# Patient Record
Sex: Female | Born: 1950 | Race: White | Hispanic: No | Marital: Married | Smoking: Former smoker
Health system: Southern US, Community
[De-identification: ages and names within clinical notes are randomized; demographics above are authoritative.]

## PROBLEM LIST (undated history)

## (undated) DIAGNOSIS — M069 Rheumatoid arthritis, unspecified: Secondary | ICD-10-CM

## (undated) DIAGNOSIS — K5792 Diverticulitis of intestine, part unspecified, without perforation or abscess without bleeding: Secondary | ICD-10-CM

## (undated) DIAGNOSIS — M2550 Pain in unspecified joint: Secondary | ICD-10-CM

## (undated) DIAGNOSIS — E785 Hyperlipidemia, unspecified: Secondary | ICD-10-CM

## (undated) DIAGNOSIS — M1990 Unspecified osteoarthritis, unspecified site: Secondary | ICD-10-CM

## (undated) DIAGNOSIS — I10 Essential (primary) hypertension: Secondary | ICD-10-CM

## (undated) DIAGNOSIS — M25561 Pain in right knee: Secondary | ICD-10-CM

## (undated) HISTORY — DX: Rheumatoid arthritis, unspecified: M06.9

## (undated) HISTORY — PX: TONSILLECTOMY: SUR1361

## (undated) HISTORY — DX: Essential (primary) hypertension: I10

## (undated) HISTORY — PX: MANDIBLE SURGERY: SHX707

## (undated) HISTORY — DX: Hyperlipidemia, unspecified: E78.5

## (undated) HISTORY — PX: TONSILLECTOMY: SHX5217

## (undated) HISTORY — PX: TUBAL LIGATION: SHX77

## (undated) HISTORY — PX: CARPAL TUNNEL RELEASE: SHX101

## (undated) HISTORY — PX: KNEE ARTHROSCOPY: SUR90

## (undated) HISTORY — DX: Pain in right knee: M25.561

## (undated) HISTORY — PX: WRIST FUSION: SHX839

## (undated) HISTORY — PX: OSTEOTOMY PELVIS BILATERAL: SUR985

## (undated) HISTORY — PX: ABDOMINAL HYSTERECTOMY: SHX81

## (undated) HISTORY — PX: KNEE ARTHROPLASTY: SHX992

## (undated) HISTORY — DX: Pain in unspecified joint: M25.50

## (undated) HISTORY — DX: Unspecified osteoarthritis, unspecified site: M19.90

---

## 1998-04-11 ENCOUNTER — Ambulatory Visit (HOSPITAL_COMMUNITY): Admission: RE | Admit: 1998-04-11 | Discharge: 1998-04-11 | Payer: Self-pay | Admitting: Family Medicine

## 1998-04-11 ENCOUNTER — Encounter: Payer: Self-pay | Admitting: Family Medicine

## 1998-07-28 ENCOUNTER — Ambulatory Visit (HOSPITAL_COMMUNITY): Admission: RE | Admit: 1998-07-28 | Discharge: 1998-07-28 | Payer: Self-pay | Admitting: General Surgery

## 1998-07-28 ENCOUNTER — Encounter: Payer: Self-pay | Admitting: General Surgery

## 1998-08-12 ENCOUNTER — Ambulatory Visit (HOSPITAL_COMMUNITY): Admission: RE | Admit: 1998-08-12 | Discharge: 1998-08-12 | Payer: Self-pay | Admitting: Family Medicine

## 1998-08-12 ENCOUNTER — Encounter: Payer: Self-pay | Admitting: Family Medicine

## 1998-10-17 ENCOUNTER — Encounter: Payer: Self-pay | Admitting: Family Medicine

## 1998-10-17 ENCOUNTER — Ambulatory Visit (HOSPITAL_COMMUNITY): Admission: RE | Admit: 1998-10-17 | Discharge: 1998-10-17 | Payer: Self-pay | Admitting: Family Medicine

## 2003-05-30 ENCOUNTER — Ambulatory Visit (HOSPITAL_COMMUNITY): Admission: RE | Admit: 2003-05-30 | Discharge: 2003-05-30 | Payer: Self-pay | Admitting: Family Medicine

## 2003-06-28 ENCOUNTER — Ambulatory Visit (HOSPITAL_COMMUNITY): Admission: RE | Admit: 2003-06-28 | Discharge: 2003-06-28 | Payer: Self-pay | Admitting: Orthopedic Surgery

## 2003-06-28 ENCOUNTER — Ambulatory Visit (HOSPITAL_BASED_OUTPATIENT_CLINIC_OR_DEPARTMENT_OTHER): Admission: RE | Admit: 2003-06-28 | Discharge: 2003-06-28 | Payer: Self-pay | Admitting: Orthopedic Surgery

## 2003-07-12 ENCOUNTER — Encounter: Admission: RE | Admit: 2003-07-12 | Discharge: 2003-09-20 | Payer: Self-pay | Admitting: Orthopedic Surgery

## 2003-09-22 ENCOUNTER — Encounter: Admission: RE | Admit: 2003-09-22 | Discharge: 2003-09-22 | Payer: Self-pay | Admitting: Family Medicine

## 2004-01-12 ENCOUNTER — Ambulatory Visit: Payer: Self-pay | Admitting: Family Medicine

## 2004-02-11 ENCOUNTER — Ambulatory Visit: Payer: Self-pay | Admitting: Family Medicine

## 2004-04-18 ENCOUNTER — Ambulatory Visit: Payer: Self-pay | Admitting: Family Medicine

## 2004-06-20 ENCOUNTER — Ambulatory Visit: Payer: Self-pay | Admitting: Family Medicine

## 2004-10-17 ENCOUNTER — Ambulatory Visit (HOSPITAL_COMMUNITY): Admission: RE | Admit: 2004-10-17 | Discharge: 2004-10-17 | Payer: Self-pay | Admitting: Family Medicine

## 2004-10-19 ENCOUNTER — Ambulatory Visit (HOSPITAL_COMMUNITY): Admission: RE | Admit: 2004-10-19 | Discharge: 2004-10-19 | Payer: Self-pay | Admitting: Family Medicine

## 2004-10-19 ENCOUNTER — Ambulatory Visit: Payer: Self-pay | Admitting: Family Medicine

## 2004-12-26 ENCOUNTER — Encounter: Admission: RE | Admit: 2004-12-26 | Discharge: 2005-01-19 | Payer: Self-pay | Admitting: Orthopedic Surgery

## 2005-03-22 ENCOUNTER — Ambulatory Visit: Payer: Self-pay | Admitting: Family Medicine

## 2006-02-01 ENCOUNTER — Ambulatory Visit: Payer: Self-pay | Admitting: Family Medicine

## 2006-02-01 ENCOUNTER — Ambulatory Visit (HOSPITAL_COMMUNITY): Admission: RE | Admit: 2006-02-01 | Discharge: 2006-02-01 | Payer: Self-pay | Admitting: Family Medicine

## 2009-12-14 ENCOUNTER — Encounter: Admission: RE | Admit: 2009-12-14 | Discharge: 2009-12-14 | Payer: Self-pay | Admitting: Family Medicine

## 2010-01-03 ENCOUNTER — Encounter: Admission: RE | Admit: 2010-01-03 | Discharge: 2010-01-03 | Payer: Self-pay | Admitting: Family Medicine

## 2010-04-04 ENCOUNTER — Other Ambulatory Visit (HOSPITAL_COMMUNITY): Payer: Self-pay | Admitting: Family Medicine

## 2010-04-04 DIAGNOSIS — M542 Cervicalgia: Secondary | ICD-10-CM

## 2010-04-12 ENCOUNTER — Ambulatory Visit (HOSPITAL_COMMUNITY)
Admission: RE | Admit: 2010-04-12 | Discharge: 2010-04-12 | Disposition: A | Discharge status: Home / Self Care | Payer: BC Managed Care – PPO | Source: Ambulatory Visit | Attending: Family Medicine | Admitting: Family Medicine

## 2010-04-12 DIAGNOSIS — R29898 Other symptoms and signs involving the musculoskeletal system: Secondary | ICD-10-CM | POA: Insufficient documentation

## 2010-04-12 DIAGNOSIS — R209 Unspecified disturbances of skin sensation: Secondary | ICD-10-CM | POA: Insufficient documentation

## 2010-04-12 DIAGNOSIS — J984 Other disorders of lung: Secondary | ICD-10-CM | POA: Insufficient documentation

## 2010-04-12 DIAGNOSIS — M47812 Spondylosis without myelopathy or radiculopathy, cervical region: Secondary | ICD-10-CM | POA: Insufficient documentation

## 2010-04-12 DIAGNOSIS — M542 Cervicalgia: Secondary | ICD-10-CM

## 2010-06-07 ENCOUNTER — Other Ambulatory Visit: Payer: Self-pay | Admitting: Family Medicine

## 2010-06-07 DIAGNOSIS — Z09 Encounter for follow-up examination after completed treatment for conditions other than malignant neoplasm: Secondary | ICD-10-CM

## 2010-06-23 NOTE — Op Note (Signed)
NAME:  Kiara Miller, Kiara Miller                          ACCOUNT NO.:  [id number]   MEDICAL RECORD NO.:  [id number]                   PATIENT TYPE:  AMB   LOCATION:  [facility]                                 FACILITY:  [facility]   PHYSICIAN:  Georges Lynch. Gioffre, M.D.             DATE OF BIRTH:  October 13, 1950   DATE OF PROCEDURE:  06/28/2003  DATE OF DISCHARGE:                                 OPERATIVE REPORT   PREOPERATIVE DIAGNOSES:  1. Degenerative arthritis to the medial compartment of the right knee.  2. Tear of the posterior horn medial meniscus, right knee.   POSTOPERATIVE DIAGNOSES:  1. Degenerative arthritis to the medial compartment of the right knee.  2. Tear of the posterior horn medial meniscus, right knee.   OPERATION:  1. Synovectomy, suprapatellar pouch -- right knee.  2. Diagnostic arthroscopy -- right knee.  3. Medial meniscectomy, posterior horn -- right knee.  4. Abrasion chondroplasty and medial femoral condyle -- right knee.   SURGEON:  Georges Lynch. Darrelyn Hillock, M.D.   ASSISTANT:  Nurse.   DESCRIPTION OF PROCEDURE:  Under local anesthesia with standby, routine  orthopedic prepping and draping of the right lower extremity is carried out.  At this time a small punctate incision is made at the suprapatellar pouch.  The inflow cannula was inserted.  The knee was distended with saline.  At  this time, small punctate incision was then made in the lateral joint and  the arthroscope was entered through the lateral approach.  We then did a  complete diagnostic arthroscopy.  I noticed that in the medial compartment  she had significant arthritic changes.  I introduced a shaver suction  device, did abrasion chondroplasty, and also went back in and noted that she  had a chronic synovitis.  I did a partial synovectomy.  Also, she has a tear  of the posterior horn of the medial meniscus.  I did a partial medial  meniscectomy.  After that I went in and looked at the cruciates.  The  cruciates are  fine.  The lateral joint was normal.  The suprapatellar pouch  had rather significant chronic synovitis.  I introduced a shaver suction  device and did a synovectomy.  I thoroughly irrigated out the knee and  removed all the fluid.  I closed all three punctate incisions with 3-0 nylon  suture.  I then injected 20 cc of 0.5% Marcaine with epinephrine into the  knee joint.  Sterile Neosporin dressings were applied.  She had 1 g Ancef  preoperatively.   The patient left the operating room in satisfactory condition.                                               Ronald A. Darrelyn Hillock, M.D.    RAG/MEDQ  D:  06/28/2003  T:  06/28/2003  Job:  [id number]

## 2010-06-28 ENCOUNTER — Ambulatory Visit
Admission: RE | Admit: 2010-06-28 | Discharge: 2010-06-28 | Disposition: A | Discharge status: Home / Self Care | Payer: BC Managed Care – PPO | Source: Ambulatory Visit | Attending: Family Medicine | Admitting: Family Medicine

## 2010-06-28 DIAGNOSIS — Z09 Encounter for follow-up examination after completed treatment for conditions other than malignant neoplasm: Secondary | ICD-10-CM

## 2010-11-24 ENCOUNTER — Other Ambulatory Visit: Payer: Self-pay | Admitting: Family Medicine

## 2010-11-24 DIAGNOSIS — Z1231 Encounter for screening mammogram for malignant neoplasm of breast: Secondary | ICD-10-CM

## 2010-11-30 ENCOUNTER — Telehealth: Payer: Self-pay | Admitting: Cardiovascular Disease

## 2010-11-30 NOTE — Telephone Encounter (Signed)
Did move appointment up to 10/30.  Advised if chest pains or worse needs to go to emergency department

## 2010-11-30 NOTE — Telephone Encounter (Signed)
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

## 2010-12-04 ENCOUNTER — Encounter: Payer: Self-pay | Admitting: Cardiovascular Disease

## 2010-12-05 ENCOUNTER — Ambulatory Visit (INDEPENDENT_AMBULATORY_CARE_PROVIDER_SITE_OTHER): Payer: BC Managed Care – PPO | Admitting: Cardiovascular Disease

## 2010-12-05 ENCOUNTER — Encounter: Payer: Self-pay | Admitting: Cardiovascular Disease

## 2010-12-05 VITALS — BP 119/69 | HR 65 | Ht 61.5 in | Wt 189.1 lb

## 2010-12-05 DIAGNOSIS — R0600 Dyspnea, unspecified: Secondary | ICD-10-CM | POA: Insufficient documentation

## 2010-12-05 DIAGNOSIS — R0989 Other specified symptoms and signs involving the circulatory and respiratory systems: Secondary | ICD-10-CM

## 2010-12-05 DIAGNOSIS — R002 Palpitations: Secondary | ICD-10-CM

## 2010-12-05 MED ORDER — LOSARTAN POTASSIUM-HCTZ 100-25 MG PO TABS: 1.0000 | ORAL_TABLET | Freq: Every day | ORAL | Status: AC

## 2010-12-05 NOTE — Assessment & Plan Note (Signed)
She complains of having palpitations when she has checked her heart rate her heart rate only in the 80-100 range. I do not think that she's having any significant arrhythmia such as supraventricular tachycardia or atrial fibrillation.

## 2010-12-05 NOTE — Assessment & Plan Note (Signed)
It was a Kiara Miller presents with episodes of dyspnea on exertion as well as palpitations. At like to get an echocardiogram for further evaluation of her cardiac function.  I think it is also reasonable to do a stress Myoview study. Has a long history of cigarette smoking and may have some ischemic heart disease.  I'll see her back in the office in several months for followup visit.

## 2010-12-05 NOTE — Patient Instructions (Signed)
Your physician wants you to follow-up in: 3 months You will receive a reminder letter in the mail two months in advance. If you don't receive a letter, please call our office to schedule the follow-up appointment.  Your physician has requested that you have a lexiscan myoview. For further information please visit [email or web address] Please follow instruction sheet, as given.   Your physician has requested that you have an echocardiogram. Echocardiography is a painless test that uses sound waves to create images of your heart. It provides your doctor with information about the size and shape of your heart and how well your heart's chambers and valves are working. This procedure takes approximately one hour. There are no restrictions for this procedure.

## 2010-12-05 NOTE — Progress Notes (Signed)
Kiara Miller Date of Birth  17-Aug-1950 [facility] [address]    [address] [place], [place]  [address] [phone]  Fax  [phone]  History of Present Illness:  60 year old female pitch has a history of hypertension. She's been having progressive shortness of breath and generalized fatigue for the past several months. Last week  she had an episode of tachycardia / palpitations.  Is associated with some worsening shortness of breath.  Somewhat worsened with exertion.  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  On occasion she was short of breath but her O2 Sate was 98.%.  In addition, her HR ranges frm 80-100 when she is having the palpitations.   She does routine yard work without significant problems.  Has a history of hyperlipidemia but is intolerant to statins.  Current Outpatient Prescriptions on File Prior to Visit  Medication Sig Dispense Refill  . aspirin 81 MG tablet Take 81 mg by mouth daily.        Marland Kitchen losartan-hydrochlorothiazide (HYZAAR) 100-25 MG per tablet Take 1 tablet by mouth. Taking 1/2 Tablet Once a Day       . metoprolol tartrate (LOPRESSOR) 25 MG tablet Take by mouth 2 (two) times daily. Taking 1/2 Tablet       . montelukast (SINGULAIR) 10 MG tablet Take 10 mg by mouth at bedtime.        . Omega-3 Fatty Acids (FISH OIL) 1200 MG CAPS Take 1,200 mg by mouth 2 (two) times daily.          Allergies  Allergen Reactions  . Statins     Past Medical History  Diagnosis Date  . Hypertension   . Hyperlipidemia   . Joint pain   . Right knee pain   . Polyarthralgia     Past Surgical History  Procedure Date  . Tonsillectomy   . Tubal ligation   . Mandible surgery   . Osteotomy pelvis bilateral   . Carpal tunnel release     History  Smoking status  . Current Everyday Smoker -- 0.5 packs/day  . Types: Cigarettes  Smokeless tobacco  . Not on file    History  Alcohol Use No     Family History  Problem Relation Age of Onset  . Hypertension Father     Reviw of Systems:  Reviewed in the HPI.  All other systems are negative.  Physical Exam: BP 119/69  Pulse 65  Ht 5' 1.5" (1.562 m)  Wt 189 lb 1.9 oz (85.784 kg)  BMI 35.16 kg/m2 The patient is alert and oriented x 3.  The mood and affect are normal.   Skin: warm and dry.  Color is normal.    HEENT:   Pupils carotids. She has no JVD  Lungs: Lungs are clear.   Heart: Regular rate, S1-S2.   Abdomen: Her abdomen is soft. She has good bowel sounds.  Extremities:  No clubbing cyanosis or edema.  Neuro:  Nonfocal     ECG:  Assessment / Plan:

## 2010-12-07 HISTORY — PX: TRANSTHORACIC ECHOCARDIOGRAM: SHX275

## 2010-12-14 ENCOUNTER — Ambulatory Visit (HOSPITAL_COMMUNITY): Payer: BC Managed Care – PPO | Attending: Internal Medicine | Admitting: Radiology

## 2010-12-14 ENCOUNTER — Ambulatory Visit (HOSPITAL_COMMUNITY): Payer: BC Managed Care – PPO | Attending: Internal Medicine

## 2010-12-14 VITALS — Ht 61.0 in | Wt 187.0 lb

## 2010-12-14 DIAGNOSIS — R0989 Other specified symptoms and signs involving the circulatory and respiratory systems: Secondary | ICD-10-CM | POA: Insufficient documentation

## 2010-12-14 DIAGNOSIS — R5383 Other fatigue: Secondary | ICD-10-CM

## 2010-12-14 DIAGNOSIS — I10 Essential (primary) hypertension: Secondary | ICD-10-CM | POA: Insufficient documentation

## 2010-12-14 DIAGNOSIS — R0609 Other forms of dyspnea: Secondary | ICD-10-CM | POA: Insufficient documentation

## 2010-12-14 DIAGNOSIS — R002 Palpitations: Secondary | ICD-10-CM | POA: Insufficient documentation

## 2010-12-14 DIAGNOSIS — R5381 Other malaise: Secondary | ICD-10-CM | POA: Insufficient documentation

## 2010-12-14 DIAGNOSIS — I079 Rheumatic tricuspid valve disease, unspecified: Secondary | ICD-10-CM | POA: Insufficient documentation

## 2010-12-14 DIAGNOSIS — R0602 Shortness of breath: Secondary | ICD-10-CM

## 2010-12-14 DIAGNOSIS — E669 Obesity, unspecified: Secondary | ICD-10-CM | POA: Insufficient documentation

## 2010-12-14 DIAGNOSIS — F17200 Nicotine dependence, unspecified, uncomplicated: Secondary | ICD-10-CM | POA: Insufficient documentation

## 2010-12-14 DIAGNOSIS — R110 Nausea: Secondary | ICD-10-CM | POA: Insufficient documentation

## 2010-12-14 DIAGNOSIS — R55 Syncope and collapse: Secondary | ICD-10-CM | POA: Insufficient documentation

## 2010-12-14 DIAGNOSIS — R0789 Other chest pain: Secondary | ICD-10-CM | POA: Insufficient documentation

## 2010-12-14 DIAGNOSIS — E785 Hyperlipidemia, unspecified: Secondary | ICD-10-CM | POA: Insufficient documentation

## 2010-12-14 DIAGNOSIS — R42 Dizziness and giddiness: Secondary | ICD-10-CM | POA: Insufficient documentation

## 2010-12-14 DIAGNOSIS — R0600 Dyspnea, unspecified: Secondary | ICD-10-CM

## 2010-12-14 DIAGNOSIS — R000 Tachycardia, unspecified: Secondary | ICD-10-CM | POA: Insufficient documentation

## 2010-12-14 MED ORDER — TECHNETIUM TC 99M TETROFOSMIN IV KIT
11.0000 | PACK | Freq: Once | INTRAVENOUS | Status: AC | PRN
  Administered 2010-12-14: 11 via INTRAVENOUS

## 2010-12-14 MED ORDER — REGADENOSON 0.4 MG/5ML IV SOLN
0.4000 mg | Freq: Once | INTRAVENOUS | Status: AC
  Administered 2010-12-14: 0.4 mg via INTRAVENOUS

## 2010-12-14 MED ORDER — TECHNETIUM TC 99M TETROFOSMIN IV KIT
33.0000 | PACK | Freq: Once | INTRAVENOUS | Status: AC | PRN
  Administered 2010-12-14: 33 via INTRAVENOUS

## 2010-12-14 NOTE — Progress Notes (Signed)
[facility] SITE 3 NUCLEAR MED [address] [place] [address] [phone]  Cardiology Nuclear Med Study  Kiara Miller is a 60 y.o. female [id number] [birth year]   Nuclear Med Background Indication for Stress Test:  Evaluation for Ischemia History: 1980's GXT:OK per patient Cardiac Risk Factors: Hypertension, Lipids, Obesity and Smoker  Symptoms:  Chest Pressure with and without Exertion (last episode of chest discomfort was yesterday), Dizziness, DOE/SOB, Fatigue with and without Exertion, Light-Headedness, Nausea, Near Syncope, Palpitations, Rapid HR    Nuclear Pre-Procedure Caffeine/Decaff Intake:  None NPO After: 8:00pm   Lungs:  Clear.  O2 SAT 97% on RA. IV 0.9% NS with Angio Cath:  22g  IV Site: R Antecubital x 1, tolerated well IV Started by:  Irean Hong, RN  Chest Size (in):  42 Cup Size: DD  Height: 5\' 1"  (1.549 m)  Weight:  187 lb (84.823 kg)  BMI:  Body mass index is 35.33 kg/(m^2). Tech Comments:  metoprolol held x 24 hours    Nuclear Med Study 1 or 2 day study: 1 day  Stress Test Type:  Treadmill/Lexiscan  Reading MD: Dietrich Pates, MD  Order Authorizing Provider:  Kristeen Miss, MD  Resting Radionuclide: Technetium 17m Tetrofosmin  Resting Radionuclide Dose: 11.0 mCi   Stress Radionuclide:  Technetium 91m Tetrofosmin  Stress Radionuclide Dose: 33.0 mCi           Stress Protocol Rest HR: 66 Stress HR: 130  Rest BP: 104/51 Stress BP: 119/60  Exercise Time (min): 2:00 METS: n/a   Predicted Max HR: 159 bpm % Max HR: 81.76 bpm Rate Pressure Product: 13244   Dose of Adenosine (mg):  n/a Dose of Lexiscan: 0.4 mg  Dose of Atropine (mg): n/a Dose of Dobutamine: n/a mcg/kg/min (at max HR)  Stress Test Technologist: Smiley Houseman, CMA-N  Nuclear Technologist:  Domenic Polite, CNMT     Rest Procedure:  Myocardial perfusion imaging was performed at rest 45 minutes following the intravenous administration of Technetium 64m  Tetrofosmin.  Rest ECG: Poor R-wave progression.  Stress Procedure:  The patient received IV Lexiscan 0.4 mg over 15-seconds with concurrent low level exercise and then Technetium 79m Tetrofosmin was injected at 30-seconds while the patient continued walking one more minute.  There was nonspecific, rate related, ST-segment depression noted with Lexiscan.  Quantitative spect images were obtained after a 45-minute delay.  Stress ECG: No significant change from baseline ECG  QPS Raw Data Images:  Normal; no motion artifact; normal heart/lung ratio. Stress Images:  Normal homogeneous uptake in all areas of the myocardium. Rest Images:  Normal homogeneous uptake in all areas of the myocardium. Subtraction (SDS):  No evidence of ischemia. Transient Ischemic Dilatation (Normal <1.22):  0.91 Lung/Heart Ratio (Normal <0.45):  0.40  Quantitative Gated Spect Images QGS EDV:  42 ml QGS ESV:  4 ml QGS cine images:  NL LV Function; NL Wall Motion QGS EF: 89%  Impression Exercise Capacity:  Lexiscan with low level exercise. BP Response:  Normal blood pressure response. Clinical Symptoms:  No chest pain. ECG Impression:  No significant ST segment change suggestive of ischemia. Comparison with Prior Nuclear Study: No previous nuclear study performed  Overall Impression:  Normal stress nuclear study. Dietrich Pates

## 2010-12-15 ENCOUNTER — Ambulatory Visit: Payer: BC Managed Care – PPO | Admitting: Cardiovascular Disease

## 2010-12-18 ENCOUNTER — Ambulatory Visit
Admission: RE | Admit: 2010-12-18 | Discharge: 2010-12-18 | Disposition: A | Discharge status: Home / Self Care | Payer: BC Managed Care – PPO | Source: Ambulatory Visit | Attending: Family Medicine | Admitting: Family Medicine

## 2010-12-18 DIAGNOSIS — Z1231 Encounter for screening mammogram for malignant neoplasm of breast: Secondary | ICD-10-CM

## 2011-01-05 ENCOUNTER — Telehealth: Payer: Self-pay | Admitting: Cardiovascular Disease

## 2011-01-05 NOTE — Telephone Encounter (Signed)
LOv,Stress faxed to [facility]/Dr.Leonard Nyland @ [phone]  01/05/11/km

## 2011-02-26 ENCOUNTER — Ambulatory Visit: Payer: BC Managed Care – PPO | Admitting: Cardiovascular Disease

## 2011-12-17 ENCOUNTER — Other Ambulatory Visit: Payer: Self-pay | Admitting: Family Medicine

## 2011-12-17 DIAGNOSIS — Z1231 Encounter for screening mammogram for malignant neoplasm of breast: Secondary | ICD-10-CM

## 2012-01-15 ENCOUNTER — Ambulatory Visit
Admission: RE | Admit: 2012-01-15 | Discharge: 2012-01-15 | Disposition: A | Discharge status: Home / Self Care | Payer: BC Managed Care – PPO | Source: Ambulatory Visit | Attending: Family Medicine | Admitting: Family Medicine

## 2012-01-15 DIAGNOSIS — Z1231 Encounter for screening mammogram for malignant neoplasm of breast: Secondary | ICD-10-CM

## 2012-07-02 ENCOUNTER — Other Ambulatory Visit (HOSPITAL_COMMUNITY): Payer: Self-pay | Admitting: Rheumatology

## 2012-07-02 ENCOUNTER — Ambulatory Visit (HOSPITAL_COMMUNITY)
Admission: RE | Admit: 2012-07-02 | Discharge: 2012-07-02 | Disposition: A | Discharge status: Home / Self Care | Payer: BC Managed Care – PPO | Source: Ambulatory Visit | Attending: Rheumatology | Admitting: Rheumatology

## 2012-07-02 DIAGNOSIS — T50905A Adverse effect of unspecified drugs, medicaments and biological substances, initial encounter: Secondary | ICD-10-CM

## 2012-07-02 DIAGNOSIS — M129 Arthropathy, unspecified: Secondary | ICD-10-CM | POA: Insufficient documentation

## 2012-07-02 DIAGNOSIS — Z79899 Other long term (current) drug therapy: Secondary | ICD-10-CM | POA: Insufficient documentation

## 2012-07-02 DIAGNOSIS — T887XXA Unspecified adverse effect of drug or medicament, initial encounter: Secondary | ICD-10-CM | POA: Insufficient documentation

## 2012-07-02 DIAGNOSIS — I10 Essential (primary) hypertension: Secondary | ICD-10-CM | POA: Insufficient documentation

## 2012-12-23 ENCOUNTER — Other Ambulatory Visit: Payer: Self-pay

## 2012-12-23 DIAGNOSIS — Z1231 Encounter for screening mammogram for malignant neoplasm of breast: Secondary | ICD-10-CM

## 2013-01-23 ENCOUNTER — Ambulatory Visit
Admission: RE | Admit: 2013-01-23 | Discharge: 2013-01-23 | Disposition: A | Discharge status: Home / Self Care | Payer: BC Managed Care – PPO | Source: Ambulatory Visit

## 2013-01-23 DIAGNOSIS — Z1231 Encounter for screening mammogram for malignant neoplasm of breast: Secondary | ICD-10-CM

## 2013-12-28 ENCOUNTER — Other Ambulatory Visit: Payer: Self-pay

## 2013-12-28 DIAGNOSIS — Z1231 Encounter for screening mammogram for malignant neoplasm of breast: Secondary | ICD-10-CM

## 2014-02-16 ENCOUNTER — Ambulatory Visit
Admission: RE | Admit: 2014-02-16 | Discharge: 2014-02-16 | Disposition: A | Discharge status: Home / Self Care | Payer: BLUE CROSS/BLUE SHIELD | Source: Ambulatory Visit

## 2014-02-16 DIAGNOSIS — Z1231 Encounter for screening mammogram for malignant neoplasm of breast: Secondary | ICD-10-CM

## 2014-11-11 ENCOUNTER — Other Ambulatory Visit (HOSPITAL_COMMUNITY): Payer: Self-pay | Admitting: Rheumatology

## 2014-11-11 DIAGNOSIS — M25571 Pain in right ankle and joints of right foot: Secondary | ICD-10-CM

## 2014-11-17 ENCOUNTER — Ambulatory Visit (HOSPITAL_COMMUNITY)
Admission: RE | Admit: 2014-11-17 | Discharge: 2014-11-17 | Disposition: A | Discharge status: Home / Self Care | Payer: Medicare Other | Source: Ambulatory Visit | Attending: Rheumatology | Admitting: Rheumatology

## 2014-11-17 ENCOUNTER — Other Ambulatory Visit (HOSPITAL_COMMUNITY): Payer: Self-pay | Admitting: Rheumatology

## 2014-11-17 DIAGNOSIS — M722 Plantar fascial fibromatosis: Secondary | ICD-10-CM | POA: Insufficient documentation

## 2014-11-17 DIAGNOSIS — M25571 Pain in right ankle and joints of right foot: Secondary | ICD-10-CM | POA: Insufficient documentation

## 2014-11-17 DIAGNOSIS — M19072 Primary osteoarthritis, left ankle and foot: Secondary | ICD-10-CM | POA: Diagnosis not present

## 2014-12-08 ENCOUNTER — Ambulatory Visit: Payer: Medicare Other | Attending: Orthopedic Surgery | Admitting: Physical Therapy

## 2014-12-08 DIAGNOSIS — M25571 Pain in right ankle and joints of right foot: Secondary | ICD-10-CM | POA: Insufficient documentation

## 2014-12-08 DIAGNOSIS — M25572 Pain in left ankle and joints of left foot: Secondary | ICD-10-CM | POA: Diagnosis present

## 2014-12-08 DIAGNOSIS — M25672 Stiffness of left ankle, not elsewhere classified: Secondary | ICD-10-CM | POA: Diagnosis present

## 2014-12-08 DIAGNOSIS — M25472 Effusion, left ankle: Secondary | ICD-10-CM | POA: Diagnosis present

## 2014-12-08 DIAGNOSIS — R6889 Other general symptoms and signs: Secondary | ICD-10-CM | POA: Insufficient documentation

## 2014-12-08 NOTE — Therapy (Signed)
[facility] [address] [place], [place], [address] Phone: [phone]   Fax:  [phone]  Physical Therapy Evaluation  Patient Details  Name: Kiara Miller MRN: [id number] Date of Birth: 1950-04-24 Referring Provider: Toni Arthurs, MD  Encounter Date: 12/08/2014      PT End of Session - 12/08/14 1051    Visit Number 1   Number of Visits 12   Date for PT Re-Evaluation 01/05/15   PT Start Time 1052   PT Stop Time 1138   PT Time Calculation (min) 46 min   Activity Tolerance Patient tolerated treatment well   Behavior During Therapy [facility] for tasks assessed/performed      Past Medical History  Diagnosis Date  . Hypertension   . Hyperlipidemia   . Joint pain   . Right knee pain   . Polyarthralgia     Past Surgical History  Procedure Laterality Date  . Tonsillectomy    . Tubal ligation    . Mandible surgery    . Osteotomy pelvis bilateral    . Carpal tunnel release      There were no vitals filed for this visit.  Visit Diagnosis:  Pain in left ankle  Pain in right ankle  Stiffness of left ankle joint  Activity intolerance  Swelling of left ankle joint      Subjective Assessment - 12/08/14 1046    Subjective Patient had insidious onset of L ankle pain about 10 months ago. Presented as Plantar facitis although toes were huring too. Now the right ankle is starting. She had PF on left last year and it resolved.   Pertinent History autoimmune, thryoid, RA, OA (Rt knee severe)   Limitations Walking   How long can you stand comfortably? not at all   How long can you walk comfortably? not at all   Diagnostic tests MRI shows Plantar fascia of medial cord, sinus tarsi syndrome,  mild degeneration  middle facet of subtalar jt, flexor digitorum tendinosis   Patient Stated Goals Decrease pain, stand longer   Currently in Pain? Yes   Pain Score 3    Pain Location Ankle   Pain Orientation Left;Medial  into plantar fascia  and along medial foot   Pain Descriptors / Indicators Aching;Burning   Pain Radiating Towards toes   Pain Onset More than a month ago   Aggravating Factors  standing and walking   Pain Relieving Factors rest and elevation   Effect of Pain on Daily Activities limited standing and walking   Multiple Pain Sites Yes   Pain Score 5   Pain Location Ankle   Pain Orientation Right;Lateral   Pain Descriptors / Indicators Stabbing   Pain Type Acute pain   Pain Onset More than a month ago   Pain Frequency Constant   Aggravating Factors  same   Pain Relieving Factors same            [facility] PT Assessment - 12/08/14 0001    Assessment   Medical Diagnosis chronic pain of L ankle   Referring Provider Toni Arthurs, MD   Onset Date/Surgical Date 04/07/14   Next MD Visit 4 months   Precautions   Precaution Comments wears brace on left   Balance Screen   Has the patient fallen in the past 6 months No   Has the patient had a decrease in activity level because of a fear of falling?  No   Is the patient reluctant to leave their home because of  a fear of falling?  No   Prior Function   Level of Independence Independent;Other (comment)  SPC for gait   Observation/Other Assessments   Focus on Therapeutic Outcomes (FOTO)  67% limited   Observation/Other Assessments-Edema    Edema Figure 8   Figure 8 Edema   Figure 8 - Right  52 cm   Figure 8 - Left  53 cm   Posture/Postural Control   Posture Comments pronation bil , L> R   ROM / Strength   AROM / PROM / Strength AROM;Strength   AROM   AROM Assessment Site Ankle   Right/Left Ankle Right;Left   Right Ankle Dorsiflexion 5  11   Right Ankle Plantar Flexion 62   Right Ankle Inversion 34   Right Ankle Eversion 30  17   Left Ankle Dorsiflexion -2  11   Left Ankle Plantar Flexion 56   Left Ankle Inversion 22  40   Left Ankle Eversion 24  28   Strength   Overall Strength Comments Left ankle 5/5 except PF unable to raise heel and Inv 5-/5;  L ankle 5/5 except inv 5-/5   Palpation   Palpation comment mild tenderness of L medial great toe; L plantar fascia                   [facility] Adult PT Treatment/Exercise - 12/08/14 0001    Modalities   Modalities Iontophoresis   Iontophoresis   Type of Iontophoresis Dexamethasone   Location Left medial ankle   Dose 4mg /ml   Time 2.5 hour patch                PT Education - 12/08/14 1141    Education provided Yes   Education Details continue with HEP; ice 2-3x/day   Person(s) Educated Patient   Methods Explanation   Comprehension Verbalized understanding          PT Short Term Goals - 12/08/14 1158    PT SHORT TERM GOAL #1   Title ALL STG=LTG           PT Long Term Goals - 12/08/14 1158    PT LONG TERM GOAL #1   Title I with HEP   Time 4   Period Weeks   Status New   PT LONG TERM GOAL #2   Title able to tolerate standing and waking for 30 minutes with reported  minimal pain.   Time 4   Period Weeks   Status New   PT LONG TERM GOAL #3   Title able to ambulate without AD   Time 4   Period Weeks   Status New   PT LONG TERM GOAL #4   Title Improve L ankle DF to 5 degrees or better to help normalize gait   Time 4   Period Weeks   Status New   PT LONG TERM GOAL #5   Title Able to perform single leg heel raise on L   Time 4   Period Weeks   Status New               Plan - 12/08/14 1142    Clinical Impression Statement Patient presents with insidious onset of bil ankle pain L>R starting 10 months limiting her standing and walking tolerance. She presently uses a SPC in the RUE due to pain. She recently acquired orthotics which are helping somewhat.  MRI shows tendinosis and PF on the left as well sinus tarsi. Her R ankle is affected  laterally.   Pt will benefit from skilled therapeutic intervention in order to improve on the following deficits Difficulty walking;Pain;Decreased strength;Increased edema;Postural dysfunction   Rehab  Potential Good   PT Frequency 2x / week   PT Duration 4 weeks   PT Treatment/Interventions ADLs/Self Care Home Management;Cryotherapy;Electrical Stimulation;Iontophoresis 4mg /ml Dexamethasone;Moist Heat;Therapeutic exercise;Gait training;Ultrasound;Neuromuscular re-education;Patient/family education;Manual techniques;Vasopneumatic Device;Passive range of motion   PT Next Visit Plan Assess ionto and continue to L medial ankle; to L med and R lat ankle, STW/Man   PT Home Exercise Plan ankle pumps, inv/ever, continue with toe scrunches, gastroc stretch   Consulted and Agree with Plan of Care Patient          G-Codes - 12-23-14 1203    Functional Assessment Tool Used FOTO = 67% LIMITED   Functional Limitation Mobility: Walking and moving around   Mobility: Walking and Moving Around Current Status [phone]) At least 60 percent but less than 80 percent impaired, limited or restricted   Mobility: Walking and Moving Around Goal Status [phone]) At least 40 percent but less than 60 percent impaired, limited or restricted       Problem List Patient Active Problem List   Diagnosis Date Noted  . Dyspnea 12/05/2010  . Palpitations 12/05/2010    12/07/2010 PT  2014-12-23, 12:10 PM  [facility] [address] [place], [place], [place] Phone: [phone]   Fax:  [phone]  Name: Kiara Miller MRN: [name] Date of Birth: February 13, 1950

## 2014-12-10 ENCOUNTER — Ambulatory Visit: Payer: Medicare Other | Admitting: *Deleted

## 2014-12-10 ENCOUNTER — Encounter: Payer: Self-pay | Admitting: *Deleted

## 2014-12-10 DIAGNOSIS — M25571 Pain in right ankle and joints of right foot: Secondary | ICD-10-CM

## 2014-12-10 DIAGNOSIS — M25472 Effusion, left ankle: Secondary | ICD-10-CM

## 2014-12-10 DIAGNOSIS — M25572 Pain in left ankle and joints of left foot: Secondary | ICD-10-CM | POA: Diagnosis not present

## 2014-12-10 DIAGNOSIS — M25672 Stiffness of left ankle, not elsewhere classified: Secondary | ICD-10-CM

## 2014-12-10 DIAGNOSIS — R6889 Other general symptoms and signs: Secondary | ICD-10-CM

## 2014-12-10 NOTE — Therapy (Signed)
[facility] [address] [place], [place], [address] Phone: [phone]   Fax:  [phone]  Physical Therapy Treatment  Patient Details  Name: Kiara Miller MRN: [id number] Date of Birth: Apr 17, 1950 Referring Provider: Toni Arthurs, MD  Encounter Date: 12/10/2014      PT End of Session - 12/10/14 1004    Visit Number 2   Number of Visits 12   Date for PT Re-Evaluation 01/05/15   PT Start Time 0945   PT Stop Time 1034   PT Time Calculation (min) 49 min      Past Medical History  Diagnosis Date  . Hypertension   . Hyperlipidemia   . Joint pain   . Right knee pain   . Polyarthralgia     Past Surgical History  Procedure Laterality Date  . Tonsillectomy    . Tubal ligation    . Mandible surgery    . Osteotomy pelvis bilateral    . Carpal tunnel release      There were no vitals filed for this visit.  Visit Diagnosis:  Pain in left ankle  Pain in right ankle  Stiffness of left ankle joint  Activity intolerance  Swelling of left ankle joint      Subjective Assessment - 12/10/14 0952    Subjective Patient had insidious onset of L ankle pain about 10 months ago. Presented as Plantar facitis although toes were huring too. Now the right ankle is starting. She had PF on left last year and it resolved.   Pertinent History autoimmune, thryoid, RA, OA (Rt knee severe)   Limitations Walking   How long can you stand comfortably? not at all   How long can you walk comfortably? not at all   Diagnostic tests MRI shows Plantar fascia of medial cord, sinus tarsi syndrome,  mild degeneration  middle facet of subtalar jt, flexor digitorum tendinosis   Patient Stated Goals Decrease pain, stand longer   Currently in Pain? Yes   Pain Score 3   PAIN with standing 8-9/10   Pain Location Ankle   Pain Orientation Left;Medial   Pain Descriptors / Indicators Aching;Burning   Pain Onset More than a month ago   Aggravating Factors   standing and walking   Pain Score 5   Pain Location Ankle   Pain Orientation Right;Lateral   Pain Descriptors / Indicators Stabbing   Pain Type Acute pain   Pain Onset More than a month ago                         [facility] Adult PT Treatment/Exercise - 12/10/14 0001    Modalities   Modalities Iontophoresis;Ultrasound;Office manager Location RT ankle LAT, and LT ankle Medial   Electrical Stimulation Action premod x 15 mins 1-10hz    Electrical Stimulation Goals Pain;Edema   Ultrasound   Ultrasound Location LT medial malleoli and RT Lateral malleoli   Ultrasound Parameters 1.2 w/cm2 x 16 mins   Ultrasound Goals Pain   Iontophoresis   Type of Iontophoresis Dexamethasone   Location Left medial ankle   Dose 56ml 80 MA/min dose   Time 4 hour patch   Vasopneumatic   Number Minutes Vasopneumatic  15 minutes   Vasopnuematic Location  Ankle   Vasopneumatic Pressure Low   Vasopneumatic Temperature  36                  PT Short Term Goals -  12/08/14 1158    PT SHORT TERM GOAL #1   Title ALL STG=LTG           PT Long Term Goals - 12/08/14 1158    PT LONG TERM GOAL #1   Title I with HEP   Time 4   Period Weeks   Status New   PT LONG TERM GOAL #2   Title able to tolerate standing and waking for 30 minutes with reported  minimal pain.   Time 4   Period Weeks   Status New   PT LONG TERM GOAL #3   Title able to ambulate without AD   Time 4   Period Weeks   Status New   PT LONG TERM GOAL #4   Title Improve L ankle DF to 5 degrees or better to help normalize gait   Time 4   Period Weeks   Status New   PT LONG TERM GOAL #5   Title Able to perform single leg heel raise on L   Time 4   Period Weeks   Status New               Problem List Patient Active Problem List   Diagnosis Date Noted  . Dyspnea 12/05/2010  . Palpitations 12/05/2010    Gauge Winski,CHRIS,  PTA 12/10/2014, 12:05 PM  [facility] [address] [place], [place], [address] Phone: [phone]   Fax:  [phone]  Name: Kiara Miller MRN: [id number] Date of Birth: 1951/01/24

## 2014-12-14 ENCOUNTER — Ambulatory Visit: Payer: Medicare Other | Admitting: *Deleted

## 2014-12-14 ENCOUNTER — Encounter: Payer: Self-pay | Admitting: *Deleted

## 2014-12-14 DIAGNOSIS — M25672 Stiffness of left ankle, not elsewhere classified: Secondary | ICD-10-CM

## 2014-12-14 DIAGNOSIS — R6889 Other general symptoms and signs: Secondary | ICD-10-CM

## 2014-12-14 DIAGNOSIS — M25572 Pain in left ankle and joints of left foot: Secondary | ICD-10-CM

## 2014-12-14 DIAGNOSIS — M25472 Effusion, left ankle: Secondary | ICD-10-CM

## 2014-12-14 DIAGNOSIS — M25571 Pain in right ankle and joints of right foot: Secondary | ICD-10-CM

## 2014-12-14 NOTE — Therapy (Signed)
[facility] [address] [place], [place], [address] Phone: [phone]   Fax:  [phone]  Physical Therapy Treatment  Patient Details  Name: Kiara Miller MRN: [id number] Date of Birth: 11-Nov-1950 Referring Provider: Toni Arthurs, MD  Encounter Date: 12/14/2014      PT End of Session - 12/14/14 1147    Visit Number 3   Number of Visits 12   Date for PT Re-Evaluation 01/05/15   PT Start Time 1030   PT Stop Time 1120   PT Time Calculation (min) 50 min      Past Medical History  Diagnosis Date  . Hypertension   . Hyperlipidemia   . Joint pain   . Right knee pain   . Polyarthralgia     Past Surgical History  Procedure Laterality Date  . Tonsillectomy    . Tubal ligation    . Mandible surgery    . Osteotomy pelvis bilateral    . Carpal tunnel release      There were no vitals filed for this visit.  Visit Diagnosis:  Pain in left ankle  Pain in right ankle  Stiffness of left ankle joint  Activity intolerance  Swelling of left ankle joint      Subjective Assessment - 12/14/14 1043    Subjective Patient had insidious onset of L ankle pain about 10 months ago. Presented as Plantar facitis although toes were huring too. Now the right ankle is starting. She had PF on left last year and it resolved.   Pertinent History autoimmune, thryoid, RA, OA (Rt knee severe)   Limitations Walking   How long can you stand comfortably? not at all   How long can you walk comfortably? not at all   Diagnostic tests MRI shows Plantar fascia of medial cord, sinus tarsi syndrome,  mild degeneration  middle facet of subtalar jt, flexor digitorum tendinosis   Patient Stated Goals Decrease pain, stand longer   Currently in Pain? Yes   Pain Score 2   Walking pain 8-9/10   Pain Location Ankle   Pain Orientation Left   Pain Descriptors / Indicators Aching;Burning   Pain Onset More than a month ago   Aggravating Factors  standing and  walking   Pain Relieving Factors rest and elevation   Pain Score 2   Pain Location Ankle   Pain Orientation Right;Lateral   Pain Descriptors / Indicators Aching;Burning   Pain Type Acute pain   Pain Onset More than a month ago   Pain Frequency Constant   Aggravating Factors  same   Pain Relieving Factors same                         [facility] Adult PT Treatment/Exercise - 12/14/14 0001    Modalities   Modalities Iontophoresis;Ultrasound;Office manager Location RT ankle LAT, and LT ankle Medial Premod 80-150hz  x15 mins   Electrical Stimulation Goals Pain;Edema   Ultrasound   Ultrasound Location LT plantar fascia and around medial malleoli and RT foot  lateral malleoli   Ultrasound Parameters 1.2 w/cm2, 3.3 w/cm2 x18 mins, 10 mins LT, 8 mins RT    Ultrasound Goals Pain   Iontophoresis   Type of Iontophoresis Dexamethasone   Location Left medial ankle   Dose 49ml 80 MA/min dose   Time 4 hour patch   Manual Therapy   Manual Therapy Soft tissue mobilization;Myofascial release   Myofascial Release IASTM  and STW to LT plantar fascia and around Medial malleolus                  PT Short Term Goals - 12/08/14 1158    PT SHORT TERM GOAL #1   Title ALL STG=LTG           PT Long Term Goals - 12/08/14 1158    PT LONG TERM GOAL #1   Title I with HEP   Time 4   Period Weeks   Status New   PT LONG TERM GOAL #2   Title able to tolerate standing and waking for 30 minutes with reported  minimal pain.   Time 4   Period Weeks   Status New   PT LONG TERM GOAL #3   Title able to ambulate without AD   Time 4   Period Weeks   Status New   PT LONG TERM GOAL #4   Title Improve L ankle DF to 5 degrees or better to help normalize gait   Time 4   Period Weeks   Status New   PT LONG TERM GOAL #5   Title Able to perform single leg heel raise on L   Time 4   Period Weeks   Status New                Plan - 12/14/14 1148    Clinical Impression Statement Pt did fair with Rx today. She chose not to have Vaso Rx today due to cold makes her toes hurt. She is still very tender around LT medial malleolus and into Plantar Fascia. Her RT Lateral malleolus area was also sore with palpation and had mild swelloing Anterior aspect.. Goals are ongoing   Pt will benefit from skilled therapeutic intervention in order to improve on the following deficits Difficulty walking;Pain;Decreased strength;Increased edema;Postural dysfunction   Rehab Potential Good   PT Frequency 2x / week   PT Duration 4 weeks   PT Treatment/Interventions ADLs/Self Care Home Management;Cryotherapy;Electrical Stimulation;Iontophoresis 4mg /ml Dexamethasone;Moist Heat;Therapeutic exercise;Gait training;Ultrasound;Neuromuscular re-education;Patient/family education;Manual techniques;Vasopneumatic Device;Passive range of motion   PT Next Visit Plan Assess ionto and continue to L medial ankle; to L med and R lat ankle, STW/Man   PT Home Exercise Plan ankle pumps, inv/ever, continue with toe scrunches, gastroc stretch   Consulted and Agree with Plan of Care Patient        Problem List Patient Active Problem List   Diagnosis Date Noted  . Dyspnea 12/05/2010  . Palpitations 12/05/2010    Shakera Ebrahimi,CHRIS, PTA 12/14/2014, 11:56 AM  [facility] [address] [place], [place], [place] Phone: [phone]   Fax:  [phone]  Name: GLYNNIS GAVEL MRN: [name] Date of Birth: Dec 17, 1950

## 2014-12-17 ENCOUNTER — Encounter: Payer: Self-pay | Admitting: *Deleted

## 2014-12-17 ENCOUNTER — Ambulatory Visit: Payer: Medicare Other | Admitting: *Deleted

## 2014-12-17 DIAGNOSIS — M25572 Pain in left ankle and joints of left foot: Secondary | ICD-10-CM

## 2014-12-17 DIAGNOSIS — M25472 Effusion, left ankle: Secondary | ICD-10-CM

## 2014-12-17 DIAGNOSIS — M25672 Stiffness of left ankle, not elsewhere classified: Secondary | ICD-10-CM

## 2014-12-17 DIAGNOSIS — R6889 Other general symptoms and signs: Secondary | ICD-10-CM

## 2014-12-17 DIAGNOSIS — M25571 Pain in right ankle and joints of right foot: Secondary | ICD-10-CM

## 2014-12-17 NOTE — Therapy (Signed)
[facility] [address] [place], [place], [address] Phone: [phone]   Fax:  [phone]  Physical Therapy Treatment  Patient Details  Name: Kiara Miller MRN: [id number] Date of Birth: Jun 29, 1950 Referring Provider: Toni Arthurs, MD  Encounter Date: 12/17/2014      PT End of Session - 12/17/14 1226    Visit Number 4   Number of Visits 12   Date for PT Re-Evaluation 01/05/15   PT Start Time 1030   PT Stop Time 1119   PT Time Calculation (min) 49 min      Past Medical History  Diagnosis Date  . Hypertension   . Hyperlipidemia   . Joint pain   . Right knee pain   . Polyarthralgia     Past Surgical History  Procedure Laterality Date  . Tonsillectomy    . Tubal ligation    . Mandible surgery    . Osteotomy pelvis bilateral    . Carpal tunnel release      There were no vitals filed for this visit.  Visit Diagnosis:  Pain in left ankle  Pain in right ankle  Stiffness of left ankle joint  Activity intolerance  Swelling of left ankle joint      Subjective Assessment - 12/17/14 1028    Subjective Patient had insidious onset of L ankle pain about 10 months ago. Presented as Plantar facitis although toes were huring too. Now the right ankle is starting. She had PF on left last year and it resolved.   Pertinent History autoimmune, thryoid, RA, OA (Rt knee severe)   Limitations Walking   How long can you stand comfortably? not at all   How long can you walk comfortably? not at all   Diagnostic tests MRI shows Plantar fascia of medial cord, sinus tarsi syndrome,  mild degeneration  middle facet of subtalar jt, flexor digitorum tendinosis   Patient Stated Goals Decrease pain, stand longer   Currently in Pain? Yes   Pain Score 3   8-9/10 after standing a little while   Pain Location Ankle   Pain Orientation Left   Pain Descriptors / Indicators Aching;Burning   Pain Onset More than a month ago   Aggravating Factors   standing/walking                         [facility] Adult PT Treatment/Exercise - 12/17/14 0001    Modalities   Modalities Iontophoresis;Ultrasound;Office manager Location RT ankle LAT, and LT ankle Medial Premod 80-150hz  x15 mins   Ultrasound   Ultrasound Location LT medial malleoli and Plantar fascia and RT lateral malleoli   Ultrasound Parameters 1.5 w/cm2 x 18 mins (10 min LT, RT 8 mins)   Iontophoresis   Type of Iontophoresis Dexamethasone   Location Left medial ankle   Dose 38ml 80 MA/min dose   Time 4 hour patch   Vasopneumatic   Number Minutes Vasopneumatic  15 minutes   Vasopnuematic Location  Ankle   Vasopneumatic Pressure Low   Vasopneumatic Temperature  36   Manual Therapy   Manual Therapy Soft tissue mobilization;Myofascial release   Myofascial Release IASTM and STW to LT plantar fascia and around Medial malleolus                  PT Short Term Goals - 12/08/14 1158    PT SHORT TERM GOAL #1   Title ALL STG=LTG  PT Long Term Goals - 12/08/14 1158    PT LONG TERM GOAL #1   Title I with HEP   Time 4   Period Weeks   Status New   PT LONG TERM GOAL #2   Title able to tolerate standing and waking for 30 minutes with reported  minimal pain.   Time 4   Period Weeks   Status New   PT LONG TERM GOAL #3   Title able to ambulate without AD   Time 4   Period Weeks   Status New   PT LONG TERM GOAL #4   Title Improve L ankle DF to 5 degrees or better to help normalize gait   Time 4   Period Weeks   Status New   PT LONG TERM GOAL #5   Title Able to perform single leg heel raise on L   Time 4   Period Weeks   Status New               Plan - 12/17/14 1029    Clinical Impression Statement Pt did fairly well with Rx and was able to tolerate all parts. She still has notable tenderness with palpation around LT ankle medial malleoli and into plantar  fascia. On RT foot, it is very tender still around lateral malleoli. Pt feels a little relief during Rx, but pain continues once WB for a little while. Goals on-going   Pt will benefit from skilled therapeutic intervention in order to improve on the following deficits Difficulty walking;Pain;Decreased strength;Increased edema;Postural dysfunction   Rehab Potential Good   PT Frequency 2x / week   PT Duration 4 weeks   PT Treatment/Interventions ADLs/Self Care Home Management;Cryotherapy;Electrical Stimulation;Iontophoresis 4mg /ml Dexamethasone;Moist Heat;Therapeutic exercise;Gait training;Ultrasound;Neuromuscular re-education;Patient/family education;Manual techniques;Vasopneumatic Device;Passive range of motion   PT Next Visit Plan Assess ionto and continue to L medial ankle; to L med and R lat ankle, STW/Man   PT Home Exercise Plan ankle pumps, inv/ever, continue with toe scrunches, gastroc stretch        Problem List Patient Active Problem List   Diagnosis Date Noted  . Dyspnea 12/05/2010  . Palpitations 12/05/2010    Jeter Tomey,CHRIS, PTA 12/17/2014, 12:40 PM  [facility] [address] [place], [place], [place] Phone: [phone]   Fax:  [phone]  Name: Kiara Miller MRN: [name] Date of Birth: 06/02/1950

## 2014-12-21 ENCOUNTER — Ambulatory Visit: Payer: Medicare Other | Admitting: Physical Therapy

## 2014-12-21 DIAGNOSIS — M25572 Pain in left ankle and joints of left foot: Secondary | ICD-10-CM

## 2014-12-21 NOTE — Therapy (Signed)
[facility] [address] [place], [place], [address] Phone: [phone]   Fax:  [phone]  Physical Therapy Treatment  Patient Details  Name: Kiara Miller MRN: [id number] Date of Birth: [birth year] Referring Provider: Toni Arthurs, MD  Encounter Date: 12/21/2014      PT End of Session - 12/21/14 1034    Visit Number 5   Number of Visits 12   Date for PT Re-Evaluation 01/05/15   PT Start Time 1035   PT Stop Time 1128   PT Time Calculation (min) 53 min   Activity Tolerance Patient tolerated treatment well   Behavior During Therapy [facility] for tasks assessed/performed      Past Medical History  Diagnosis Date  . Hypertension   . Hyperlipidemia   . Joint pain   . Right knee pain   . Polyarthralgia     Past Surgical History  Procedure Laterality Date  . Tonsillectomy    . Tubal ligation    . Mandible surgery    . Osteotomy pelvis bilateral    . Carpal tunnel release      There were no vitals filed for this visit.  Visit Diagnosis:  Pain in left ankle      Subjective Assessment - 12/21/14 1200    Subjective Continues to have increased pain with standing. Does not understand why she is so tender over R medial malleoli. States that she was up on her feet a lot Saturday for housework and "paid for it." Reports if completing toe curls in supine she cannot move L great toe much but in standing the L great toe flexes more.   Pertinent History autoimmune, thryoid, RA, OA (Rt knee severe)   Limitations Walking   How long can you stand comfortably? not at all   How long can you walk comfortably? not at all   Diagnostic tests MRI shows Plantar fascia of medial cord, sinus tarsi syndrome,  mild degeneration  middle facet of subtalar jt, flexor digitorum tendinosis   Patient Stated Goals Decrease pain, stand longer   Currently in Pain? Yes   Pain Score 5    Pain Orientation Right;Left   Pain Onset More than a month ago   Aggravating Factors  Standing, ambulation   Pain Relieving Factors rest            [facility] PT Assessment - 12/21/14 0001    Assessment   Medical Diagnosis chronic pain of L ankle   Onset Date/Surgical Date 04/07/14   Next MD Visit 4 months                     [facility] Adult PT Treatment/Exercise - 12/21/14 0001    Modalities   Modalities Electrical Stimulation;Moist Heat;Ultrasound;Iontophoresis   Moist Heat Therapy   Number Minutes Moist Heat 15 Minutes   Moist Heat Location Ankle   Electrical Stimulation   Electrical Stimulation Location L medial ankle; R lateral ankle   Electrical Stimulation Action Pre-Mod   Electrical Stimulation Parameters 80-150 Hz x15 min   Electrical Stimulation Goals Pain;Edema   Ultrasound   Ultrasound Location L medial ankle/ Plantar fascia; R lateral ankle   Ultrasound Parameters 1.5 w/cm2, 100%, 1 mhz x8 min each   Ultrasound Goals Pain   Iontophoresis   Type of Iontophoresis Dexamethasone   Location Left medial ankle   Dose 2.0 ml   Time x8 min; 2.5 hr patch   Manual Therapy   Manual Therapy Myofascial release   Myofascial  Release Gentle IASTW to L medial ankle/ Plantar Fascia and R lateral ankle in supine                  PT Short Term Goals - 12/08/14 1158    PT SHORT TERM GOAL #1   Title ALL STG=LTG           PT Long Term Goals - 12/08/14 1158    PT LONG TERM GOAL #1   Title I with HEP   Time 4   Period Weeks   Status New   PT LONG TERM GOAL #2   Title able to tolerate standing and waking for 30 minutes with reported  minimal pain.   Time 4   Period Weeks   Status New   PT LONG TERM GOAL #3   Title able to ambulate without AD   Time 4   Period Weeks   Status New   PT LONG TERM GOAL #4   Title Improve L ankle DF to 5 degrees or better to help normalize gait   Time 4   Period Weeks   Status New   PT LONG TERM GOAL #5   Title Able to perform single leg heel raise on L   Time 4   Period Weeks    Status New               Plan - 12/21/14 1152    Clinical Impression Statement Patient tolerated today's treatment fairly well although she continues to have tenderness in the L medial ankle in the area just medial to the Plantar Fascia. Continues to be very tender to palpation around the R lateral maleoli as well. Presented in clinic with inflammation in the L medial ankle towards the L heel. Goals remain on-going at this time secondary to increased pain, decreased L ankle DF, use of AD to ambulate and increased pain standing. Normal modalities response noted following removal of the modalties. Patient reported feeling "okay" after treatment and upon standing stated that she could feel pain increasing.   Pt will benefit from skilled therapeutic intervention in order to improve on the following deficits Difficulty walking;Pain;Decreased strength;Increased edema;Postural dysfunction   Rehab Potential Good   PT Frequency 2x / week   PT Duration 4 weeks   PT Treatment/Interventions ADLs/Self Care Home Management;Cryotherapy;Electrical Stimulation;Iontophoresis 4mg /ml Dexamethasone;Moist Heat;Therapeutic exercise;Gait training;Ultrasound;Neuromuscular re-education;Patient/family education;Manual techniques;Vasopneumatic Device;Passive range of motion   PT Next Visit Plan Assess ionto and continue to L medial ankle; to L med and R lat ankle, STW/Man   PT Home Exercise Plan ankle pumps, inv/ever, continue with toe scrunches, gastroc stretch   Consulted and Agree with Plan of Care Patient        Problem List Patient Active Problem List   Diagnosis Date Noted  . Dyspnea 12/05/2010  . Palpitations 12/05/2010    12/07/2010, PTA 12/21/2014, 12:06 PM  [facility] [address] [place], [place], [place] Phone: [phone]   Fax:  [phone]  Name: Kiara Miller MRN: [name] Date of Birth: [birth year]

## 2014-12-24 ENCOUNTER — Ambulatory Visit: Payer: Medicare Other | Admitting: Physical Therapy

## 2014-12-24 ENCOUNTER — Encounter: Payer: Self-pay | Admitting: Physical Therapy

## 2014-12-24 DIAGNOSIS — M25472 Effusion, left ankle: Secondary | ICD-10-CM

## 2014-12-24 DIAGNOSIS — M25571 Pain in right ankle and joints of right foot: Secondary | ICD-10-CM

## 2014-12-24 DIAGNOSIS — M25672 Stiffness of left ankle, not elsewhere classified: Secondary | ICD-10-CM

## 2014-12-24 DIAGNOSIS — M25572 Pain in left ankle and joints of left foot: Secondary | ICD-10-CM | POA: Diagnosis not present

## 2014-12-24 DIAGNOSIS — R6889 Other general symptoms and signs: Secondary | ICD-10-CM

## 2014-12-24 NOTE — Therapy (Signed)
[facility] [address] [place], [place], [address] Phone: [phone]   Fax:  [phone]  Physical Therapy Treatment  Patient Details  Name: Kiara Miller MRN: [id number] Date of Birth: [birth year] Referring Provider: Toni Arthurs, MD  Encounter Date: 12/24/2014      PT End of Session - 12/24/14 1033    Visit Number 6   Number of Visits 12   Date for PT Re-Evaluation 01/05/15   PT Start Time 1033   PT Stop Time 1130   PT Time Calculation (min) 57 min   Activity Tolerance Patient tolerated treatment well   Behavior During Therapy [facility] for tasks assessed/performed      Past Medical History  Diagnosis Date  . Hypertension   . Hyperlipidemia   . Joint pain   . Right knee pain   . Polyarthralgia     Past Surgical History  Procedure Laterality Date  . Tonsillectomy    . Tubal ligation    . Mandible surgery    . Osteotomy pelvis bilateral    . Carpal tunnel release      There were no vitals filed for this visit.  Visit Diagnosis:  Pain in left ankle  Pain in right ankle  Stiffness of left ankle joint  Activity intolerance  Swelling of left ankle joint      Subjective Assessment - 12/24/14 1030    Subjective Reports that Wednesday she had increased overall joint pain today. Today pain is mainly B feet and R knee.   Pertinent History autoimmune, thryoid, RA, OA (Rt knee severe)   Limitations Walking   How long can you stand comfortably? not at all   How long can you walk comfortably? not at all   Diagnostic tests MRI shows Plantar fascia of medial cord, sinus tarsi syndrome,  mild degeneration  middle facet of subtalar jt, flexor digitorum tendinosis   Patient Stated Goals Decrease pain, stand longer   Currently in Pain? Yes   Pain Score 5    Pain Location Ankle   Pain Orientation Left;Medial   Pain Descriptors / Indicators Cramping;Aching;Pressure   Pain Onset More than a month ago   Pain Score 2   Pain  Location Ankle   Pain Orientation Right;Lateral   Pain Type Acute pain   Pain Onset More than a month ago            [facility] PT Assessment - 12/24/14 0001    Assessment   Medical Diagnosis chronic pain of L ankle   Onset Date/Surgical Date 04/07/14   Next MD Visit 4 months                     [facility] Adult PT Treatment/Exercise - 12/24/14 0001    Modalities   Modalities Electrical Stimulation;Moist Heat;Ultrasound;Iontophoresis   Moist Heat Therapy   Number Minutes Moist Heat 15 Minutes   Moist Heat Location Ankle   Electrical Stimulation   Electrical Stimulation Location L medial ankle; R lateral ankle   Electrical Stimulation Action Pre-Mod   Electrical Stimulation Parameters 80-150 Hz x15 min   Electrical Stimulation Goals Pain;Edema   Ultrasound   Ultrasound Location L medial ankle and R lateral ankle   Ultrasound Parameters 1.5 w/cm2, 100%, 1 mhz x8 min each   Ultrasound Goals Pain   Iontophoresis   Type of Iontophoresis Dexamethasone   Location Left medial ankle   Dose 2.0 ml   Time x8 min; 2.5 hr patch   Manual Therapy  Manual Therapy Myofascial release   Myofascial Release Gentle IASTW to L medial ankle/ Plantar Fascia and R lateral ankle in supine                  PT Short Term Goals - 12/08/14 1158    PT SHORT TERM GOAL #1   Title ALL STG=LTG           PT Long Term Goals - 12/08/14 1158    PT LONG TERM GOAL #1   Title I with HEP   Time 4   Period Weeks   Status New   PT LONG TERM GOAL #2   Title able to tolerate standing and waking for 30 minutes with reported  minimal pain.   Time 4   Period Weeks   Status New   PT LONG TERM GOAL #3   Title able to ambulate without AD   Time 4   Period Weeks   Status New   PT LONG TERM GOAL #4   Title Improve L ankle DF to 5 degrees or better to help normalize gait   Time 4   Period Weeks   Status New   PT LONG TERM GOAL #5   Title Able to perform single leg heel raise on L    Time 4   Period Weeks   Status New               Plan - 12/24/14 1228    Clinical Impression Statement Patient tolerated today's treatment better today and did not allow tickle sensation to effect her as much today. Continues to have tenderness upon palpation around the R lateral malleoli. Continues to present in clinic with inflammaiton towards to the medial L heel area. Goals remain on-going secondary to increased pain, decreased L ankle DF, use of AD to ambulate and increased pain standing. Normal modalities response noted following removal of the modalites.    Pt will benefit from skilled therapeutic intervention in order to improve on the following deficits Difficulty walking;Pain;Decreased strength;Increased edema;Postural dysfunction   Rehab Potential Good   PT Frequency 2x / week   PT Duration 4 weeks   PT Treatment/Interventions ADLs/Self Care Home Management;Cryotherapy;Electrical Stimulation;Iontophoresis 4mg /ml Dexamethasone;Moist Heat;Therapeutic exercise;Gait training;Ultrasound;Neuromuscular re-education;Patient/family education;Manual techniques;Vasopneumatic Device;Passive range of motion   PT Next Visit Plan Assess ionto and continue to L medial ankle; to L med and R lat ankle, STW/Man   PT Home Exercise Plan ankle pumps, inv/ever, continue with toe scrunches, gastroc stretch   Consulted and Agree with Plan of Care Patient        Problem List Patient Active Problem List   Diagnosis Date Noted  . Dyspnea 12/05/2010  . Palpitations 12/05/2010    12/07/2010, PTA 12/24/2014, 12:33 PM  [facility] [address] [place], [place], [place] Phone: [phone]   Fax:  [phone]  Name: Kiara Miller MRN: Kiara Miller Date of Birth: 06/18/1950

## 2014-12-28 ENCOUNTER — Ambulatory Visit: Payer: Medicare Other | Admitting: Physical Therapy

## 2014-12-28 ENCOUNTER — Encounter: Payer: Self-pay | Admitting: Physical Therapy

## 2014-12-28 DIAGNOSIS — M25672 Stiffness of left ankle, not elsewhere classified: Secondary | ICD-10-CM

## 2014-12-28 DIAGNOSIS — R6889 Other general symptoms and signs: Secondary | ICD-10-CM

## 2014-12-28 DIAGNOSIS — M25572 Pain in left ankle and joints of left foot: Secondary | ICD-10-CM

## 2014-12-28 DIAGNOSIS — M25472 Effusion, left ankle: Secondary | ICD-10-CM

## 2014-12-28 DIAGNOSIS — M25571 Pain in right ankle and joints of right foot: Secondary | ICD-10-CM

## 2014-12-28 NOTE — Therapy (Signed)
[facility] [address] [place], [place], [address] Phone: [phone]   Fax:  [phone]  Physical Therapy Treatment  Patient Details  Name: Kiara Miller MRN: [id number] Date of Birth: [birth year] Referring Provider: Toni Arthurs, MD  Encounter Date: 12/28/2014      PT End of Session - 12/28/14 1207    Visit Number 7   Number of Visits 12   Date for PT Re-Evaluation 01/05/15   PT Start Time 1123   PT Stop Time 1210   PT Time Calculation (min) 47 min   Activity Tolerance Patient tolerated treatment well   Behavior During Therapy [facility] for tasks assessed/performed      Past Medical History  Diagnosis Date  . Hypertension   . Hyperlipidemia   . Joint pain   . Right knee pain   . Polyarthralgia     Past Surgical History  Procedure Laterality Date  . Tonsillectomy    . Tubal ligation    . Mandible surgery    . Osteotomy pelvis bilateral    . Carpal tunnel release      There were no vitals filed for this visit.  Visit Diagnosis:  Pain in left ankle  Pain in right ankle  Stiffness of left ankle joint  Activity intolerance  Swelling of left ankle joint      Subjective Assessment - 12/28/14 1204    Subjective Reports that she went to [employer] and rode in cart yesterday and still had increased pain following that trip. "My feet are killing me today."   Pertinent History autoimmune, thryoid, RA, OA (Rt knee severe)   Limitations Walking   How long can you stand comfortably? not at all   How long can you walk comfortably? not at all   Diagnostic tests MRI shows Plantar fascia of medial cord, sinus tarsi syndrome,  mild degeneration  middle facet of subtalar jt, flexor digitorum tendinosis   Patient Stated Goals Decrease pain, stand longer   Currently in Pain? Yes   Pain Score 8    Pain Location Ankle   Pain Orientation Right;Medial   Pain Descriptors / Indicators Aching;Sharp   Pain Onset More than a month ago   Pain Score 3   Pain Location Ankle   Pain Orientation Right;Lateral   Pain Descriptors / Indicators Aching;Burning   Pain Type Acute pain   Pain Onset More than a month ago            [facility] PT Assessment - 12/28/14 0001    Assessment   Medical Diagnosis chronic pain of L ankle   Onset Date/Surgical Date 04/07/14   Next MD Visit 01/04/2015  Rhuematologist                     [facility] Adult PT Treatment/Exercise - 12/28/14 0001    Modalities   Modalities Electrical Stimulation;Moist Heat;Ultrasound;Iontophoresis   Moist Heat Therapy   Number Minutes Moist Heat 15 Minutes   Moist Heat Location Ankle   Electrical Stimulation   Electrical Stimulation Location L medial ankle; R lateral ankle   Electrical Stimulation Action Pre-Mod   Electrical Stimulation Parameters 80-150 Hz x15 min   Electrical Stimulation Goals Pain;Edema   Ultrasound   Ultrasound Location L medial malleoli, R lateral malleoli   Ultrasound Parameters 1.5 w/cm2, 100%, 1 mhz x8 min each   Ultrasound Goals Pain   Iontophoresis   Type of Iontophoresis --   Location --   Dose --   Time --  Manual Therapy   Manual Therapy Myofascial release   Myofascial Release Gentle IASTW to L medial ankle/ Plantar Fascia and R lateral ankle in supine                  PT Short Term Goals - 12/08/14 1158    PT SHORT TERM GOAL #1   Title ALL STG=LTG           PT Long Term Goals - 12/08/14 1158    PT LONG TERM GOAL #1   Title I with HEP   Time 4   Period Weeks   Status New   PT LONG TERM GOAL #2   Title able to tolerate standing and waking for 30 minutes with reported  minimal pain.   Time 4   Period Weeks   Status New   PT LONG TERM GOAL #3   Title able to ambulate without AD   Time 4   Period Weeks   Status New   PT LONG TERM GOAL #4   Title Improve L ankle DF to 5 degrees or better to help normalize gait   Time 4   Period Weeks   Status New   PT LONG TERM GOAL #5   Title Able  to perform single leg heel raise on L   Time 4   Period Weeks   Status New               Plan - 12/28/14 1207    Clinical Impression Statement Patient tolerated today's treatment fairly well today although she had increased pain in B ankles today. Continues to present with L medial ankle swelling towards L medial heel. Patient did not flinch or jump much today with palpation and IASTW. Goals remain on-going at this time due to increased pain, decreased L ankle DF, use of AD, increased pain wiith standing. Iontophoresis sessions have been completed 6/6 at this time. Expressed facial grimacing upon stanidng following treatment but did not give numerical pain rating.   Pt will benefit from skilled therapeutic intervention in order to improve on the following deficits Difficulty walking;Pain;Decreased strength;Increased edema;Postural dysfunction   Rehab Potential Good   PT Frequency 2x / week   PT Duration 4 weeks   PT Treatment/Interventions ADLs/Self Care Home Management;Cryotherapy;Electrical Stimulation;Iontophoresis 4mg /ml Dexamethasone;Moist Heat;Therapeutic exercise;Gait training;Ultrasound;Neuromuscular re-education;Patient/family education;Manual techniques;Vasopneumatic Device;Passive range of motion   PT Next Visit Plan Assess ionto and continue to L medial ankle; to L med and R lat ankle, STW/Man   PT Home Exercise Plan ankle pumps, inv/ever, continue with toe scrunches, gastroc stretch   Consulted and Agree with Plan of Care Patient        Problem List Patient Active Problem List   Diagnosis Date Noted  . Dyspnea 12/05/2010  . Palpitations 12/05/2010    12/07/2010, PTA 12/28/2014 12:28 PM  [facility] [address] [place], [place], [place] Phone: [phone]   Fax:  [phone]  Name: Kiara Miller MRN: [name] Date of Birth: 26-Oct-1950

## 2015-01-03 ENCOUNTER — Encounter: Payer: Self-pay | Admitting: Physical Therapy

## 2015-01-03 ENCOUNTER — Ambulatory Visit: Payer: Medicare Other | Admitting: Physical Therapy

## 2015-01-03 DIAGNOSIS — M25572 Pain in left ankle and joints of left foot: Secondary | ICD-10-CM

## 2015-01-03 DIAGNOSIS — M25672 Stiffness of left ankle, not elsewhere classified: Secondary | ICD-10-CM

## 2015-01-03 DIAGNOSIS — R6889 Other general symptoms and signs: Secondary | ICD-10-CM

## 2015-01-03 DIAGNOSIS — M25571 Pain in right ankle and joints of right foot: Secondary | ICD-10-CM

## 2015-01-03 DIAGNOSIS — M25472 Effusion, left ankle: Secondary | ICD-10-CM

## 2015-01-03 NOTE — Therapy (Addendum)
[facility] [address] [place], [place], [address] Phone: [phone]   Fax:  [phone]  Physical Therapy Treatment  Patient Details  Name: Kiara Miller MRN: [id number] Date of Birth: 07/06/1950 No Data Recorded  Encounter Date: 01/03/2015    Past Medical History:  Diagnosis Date  . Diverticulitis April 2017  . Hyperlipidemia   . Hypertension   . Joint pain   . Polyarthralgia   . Right knee pain     Past Surgical History:  Procedure Laterality Date  . CARPAL TUNNEL RELEASE    . MANDIBLE SURGERY    . OSTEOTOMY PELVIS BILATERAL    . TONSILLECTOMY    . TUBAL LIGATION      There were no vitals filed for this visit.  Visit Diagnosis:  Pain in left ankle  Pain in right ankle  Stiffness of left ankle joint  Activity intolerance  Swelling of left ankle joint                                 PT Short Term Goals - 12/08/14 1158      PT SHORT TERM GOAL #1   Title ALL STG=LTG           PT Long Term Goals - 12/08/14 1158      PT LONG TERM GOAL #1   Title I with HEP   Time 4   Period Weeks   Status New     PT LONG TERM GOAL #2   Title able to tolerate standing and waking for 30 minutes with reported  minimal pain.   Time 4   Period Weeks   Status New     PT LONG TERM GOAL #3   Title able to ambulate without AD   Time 4   Period Weeks   Status New     PT LONG TERM GOAL #4   Title Improve L ankle DF to 5 degrees or better to help normalize gait   Time 4   Period Weeks   Status New     PT LONG TERM GOAL #5   Title Able to perform single leg heel raise on L   Time 4   Period Weeks   Status New               Problem List Patient Active Problem List   Diagnosis Date Noted  . 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 Uveitis 10/10/2015  . Diverticulitis 10/10/2015  . Dyspnea 12/05/2010  . Palpitations 12/05/2010     Kelsey Parsons, PTA 01/02/16 6:37 PM Chad Applegate MPT [facility] [address] [place], [place], [address] Phone: [phone]   Fax:  [phone]  Name: Kiara Miller MRN: [id number] Date of Birth: 04/17/1950   PHYSICAL THERAPY DISCHARGE SUMMARY  Visits from Start of Care: 8.  Current functional level related to goals / functional outcomes: Please see above.   Remaining deficits: Continued left foot and ankle pain.   Education / Equipment: HEP. Plan: Patient agrees to discharge.  Patient goals were not met. Patient is being discharged due to lack of progress.  ?????         Chad Applegate MPT  

## 2015-01-10 ENCOUNTER — Other Ambulatory Visit (HOSPITAL_COMMUNITY): Payer: Self-pay | Admitting: Rheumatology

## 2015-01-10 ENCOUNTER — Encounter (HOSPITAL_COMMUNITY): Payer: Self-pay

## 2015-01-10 ENCOUNTER — Ambulatory Visit (HOSPITAL_COMMUNITY)
Admission: RE | Admit: 2015-01-10 | Discharge: 2015-01-10 | Disposition: A | Discharge status: Home / Self Care | Payer: Medicare Other | Source: Ambulatory Visit | Attending: Rheumatology | Admitting: Rheumatology

## 2015-01-10 DIAGNOSIS — H209 Unspecified iridocyclitis: Secondary | ICD-10-CM | POA: Insufficient documentation

## 2015-01-10 DIAGNOSIS — M069 Rheumatoid arthritis, unspecified: Secondary | ICD-10-CM | POA: Diagnosis present

## 2015-01-10 MED ORDER — SODIUM CHLORIDE 0.9 % IV SOLN
Freq: Once | INTRAVENOUS | Status: AC
  Administered 2015-01-10: 250 mL via INTRAVENOUS

## 2015-01-10 MED ORDER — SODIUM CHLORIDE 0.9 % IV SOLN
750.0000 mg | Freq: Once | INTRAVENOUS | Status: AC
  Administered 2015-01-10: 750 mg via INTRAVENOUS
  Filled 2015-01-10: qty 30

## 2015-01-10 MED ORDER — DIPHENHYDRAMINE HCL 25 MG PO TABS
25.0000 mg | ORAL_TABLET | ORAL | Status: DC | PRN
  Filled 2015-01-10: qty 1

## 2015-01-10 MED ORDER — ACETAMINOPHEN 325 MG PO TABS
650.0000 mg | ORAL_TABLET | ORAL | Status: DC | PRN
  Administered 2015-01-10: 650 mg via ORAL
  Filled 2015-01-10: qty 2

## 2015-01-10 MED ORDER — DIPHENHYDRAMINE HCL 25 MG PO CAPS
25.0000 mg | ORAL_CAPSULE | ORAL | Status: DC | PRN
  Administered 2015-01-10: 25 mg via ORAL
  Filled 2015-01-10: qty 1

## 2015-01-10 NOTE — Progress Notes (Signed)
Uneventful infusion of first dose  Orencia(week 0) to return on 01/24/15 for week 2.Pt did have some dizziness related to the premeds of Tylenol and Benadry but was able to ambulate to BR with minimal assist. Pt was discharged via w/c accompanied by staff to meet sister at car

## 2015-01-10 NOTE — Discharge Instructions (Signed)
ORENCIA °Abatacept solution for injection (subcutaneous or intravenous use) °What is this medicine? °ABATACEPT (a ba TA sept) is used to treat moderate to severe active rheumatoid arthritis in adults. This medicine is also used to treat juvenile idiopathic arthritis. °This medicine may be used for other purposes; ask your health care provider or pharmacist if you have questions. °What should I tell my health care provider before I take this medicine? °They need to know if you have any of these conditions: °-are taking other medicines to treat rheumatoid arthritis °-COPD °-diabetes °-infection or history of infections °-recently received or scheduled to receive a vaccine °-scheduled to have surgery °-tuberculosis, a positive skin test for tuberculosis or have recently been in close contact with someone who has tuberculosis °-viral hepatitis °-an unusual or allergic reaction to abatacept, other medicines, foods, dyes, or preservatives °-pregnant or trying to get pregnant °-breast-feeding °How should I use this medicine? °This medicine is for infusion into a vein or for injection under the skin. Infusions are given by a health care professional in a hospital or clinic setting. If you are to give your own medicine at home, you will be taught how to prepare and give this medicine under the skin. Use exactly as directed. Take your medicine at regular intervals. Do not take your medicine more often than directed. °It is important that you put your used needles and syringes in a special sharps container. Do not put them in a trash can. If you do not have a sharps container, call your pharmacist or healthcare provider to get one. °Talk to your pediatrician regarding the use of this medicine in children. While infusions in a clinic may be prescribed for children as young as 6 years for selected conditions, precautions do apply. °Overdosage: If you think you have taken too much of this medicine contact a poison control center  or emergency room at once. °NOTE: This medicine is only for you. Do not share this medicine with others. °What if I miss a dose? °This medicine is used once a week if given by injection under the skin. If you miss a dose, take it as soon as you can. If it is almost time for your next dose, take only that dose. Do not take double or extra doses. °If you are to be given an infusion, it is important not to miss your dose. Doses are usually every 4 weeks. Call your doctor or health care professional if you are unable to keep an appointment. °What may interact with this medicine? °Do not take this medicine with any of the following medications: °-adalimumab °-anakinra °-certolizumab °-etanercept °-golimumab °-infliximab °-live virus vaccines °-rituximab °-tocilizumab °This medicine may also interact with the following medications: °-vaccines °This list may not describe all possible interactions. Give your health care provider a list of all the medicines, herbs, non-prescription drugs, or dietary supplements you use. Also tell them if you smoke, drink alcohol, or use illegal drugs. Some items may interact with your medicine. °What should I watch for while using this medicine? °Visit your doctor for regular check ups while you are taking this medicine. Tell your doctor or healthcare professional if your symptoms do not start to get better or if they get worse. °Call your doctor or health care professional if you get a cold or other infection while receiving this medicine. Do not treat yourself. This medicine may decrease your body's ability to fight infection. Try to avoid being around people who are sick. °What side effects may   I notice from receiving this medicine? °Side effects that you should report to your doctor or health care professional as soon as possible: °-allergic reactions like skin rash, itching or hives, swelling of the face, lips, or tongue °-breathing problems °-chest pain °-signs of infection - fever or  chills, cough, unusual tiredness, pain or trouble passing urine, or warm, red or painful skin °Side effects that usually do not require medical attention (Report these to your doctor or health care professional if they continue or are bothersome.): °-dizziness °-headache °-nausea, vomiting °-sore throat °-stomach upset °This list may not describe all possible side effects. Call your doctor for medical advice about side effects. You may report side effects to FDA at 1-800-FDA-1088. °Where should I keep my medicine? °Infusions will be given in a hospital or clinic and will not be stored at home. °Storage for syringes given under the skin and stored at home: °Keep out of the reach of children. Store in a refrigerator between 2 and 8 degrees C (36 and 46 degrees F). Keep this medicine in the original container. Protect from light. Do not freeze. Throw away any unused medicine after the expiration date. °NOTE: This sheet is a summary. It may not cover all possible information. If you have questions about this medicine, talk to your doctor, pharmacist, or health care provider. °  °© 2016, Elsevier/[employer] Standard. (2009-12-09 11:11:03) °Arthritis °Arthritis is a term that is commonly used to refer to joint pain or joint disease. There are more than 100 types of arthritis. °CAUSES °The most common cause of this condition is wear and tear of a joint. Other causes include: °· Gout. °· Inflammation of a joint. °· An infection of a joint. °· Sprains and other injuries near the joint. °· A drug reaction or allergic reaction. °In some cases, the cause may not be known. °SYMPTOMS °The main symptom of this condition is pain in the joint with movement. Other symptoms include: °· Redness, swelling, or stiffness at a joint. °· Warmth coming from the joint. °· Fever. °· Overall feeling of illness. °DIAGNOSIS °This condition may be diagnosed with a physical exam and tests, including: °· Blood tests. °· Urine tests. °· Imaging tests, such  as MRI, X-rays, or a CT scan. °Sometimes, fluid is removed from a joint for testing. °TREATMENT °Treatment for this condition may involve: °· Treatment of the cause, if it is known. °· Rest. °· Raising (elevating) the joint. °· Applying cold or hot packs to the joint. °· Medicines to improve symptoms and reduce inflammation. °· Injections of a steroid such as cortisone into the joint to help reduce pain and inflammation. °Depending on the cause of your arthritis, you may need to make lifestyle changes to reduce stress on your joint. These changes may include exercising more and losing weight. °HOME CARE INSTRUCTIONS °Medicines °· Take over-the-counter and prescription medicines only as told by your health care provider. °· Do not take aspirin to relieve pain if gout is suspected. °Activities °· Rest your joint if told by your health care provider. Rest is important when your disease is active and your joint feels painful, swollen, or stiff. °· Avoid activities that make the pain worse. It is important to balance activity with rest. °· Exercise your joint regularly with range-of-motion exercises as told by your health care provider. Try doing low-impact exercise, such as: °¨ Swimming. °¨ Water aerobics. °¨ Biking. °¨ Walking. °Joint Care °· If your joint is swollen, keep it elevated if told by your   health care provider. °· If your joint feels stiff in the morning, try taking a warm shower. °· If directed, apply heat to the joint. If you have diabetes, do not apply heat without permission from your health care provider. °¨ Put a towel between the joint and the hot pack or heating pad. °¨ Leave the heat on the area for 20-30 minutes. °· If directed, apply ice to the joint: °¨ Put ice in a plastic bag. °¨ Place a towel between your skin and the bag. °¨ Leave the ice on for 20 minutes, 2-3 times per day. °· Keep all follow-up visits as told by your health care provider. This is important. °SEEK MEDICAL CARE IF: °· The  pain gets worse. °· You have a fever. °SEEK IMMEDIATE MEDICAL CARE IF: °· You develop severe joint pain, swelling, or redness. °· Many joints become painful and swollen. °· You develop severe back pain. °· You develop severe weakness in your leg. °· You cannot control your bladder or bowels. °  °This information is not intended to replace advice given to you by your health care provider. Make sure you discuss any questions you have with your health care provider. °  °Document Released: 03/01/2004 Document Revised: 10/13/2014 Document Reviewed: 04/19/2014 °Elsevier Interactive Patient Education ©2016 Elsevier Inc. ° ° °

## 2015-01-11 ENCOUNTER — Other Ambulatory Visit: Payer: Self-pay | Admitting: Family Medicine

## 2015-01-11 ENCOUNTER — Other Ambulatory Visit: Payer: Self-pay

## 2015-01-11 DIAGNOSIS — E2839 Other primary ovarian failure: Secondary | ICD-10-CM

## 2015-01-11 DIAGNOSIS — Z1231 Encounter for screening mammogram for malignant neoplasm of breast: Secondary | ICD-10-CM

## 2015-01-24 ENCOUNTER — Encounter (HOSPITAL_COMMUNITY): Payer: Self-pay

## 2015-01-24 ENCOUNTER — Encounter (HOSPITAL_COMMUNITY)
Admission: RE | Admit: 2015-01-24 | Discharge: 2015-01-24 | Disposition: A | Discharge status: Home / Self Care | Payer: Medicare Other | Source: Ambulatory Visit | Attending: Rheumatology | Admitting: Rheumatology

## 2015-01-24 DIAGNOSIS — H209 Unspecified iridocyclitis: Secondary | ICD-10-CM | POA: Diagnosis not present

## 2015-01-24 DIAGNOSIS — M069 Rheumatoid arthritis, unspecified: Secondary | ICD-10-CM | POA: Insufficient documentation

## 2015-01-24 MED ORDER — SODIUM CHLORIDE 0.9 % IV SOLN
Freq: Once | INTRAVENOUS | Status: AC
  Administered 2015-01-24: 11:00:00 via INTRAVENOUS

## 2015-01-24 MED ORDER — SODIUM CHLORIDE 0.9 % IV SOLN
750.0000 mg | Freq: Once | INTRAVENOUS | Status: AC
  Administered 2015-01-24: 750 mg via INTRAVENOUS
  Filled 2015-01-24: qty 30

## 2015-01-24 MED ORDER — ACETAMINOPHEN 325 MG PO TABS
650.0000 mg | ORAL_TABLET | ORAL | Status: DC | PRN
  Administered 2015-01-24: 650 mg via ORAL
  Filled 2015-01-24: qty 2

## 2015-01-24 MED ORDER — DIPHENHYDRAMINE HCL 25 MG PO CAPS
25.0000 mg | ORAL_CAPSULE | ORAL | Status: DC | PRN
  Administered 2015-01-24: 25 mg via ORAL
  Filled 2015-01-24: qty 1

## 2015-02-08 ENCOUNTER — Encounter (HOSPITAL_COMMUNITY)
Admission: RE | Admit: 2015-02-08 | Discharge: 2015-02-08 | Disposition: A | Discharge status: Home / Self Care | Payer: Medicare Other | Source: Ambulatory Visit | Attending: Rheumatology | Admitting: Rheumatology

## 2015-02-08 ENCOUNTER — Encounter (HOSPITAL_COMMUNITY): Payer: Self-pay

## 2015-02-08 DIAGNOSIS — M069 Rheumatoid arthritis, unspecified: Secondary | ICD-10-CM | POA: Insufficient documentation

## 2015-02-08 DIAGNOSIS — H209 Unspecified iridocyclitis: Secondary | ICD-10-CM | POA: Insufficient documentation

## 2015-02-08 MED ORDER — ACETAMINOPHEN 325 MG PO TABS
650.0000 mg | ORAL_TABLET | ORAL | Status: DC | PRN
  Administered 2015-02-08: 650 mg via ORAL
  Filled 2015-02-08: qty 2

## 2015-02-08 MED ORDER — SODIUM CHLORIDE 0.9 % IV SOLN
INTRAVENOUS | Status: DC
  Administered 2015-02-08: 14:00:00 via INTRAVENOUS

## 2015-02-08 MED ORDER — DIPHENHYDRAMINE HCL 25 MG PO CAPS
25.0000 mg | ORAL_CAPSULE | ORAL | Status: DC | PRN
  Administered 2015-02-08: 25 mg via ORAL
  Filled 2015-02-08: qty 1

## 2015-02-08 MED ORDER — ABATACEPT 250 MG IV SOLR
750.0000 mg | Freq: Once | INTRAVENOUS | Status: AC
  Administered 2015-02-08: 750 mg via INTRAVENOUS
  Filled 2015-02-08: qty 30

## 2015-02-08 NOTE — Progress Notes (Signed)
Uneventful infusion of #rd dose Oriencia. Pt was discharged via w/c accompanied by staff ( pt arrived ambulatory with cane accompanied by husband) but the Tylenol and Benadryl made her sleepy , to meet husband at car. Pt is now on the every 4 week schedule beginning 03/08/15

## 2015-03-01 ENCOUNTER — Ambulatory Visit
Admission: RE | Admit: 2015-03-01 | Discharge: 2015-03-01 | Disposition: A | Discharge status: Home / Self Care | Payer: Medicare Other | Source: Ambulatory Visit

## 2015-03-01 ENCOUNTER — Ambulatory Visit
Admission: RE | Admit: 2015-03-01 | Discharge: 2015-03-01 | Disposition: A | Discharge status: Home / Self Care | Payer: Medicare Other | Source: Ambulatory Visit | Attending: Family Medicine | Admitting: Family Medicine

## 2015-03-01 DIAGNOSIS — E2839 Other primary ovarian failure: Secondary | ICD-10-CM

## 2015-03-01 DIAGNOSIS — Z1231 Encounter for screening mammogram for malignant neoplasm of breast: Secondary | ICD-10-CM

## 2015-03-08 ENCOUNTER — Encounter (HOSPITAL_COMMUNITY)
Admission: RE | Admit: 2015-03-08 | Discharge: 2015-03-08 | Disposition: A | Discharge status: Home / Self Care | Payer: Medicare Other | Source: Ambulatory Visit | Attending: Rheumatology | Admitting: Rheumatology

## 2015-03-08 DIAGNOSIS — H209 Unspecified iridocyclitis: Secondary | ICD-10-CM | POA: Diagnosis not present

## 2015-03-08 LAB — COMPREHENSIVE METABOLIC PANEL
ALT: 22 U/L (ref 14–54)
AST: 26 U/L (ref 15–41)
Albumin: 4 g/dL (ref 3.5–5.0)
Alkaline Phosphatase: 57 U/L (ref 38–126)
Anion gap: 11 (ref 5–15)
BUN: 19 mg/dL (ref 6–20)
CALCIUM: 9.3 mg/dL (ref 8.9–10.3)
CO2: 22 mmol/L (ref 22–32)
Chloride: 104 mmol/L (ref 101–111)
Creatinine, Ser: 0.84 mg/dL (ref 0.44–1.00)
GLUCOSE: 115 mg/dL — AB (ref 65–99)
POTASSIUM: 3.5 mmol/L (ref 3.5–5.1)
Sodium: 137 mmol/L (ref 135–145)
TOTAL PROTEIN: 7 g/dL (ref 6.5–8.1)
Total Bilirubin: 0.3 mg/dL (ref 0.3–1.2)

## 2015-03-08 LAB — CBC
HCT: 43.5 % (ref 36.0–46.0)
Hemoglobin: 14.7 g/dL (ref 12.0–15.0)
MCH: 30.6 pg (ref 26.0–34.0)
MCHC: 33.8 g/dL (ref 30.0–36.0)
MCV: 90.4 fL (ref 78.0–100.0)
PLATELETS: 218 10*3/uL (ref 150–400)
RBC: 4.81 MIL/uL (ref 3.87–5.11)
RDW: 13.7 % (ref 11.5–15.5)
WBC: 7.3 10*3/uL (ref 4.0–10.5)

## 2015-03-08 MED ORDER — SODIUM CHLORIDE 0.9 % IV SOLN
INTRAVENOUS | Status: AC
  Administered 2015-03-08: 14:00:00 via INTRAVENOUS

## 2015-03-08 MED ORDER — ACETAMINOPHEN 325 MG PO TABS
650.0000 mg | ORAL_TABLET | ORAL | Status: AC
  Administered 2015-03-08: 650 mg via ORAL
  Filled 2015-03-08: qty 2

## 2015-03-08 MED ORDER — ABATACEPT 250 MG IV SOLR
750.0000 mg | Freq: Once | INTRAVENOUS | Status: AC
  Administered 2015-03-08: 750 mg via INTRAVENOUS
  Filled 2015-03-08: qty 30

## 2015-03-08 MED ORDER — DIPHENHYDRAMINE HCL 25 MG PO CAPS
25.0000 mg | ORAL_CAPSULE | ORAL | Status: AC
  Administered 2015-03-08: 25 mg via ORAL
  Filled 2015-03-08: qty 1

## 2015-04-05 ENCOUNTER — Encounter (HOSPITAL_COMMUNITY)
Admission: RE | Admit: 2015-04-05 | Discharge: 2015-04-05 | Disposition: A | Discharge status: Home / Self Care | Payer: Medicare Other | Source: Ambulatory Visit | Attending: Rheumatology | Admitting: Rheumatology

## 2015-04-05 DIAGNOSIS — H209 Unspecified iridocyclitis: Secondary | ICD-10-CM | POA: Diagnosis not present

## 2015-04-05 DIAGNOSIS — M069 Rheumatoid arthritis, unspecified: Secondary | ICD-10-CM | POA: Diagnosis present

## 2015-04-05 MED ORDER — ACETAMINOPHEN 325 MG PO TABS
650.0000 mg | ORAL_TABLET | ORAL | Status: AC
  Administered 2015-04-05: 650 mg via ORAL
  Filled 2015-04-05: qty 2

## 2015-04-05 MED ORDER — SODIUM CHLORIDE 0.9 % IV SOLN
INTRAVENOUS | Status: AC
  Administered 2015-04-05: 15:00:00 via INTRAVENOUS

## 2015-04-05 MED ORDER — SODIUM CHLORIDE 0.9 % IV SOLN
750.0000 mg | Freq: Once | INTRAVENOUS | Status: AC
  Administered 2015-04-05: 750 mg via INTRAVENOUS
  Filled 2015-04-05: qty 30

## 2015-04-05 MED ORDER — DIPHENHYDRAMINE HCL 25 MG PO CAPS
25.0000 mg | ORAL_CAPSULE | ORAL | Status: AC
  Administered 2015-04-05: 25 mg via ORAL
  Filled 2015-04-05: qty 1

## 2015-04-06 ENCOUNTER — Encounter (HOSPITAL_COMMUNITY): Payer: BLUE CROSS/BLUE SHIELD

## 2015-05-03 ENCOUNTER — Encounter (HOSPITAL_COMMUNITY): Payer: Self-pay

## 2015-05-03 ENCOUNTER — Encounter (HOSPITAL_COMMUNITY)
Admission: RE | Admit: 2015-05-03 | Discharge: 2015-05-03 | Disposition: A | Discharge status: Home / Self Care | Payer: Medicare Other | Source: Ambulatory Visit | Attending: Rheumatology | Admitting: Rheumatology

## 2015-05-03 DIAGNOSIS — M069 Rheumatoid arthritis, unspecified: Secondary | ICD-10-CM | POA: Insufficient documentation

## 2015-05-03 DIAGNOSIS — H209 Unspecified iridocyclitis: Secondary | ICD-10-CM | POA: Diagnosis not present

## 2015-05-03 LAB — COMPREHENSIVE METABOLIC PANEL
ALBUMIN: 4.2 g/dL (ref 3.5–5.0)
ALT: 26 U/L (ref 14–54)
AST: 22 U/L (ref 15–41)
Alkaline Phosphatase: 46 U/L (ref 38–126)
Anion gap: 12 (ref 5–15)
BUN: 27 mg/dL — AB (ref 6–20)
CHLORIDE: 105 mmol/L (ref 101–111)
CO2: 23 mmol/L (ref 22–32)
CREATININE: 0.73 mg/dL (ref 0.44–1.00)
Calcium: 9.9 mg/dL (ref 8.9–10.3)
GFR calc Af Amer: >60 mL/min (ref >60)
GLUCOSE: 99 mg/dL (ref 65–99)
POTASSIUM: 3.6 mmol/L (ref 3.5–5.1)
SODIUM: 140 mmol/L (ref 135–145)
Total Bilirubin: 0.7 mg/dL (ref 0.3–1.2)
Total Protein: 7 g/dL (ref 6.5–8.1)

## 2015-05-03 LAB — CBC WITH DIFFERENTIAL/PLATELET
BASOS ABS: 0 10*3/uL (ref 0.0–0.1)
BASOS PCT: 1 %
Eosinophils Absolute: 0.1 10*3/uL (ref 0.0–0.7)
Eosinophils Relative: 3 %
HEMATOCRIT: 44.3 % (ref 36.0–46.0)
HEMOGLOBIN: 15.3 g/dL — AB (ref 12.0–15.0)
LYMPHS PCT: 34 %
Lymphs Abs: 1.9 10*3/uL (ref 0.7–4.0)
MCH: 30.4 pg (ref 26.0–34.0)
MCHC: 34.5 g/dL (ref 30.0–36.0)
MCV: 87.9 fL (ref 78.0–100.0)
MONOS PCT: 8 %
Monocytes Absolute: 0.4 10*3/uL (ref 0.1–1.0)
NEUTROS ABS: 3.1 10*3/uL (ref 1.7–7.7)
NEUTROS PCT: 56 %
Platelets: 184 10*3/uL (ref 150–400)
RBC: 5.04 MIL/uL (ref 3.87–5.11)
RDW: 13.7 % (ref 11.5–15.5)
WBC: 5.6 10*3/uL (ref 4.0–10.5)

## 2015-05-03 LAB — URIC ACID: URIC ACID, SERUM: 7.8 mg/dL — AB (ref 2.3–6.6)

## 2015-05-03 MED ORDER — SODIUM CHLORIDE 0.9 % IV SOLN
750.0000 mg | INTRAVENOUS | Status: DC
  Administered 2015-05-03: 750 mg via INTRAVENOUS
  Filled 2015-05-03: qty 30

## 2015-05-03 MED ORDER — ACETAMINOPHEN 325 MG PO TABS
650.0000 mg | ORAL_TABLET | ORAL | Status: DC
  Administered 2015-05-03: 650 mg via ORAL
  Filled 2015-05-03: qty 2

## 2015-05-03 MED ORDER — SODIUM CHLORIDE 0.9 % IV SOLN
INTRAVENOUS | Status: DC
  Administered 2015-05-03: 12:00:00 via INTRAVENOUS

## 2015-05-03 MED ORDER — DIPHENHYDRAMINE HCL 25 MG PO CAPS
25.0000 mg | ORAL_CAPSULE | ORAL | Status: DC
  Administered 2015-05-03: 25 mg via ORAL
  Filled 2015-05-03 (×2): qty 1

## 2015-05-03 NOTE — Discharge Instructions (Signed)
Orencia Abatacept solution for injection (subcutaneous or intravenous use) What is this medicine? ABATACEPT (a ba TA sept) is used to treat moderate to severe active rheumatoid arthritis in adults. This medicine is also used to treat juvenile idiopathic arthritis. This medicine may be used for other purposes; ask your health care provider or pharmacist if you have questions. What should I tell my health care provider before I take this medicine? They need to know if you have any of these conditions: -are taking other medicines to treat rheumatoid arthritis -COPD -diabetes -infection or history of infections -recently received or scheduled to receive a vaccine -scheduled to have surgery -tuberculosis, a positive skin test for tuberculosis or have recently been in close contact with someone who has tuberculosis -viral hepatitis -an unusual or allergic reaction to abatacept, other medicines, foods, dyes, or preservatives -pregnant or trying to get pregnant -breast-feeding How should I use this medicine? This medicine is for infusion into a vein or for injection under the skin. Infusions are given by a health care professional in a hospital or clinic setting. If you are to give your own medicine at home, you will be taught how to prepare and give this medicine under the skin. Use exactly as directed. Take your medicine at regular intervals. Do not take your medicine more often than directed. It is important that you put your used needles and syringes in a special sharps container. Do not put them in a trash can. If you do not have a sharps container, call your pharmacist or healthcare provider to get one. Talk to your pediatrician regarding the use of this medicine in children. While infusions in a clinic may be prescribed for children as young as 6 years for selected conditions, precautions do apply. Overdosage: If you think you have taken too much of this medicine contact a poison control center  or emergency room at once. NOTE: This medicine is only for you. Do not share this medicine with others. What if I miss a dose? This medicine is used once a week if given by injection under the skin. If you miss a dose, take it as soon as you can. If it is almost time for your next dose, take only that dose. Do not take double or extra doses. If you are to be given an infusion, it is important not to miss your dose. Doses are usually every 4 weeks. Call your doctor or health care professional if you are unable to keep an appointment. What may interact with this medicine? Do not take this medicine with any of the following medications: -adalimumab -anakinra -certolizumab -etanercept -golimumab -infliximab -live virus vaccines -rituximab -tocilizumab This medicine may also interact with the following medications: -vaccines This list may not describe all possible interactions. Give your health care provider a list of all the medicines, herbs, non-prescription drugs, or dietary supplements you use. Also tell them if you smoke, drink alcohol, or use illegal drugs. Some items may interact with your medicine. What should I watch for while using this medicine? Visit your doctor for regular check ups while you are taking this medicine. Tell your doctor or healthcare professional if your symptoms do not start to get better or if they get worse. Call your doctor or health care professional if you get a cold or other infection while receiving this medicine. Do not treat yourself. This medicine may decrease your body's ability to fight infection. Try to avoid being around people who are sick. What side effects may  I notice from receiving this medicine? Side effects that you should report to your doctor or health care professional as soon as possible: -allergic reactions like skin rash, itching or hives, swelling of the face, lips, or tongue -breathing problems -chest pain -signs of infection - fever or  chills, cough, unusual tiredness, pain or trouble passing urine, or warm, red or painful skin Side effects that usually do not require medical attention (Report these to your doctor or health care professional if they continue or are bothersome.): -dizziness -headache -nausea, vomiting -sore throat -stomach upset This list may not describe all possible side effects. Call your doctor for medical advice about side effects. You may report side effects to FDA at 1-800-FDA-1088. Where should I keep my medicine? Infusions will be given in a hospital or clinic and will not be stored at home. Storage for syringes given under the skin and stored at home: Keep out of the reach of children. Store in a refrigerator between 2 and 8 degrees C (36 and 46 degrees F). Keep this medicine in the original container. Protect from light. Do not freeze. Throw away any unused medicine after the expiration date. NOTE: This sheet is a summary. It may not cover all possible information. If you have questions about this medicine, talk to your doctor, pharmacist, or health care provider.    2016, Elsevier/Gold Standard. (2009-12-09 11:11:03)

## 2015-05-04 ENCOUNTER — Encounter (HOSPITAL_COMMUNITY): Payer: BLUE CROSS/BLUE SHIELD

## 2015-05-07 DIAGNOSIS — K5792 Diverticulitis of intestine, part unspecified, without perforation or abscess without bleeding: Secondary | ICD-10-CM

## 2015-05-07 HISTORY — DX: Diverticulitis of intestine, part unspecified, without perforation or abscess without bleeding: K57.92

## 2015-05-12 ENCOUNTER — Other Ambulatory Visit (HOSPITAL_COMMUNITY): Payer: Self-pay | Admitting: Family Medicine

## 2015-05-12 ENCOUNTER — Ambulatory Visit (HOSPITAL_COMMUNITY)
Admission: RE | Admit: 2015-05-12 | Discharge: 2015-05-12 | Disposition: A | Discharge status: Home / Self Care | Payer: Medicare Other | Source: Ambulatory Visit | Attending: Family Medicine | Admitting: Family Medicine

## 2015-05-12 DIAGNOSIS — K5730 Diverticulosis of large intestine without perforation or abscess without bleeding: Secondary | ICD-10-CM | POA: Diagnosis not present

## 2015-05-12 DIAGNOSIS — R1032 Left lower quadrant pain: Secondary | ICD-10-CM

## 2015-05-12 DIAGNOSIS — R140 Abdominal distension (gaseous): Secondary | ICD-10-CM | POA: Insufficient documentation

## 2015-05-12 DIAGNOSIS — K5732 Diverticulitis of large intestine without perforation or abscess without bleeding: Secondary | ICD-10-CM | POA: Diagnosis not present

## 2015-05-12 MED ORDER — IOHEXOL 300 MG/ML  SOLN: 50.0000 mL | Freq: Once | INTRAMUSCULAR | Status: DC | PRN

## 2015-05-12 MED ORDER — DIATRIZOATE MEGLUMINE & SODIUM 66-10 % PO SOLN
ORAL | Status: AC
  Administered 2015-05-12: 30 mL
  Filled 2015-05-12: qty 30

## 2015-05-12 MED ORDER — IOPAMIDOL (ISOVUE-370) INJECTION 76%: 100.0000 mL | Freq: Once | INTRAVENOUS | Status: DC | PRN

## 2015-05-12 MED ORDER — IOPAMIDOL (ISOVUE-300) INJECTION 61%
100.0000 mL | Freq: Once | INTRAVENOUS | Status: AC | PRN
  Administered 2015-05-12: 100 mL via INTRAVENOUS

## 2015-05-12 NOTE — Progress Notes (Signed)
1430 Called and spoke to Dr Lysbeth Galas, read him the report on the patient. He spoke directly to the patient before she left the hospital.

## 2015-05-31 ENCOUNTER — Encounter (HOSPITAL_COMMUNITY): Payer: Medicare Other

## 2015-06-01 ENCOUNTER — Encounter (HOSPITAL_COMMUNITY): Payer: BLUE CROSS/BLUE SHIELD

## 2015-06-28 ENCOUNTER — Encounter (HOSPITAL_COMMUNITY)
Admission: RE | Admit: 2015-06-28 | Discharge: 2015-06-28 | Disposition: A | Discharge status: Home / Self Care | Payer: Medicare Other | Source: Ambulatory Visit | Attending: Rheumatology | Admitting: Rheumatology

## 2015-06-28 ENCOUNTER — Encounter (HOSPITAL_COMMUNITY): Payer: Self-pay

## 2015-06-28 DIAGNOSIS — M069 Rheumatoid arthritis, unspecified: Secondary | ICD-10-CM | POA: Diagnosis present

## 2015-06-28 DIAGNOSIS — H209 Unspecified iridocyclitis: Secondary | ICD-10-CM | POA: Diagnosis not present

## 2015-06-28 HISTORY — DX: Diverticulitis of intestine, part unspecified, without perforation or abscess without bleeding: K57.92

## 2015-06-28 MED ORDER — SODIUM CHLORIDE 0.9 % IV SOLN
INTRAVENOUS | Status: DC
  Administered 2015-06-28: 12:00:00 via INTRAVENOUS

## 2015-06-28 MED ORDER — SODIUM CHLORIDE 0.9 % IV SOLN
750.0000 mg | INTRAVENOUS | Status: DC
  Administered 2015-06-28: 750 mg via INTRAVENOUS
  Filled 2015-06-28: qty 30

## 2015-06-28 MED ORDER — DIPHENHYDRAMINE HCL 25 MG PO CAPS
25.0000 mg | ORAL_CAPSULE | ORAL | Status: DC
  Administered 2015-06-28: 25 mg via ORAL
  Filled 2015-06-28: qty 1

## 2015-06-28 MED ORDER — ACETAMINOPHEN 325 MG PO TABS
650.0000 mg | ORAL_TABLET | ORAL | Status: DC
  Administered 2015-06-28: 650 mg via ORAL
  Filled 2015-06-28: qty 2

## 2015-06-28 NOTE — Progress Notes (Signed)
Uneventful infusion of Orencia. Pt discharged ambulatory unaccompanied to elevator to meet husband at car.

## 2015-07-26 ENCOUNTER — Encounter (HOSPITAL_COMMUNITY): Payer: Self-pay

## 2015-07-26 ENCOUNTER — Encounter (HOSPITAL_COMMUNITY)
Admission: RE | Admit: 2015-07-26 | Discharge: 2015-07-26 | Disposition: A | Discharge status: Home / Self Care | Payer: Medicare Other | Source: Ambulatory Visit | Attending: Rheumatology | Admitting: Rheumatology

## 2015-07-26 DIAGNOSIS — H209 Unspecified iridocyclitis: Secondary | ICD-10-CM | POA: Diagnosis present

## 2015-07-26 DIAGNOSIS — M069 Rheumatoid arthritis, unspecified: Secondary | ICD-10-CM | POA: Insufficient documentation

## 2015-07-26 LAB — CBC WITH DIFFERENTIAL/PLATELET
BASOS ABS: 0 10*3/uL (ref 0.0–0.1)
Basophils Relative: 0 %
EOS PCT: 4 %
Eosinophils Absolute: 0.3 10*3/uL (ref 0.0–0.7)
HCT: 44.6 % (ref 36.0–46.0)
Hemoglobin: 15.9 g/dL — ABNORMAL HIGH (ref 12.0–15.0)
Lymphocytes Relative: 26 %
Lymphs Abs: 2.2 10*3/uL (ref 0.7–4.0)
MCH: 30.9 pg (ref 26.0–34.0)
MCHC: 35.7 g/dL (ref 30.0–36.0)
MCV: 86.8 fL (ref 78.0–100.0)
MONOS PCT: 7 %
Monocytes Absolute: 0.6 10*3/uL (ref 0.1–1.0)
NEUTROS ABS: 5.5 10*3/uL (ref 1.7–7.7)
NEUTROS PCT: 63 %
PLATELETS: 172 10*3/uL (ref 150–400)
RBC: 5.14 MIL/uL — AB (ref 3.87–5.11)
RDW: 13.9 % (ref 11.5–15.5)
WBC: 8.6 10*3/uL (ref 4.0–10.5)

## 2015-07-26 LAB — COMPREHENSIVE METABOLIC PANEL
ALT: 28 U/L (ref 14–54)
AST: 24 U/L (ref 15–41)
Albumin: 4.1 g/dL (ref 3.5–5.0)
Alkaline Phosphatase: 52 U/L (ref 38–126)
Anion gap: 9 (ref 5–15)
BUN: 28 mg/dL — AB (ref 6–20)
CHLORIDE: 104 mmol/L (ref 101–111)
CO2: 24 mmol/L (ref 22–32)
CREATININE: 0.77 mg/dL (ref 0.44–1.00)
Calcium: 9.8 mg/dL (ref 8.9–10.3)
GFR calc Af Amer: >60 mL/min (ref >60)
GLUCOSE: 94 mg/dL (ref 65–99)
Potassium: 4 mmol/L (ref 3.5–5.1)
SODIUM: 137 mmol/L (ref 135–145)
Total Bilirubin: 1.1 mg/dL (ref 0.3–1.2)
Total Protein: 7.3 g/dL (ref 6.5–8.1)

## 2015-07-26 MED ORDER — DIPHENHYDRAMINE HCL 25 MG PO CAPS
25.0000 mg | ORAL_CAPSULE | ORAL | Status: DC
  Administered 2015-07-26: 25 mg via ORAL
  Filled 2015-07-26: qty 1

## 2015-07-26 MED ORDER — SODIUM CHLORIDE 0.9 % IV SOLN
750.0000 mg | INTRAVENOUS | Status: DC
  Administered 2015-07-26: 750 mg via INTRAVENOUS
  Filled 2015-07-26: qty 30

## 2015-07-26 MED ORDER — SODIUM CHLORIDE 0.9 % IV SOLN
INTRAVENOUS | Status: DC
  Administered 2015-07-26: 12:00:00 via INTRAVENOUS

## 2015-07-26 MED ORDER — ACETAMINOPHEN 325 MG PO TABS
650.0000 mg | ORAL_TABLET | ORAL | Status: DC
  Administered 2015-07-26: 650 mg via ORAL
  Filled 2015-07-26: qty 2

## 2015-07-26 NOTE — Discharge Instructions (Signed)
Abatacept solution for injection (subcutaneous or intravenous use)  What is this medicine?  ABATACEPT (a ba TA sept) is used to treat moderate to severe active rheumatoid arthritis in adults. This medicine is also used to treat juvenile idiopathic arthritis.  This medicine may be used for other purposes; ask your health care provider or pharmacist if you have questions.  What should I tell my health care provider before I take this medicine?  They need to know if you have any of these conditions:  -are taking other medicines to treat rheumatoid arthritis  -COPD  -diabetes  -infection or history of infections  -recently received or scheduled to receive a vaccine  -scheduled to have surgery  -tuberculosis, a positive skin test for tuberculosis or have recently been in close contact with someone who has tuberculosis  -viral hepatitis  -an unusual or allergic reaction to abatacept, other medicines, foods, dyes, or preservatives  -pregnant or trying to get pregnant  -breast-feeding  How should I use this medicine?  This medicine is for infusion into a vein or for injection under the skin. Infusions are given by a health care professional in a hospital or clinic setting. If you are to give your own medicine at home, you will be taught how to prepare and give this medicine under the skin. Use exactly as directed. Take your medicine at regular intervals. Do not take your medicine more often than directed.  It is important that you put your used needles and syringes in a special sharps container. Do not put them in a trash can. If you do not have a sharps container, call your pharmacist or healthcare provider to get one.  Talk to your pediatrician regarding the use of this medicine in children. While infusions in a clinic may be prescribed for children as young as 6 years for selected conditions, precautions do apply.  Overdosage: If you think you have taken too much of this medicine contact a poison control center or  emergency room at once.  NOTE: This medicine is only for you. Do not share this medicine with others.  What if I miss a dose?  This medicine is used once a week if given by injection under the skin. If you miss a dose, take it as soon as you can. If it is almost time for your next dose, take only that dose. Do not take double or extra doses.  If you are to be given an infusion, it is important not to miss your dose. Doses are usually every 4 weeks. Call your doctor or health care professional if you are unable to keep an appointment.  What may interact with this medicine?  Do not take this medicine with any of the following medications:  -adalimumab  -anakinra  -certolizumab  -etanercept  -golimumab  -infliximab  -live virus vaccines  -rituximab  -tocilizumab  This medicine may also interact with the following medications:  -vaccines  This list may not describe all possible interactions. Give your health care provider a list of all the medicines, herbs, non-prescription drugs, or dietary supplements you use. Also tell them if you smoke, drink alcohol, or use illegal drugs. Some items may interact with your medicine.  What should I watch for while using this medicine?  Visit your doctor for regular check ups while you are taking this medicine. Tell your doctor or healthcare professional if your symptoms do not start to get better or if they get worse.  Call your doctor or health   care professional if you get a cold or other infection while receiving this medicine. Do not treat yourself. This medicine may decrease your body's ability to fight infection. Try to avoid being around people who are sick.  What side effects may I notice from receiving this medicine?  Side effects that you should report to your doctor or health care professional as soon as possible:  -allergic reactions like skin rash, itching or hives, swelling of the face, lips, or tongue  -breathing problems  -chest pain  -signs of infection - fever or  chills, cough, unusual tiredness, pain or trouble passing urine, or warm, red or painful skin  Side effects that usually do not require medical attention (Report these to your doctor or health care professional if they continue or are bothersome.):  -dizziness  -headache  -nausea, vomiting  -sore throat  -stomach upset  This list may not describe all possible side effects. Call your doctor for medical advice about side effects. You may report side effects to FDA at 1-800-FDA-1088.  Where should I keep my medicine?  Infusions will be given in a hospital or clinic and will not be stored at home.  Storage for syringes given under the skin and stored at home:  Keep out of the reach of children. Store in a refrigerator between 2 and 8 degrees C (36 and 46 degrees F). Keep this medicine in the original container. Protect from light. Do not freeze. Throw away any unused medicine after the expiration date.  NOTE: This sheet is a summary. It may not cover all possible information. If you have questions about this medicine, talk to your doctor, pharmacist, or health care provider.     © 2016, [employer]. (2009-12-09 11:11:03)

## 2015-08-23 ENCOUNTER — Encounter (HOSPITAL_COMMUNITY)
Admission: RE | Admit: 2015-08-23 | Discharge: 2015-08-23 | Disposition: A | Discharge status: Home / Self Care | Payer: Medicare Other | Source: Ambulatory Visit | Attending: Rheumatology | Admitting: Rheumatology

## 2015-08-23 ENCOUNTER — Encounter (HOSPITAL_COMMUNITY): Payer: Self-pay

## 2015-08-23 DIAGNOSIS — M069 Rheumatoid arthritis, unspecified: Secondary | ICD-10-CM | POA: Diagnosis present

## 2015-08-23 DIAGNOSIS — H209 Unspecified iridocyclitis: Secondary | ICD-10-CM | POA: Insufficient documentation

## 2015-08-23 MED ORDER — SODIUM CHLORIDE 0.9 % IV SOLN
750.0000 mg | INTRAVENOUS | Status: DC
  Administered 2015-08-23: 750 mg via INTRAVENOUS
  Filled 2015-08-23: qty 30

## 2015-08-23 MED ORDER — SODIUM CHLORIDE 0.9 % IV SOLN
INTRAVENOUS | Status: DC
  Administered 2015-08-23: 13:00:00 via INTRAVENOUS

## 2015-08-23 MED ORDER — DIPHENHYDRAMINE HCL 25 MG PO TABS
25.0000 mg | ORAL_TABLET | ORAL | Status: DC
  Administered 2015-08-23: 25 mg via ORAL
  Filled 2015-08-23 (×2): qty 1

## 2015-08-23 MED ORDER — ACETAMINOPHEN 325 MG PO TABS
650.0000 mg | ORAL_TABLET | ORAL | Status: DC
  Administered 2015-08-23: 650 mg via ORAL
  Filled 2015-08-23: qty 2

## 2015-08-26 LAB — QUANTIFERON IN TUBE
QFT TB AG MINUS NIL VALUE: 0 IU/mL
QUANTIFERON MITOGEN VALUE: 8.79 IU/mL
QUANTIFERON TB AG VALUE: 0.03 [IU]/mL
QUANTIFERON TB GOLD: NEGATIVE
Quantiferon Nil Value: 0.03 IU/mL

## 2015-08-26 LAB — QUANTIFERON TB GOLD ASSAY (BLOOD)

## 2015-09-20 ENCOUNTER — Encounter (HOSPITAL_COMMUNITY): Payer: Self-pay

## 2015-09-20 ENCOUNTER — Encounter (HOSPITAL_COMMUNITY)
Admission: RE | Admit: 2015-09-20 | Discharge: 2015-09-20 | Disposition: A | Discharge status: Home / Self Care | Payer: Medicare Other | Source: Ambulatory Visit | Attending: Rheumatology | Admitting: Rheumatology

## 2015-09-20 DIAGNOSIS — H209 Unspecified iridocyclitis: Secondary | ICD-10-CM | POA: Insufficient documentation

## 2015-09-20 DIAGNOSIS — M069 Rheumatoid arthritis, unspecified: Secondary | ICD-10-CM | POA: Insufficient documentation

## 2015-09-20 LAB — COMPREHENSIVE METABOLIC PANEL
ALK PHOS: 59 U/L (ref 38–126)
ALT: 23 U/L (ref 14–54)
ANION GAP: 8 (ref 5–15)
AST: 34 U/L (ref 15–41)
Albumin: 4.3 g/dL (ref 3.5–5.0)
BILIRUBIN TOTAL: 1.4 mg/dL — AB (ref 0.3–1.2)
BUN: 28 mg/dL — ABNORMAL HIGH (ref 6–20)
CALCIUM: 9.5 mg/dL (ref 8.9–10.3)
CO2: 24 mmol/L (ref 22–32)
CREATININE: 0.7 mg/dL (ref 0.44–1.00)
Chloride: 103 mmol/L (ref 101–111)
Glucose, Bld: 94 mg/dL (ref 65–99)
Potassium: 5.6 mmol/L — ABNORMAL HIGH (ref 3.5–5.1)
SODIUM: 135 mmol/L (ref 135–145)
TOTAL PROTEIN: 7.5 g/dL (ref 6.5–8.1)

## 2015-09-20 LAB — CBC WITH DIFFERENTIAL/PLATELET
Basophils Absolute: 0 10*3/uL (ref 0.0–0.1)
Basophils Relative: 1 %
Eosinophils Absolute: 0.6 10*3/uL (ref 0.0–0.7)
Eosinophils Relative: 8 %
HEMATOCRIT: 46 % (ref 36.0–46.0)
HEMOGLOBIN: 16.3 g/dL — AB (ref 12.0–15.0)
LYMPHS ABS: 2.5 10*3/uL (ref 0.7–4.0)
LYMPHS PCT: 31 %
MCH: 31 pg (ref 26.0–34.0)
MCHC: 35.4 g/dL (ref 30.0–36.0)
MCV: 87.6 fL (ref 78.0–100.0)
MONOS PCT: 6 %
Monocytes Absolute: 0.5 10*3/uL (ref 0.1–1.0)
NEUTROS ABS: 4.4 10*3/uL (ref 1.7–7.7)
Neutrophils Relative %: 54 %
Platelets: 158 10*3/uL (ref 150–400)
RBC: 5.25 MIL/uL — AB (ref 3.87–5.11)
RDW: 13.9 % (ref 11.5–15.5)
WBC: 8 10*3/uL (ref 4.0–10.5)

## 2015-09-20 MED ORDER — DIPHENHYDRAMINE HCL 25 MG PO CAPS
25.0000 mg | ORAL_CAPSULE | ORAL | Status: DC
  Administered 2015-09-20: 25 mg via ORAL
  Filled 2015-09-20: qty 1

## 2015-09-20 MED ORDER — SODIUM CHLORIDE 0.9 % IV SOLN
750.0000 mg | INTRAVENOUS | Status: DC
  Administered 2015-09-20: 750 mg via INTRAVENOUS
  Filled 2015-09-20: qty 30

## 2015-09-20 MED ORDER — ACETAMINOPHEN 325 MG PO TABS
650.0000 mg | ORAL_TABLET | ORAL | Status: DC
  Administered 2015-09-20: 650 mg via ORAL
  Filled 2015-09-20: qty 2

## 2015-09-20 MED ORDER — SODIUM CHLORIDE 0.9 % IV SOLN
INTRAVENOUS | Status: DC
  Administered 2015-09-20: 11:00:00 via INTRAVENOUS

## 2015-09-20 NOTE — Discharge Instructions (Signed)
Abatacept solution for injection (subcutaneous or intravenous use)  What is this medicine?  ABATACEPT (a ba TA sept) is used to treat moderate to severe active rheumatoid arthritis in adults. This medicine is also used to treat juvenile idiopathic arthritis.  This medicine may be used for other purposes; ask your health care provider or pharmacist if you have questions.  What should I tell my health care provider before I take this medicine?  They need to know if you have any of these conditions:  -are taking other medicines to treat rheumatoid arthritis  -COPD  -diabetes  -infection or history of infections  -recently received or scheduled to receive a vaccine  -scheduled to have surgery  -tuberculosis, a positive skin test for tuberculosis or have recently been in close contact with someone who has tuberculosis  -viral hepatitis  -an unusual or allergic reaction to abatacept, other medicines, foods, dyes, or preservatives  -pregnant or trying to get pregnant  -breast-feeding  How should I use this medicine?  This medicine is for infusion into a vein or for injection under the skin. Infusions are given by a health care professional in a hospital or clinic setting. If you are to give your own medicine at home, you will be taught how to prepare and give this medicine under the skin. Use exactly as directed. Take your medicine at regular intervals. Do not take your medicine more often than directed.  It is important that you put your used needles and syringes in a special sharps container. Do not put them in a trash can. If you do not have a sharps container, call your pharmacist or healthcare provider to get one.  Talk to your pediatrician regarding the use of this medicine in children. While infusions in a clinic may be prescribed for children as young as 6 years for selected conditions, precautions do apply.  Overdosage: If you think you have taken too much of this medicine contact a poison control center or  emergency room at once.  NOTE: This medicine is only for you. Do not share this medicine with others.  What if I miss a dose?  This medicine is used once a week if given by injection under the skin. If you miss a dose, take it as soon as you can. If it is almost time for your next dose, take only that dose. Do not take double or extra doses.  If you are to be given an infusion, it is important not to miss your dose. Doses are usually every 4 weeks. Call your doctor or health care professional if you are unable to keep an appointment.  What may interact with this medicine?  Do not take this medicine with any of the following medications:  -adalimumab  -anakinra  -certolizumab  -etanercept  -golimumab  -infliximab  -live virus vaccines  -rituximab  -tocilizumab  This medicine may also interact with the following medications:  -vaccines  This list may not describe all possible interactions. Give your health care provider a list of all the medicines, herbs, non-prescription drugs, or dietary supplements you use. Also tell them if you smoke, drink alcohol, or use illegal drugs. Some items may interact with your medicine.  What should I watch for while using this medicine?  Visit your doctor for regular check ups while you are taking this medicine. Tell your doctor or healthcare professional if your symptoms do not start to get better or if they get worse.  Call your doctor or health   care professional if you get a cold or other infection while receiving this medicine. Do not treat yourself. This medicine may decrease your body's ability to fight infection. Try to avoid being around people who are sick.  What side effects may I notice from receiving this medicine?  Side effects that you should report to your doctor or health care professional as soon as possible:  -allergic reactions like skin rash, itching or hives, swelling of the face, lips, or tongue  -breathing problems  -chest pain  -signs of infection - fever or  chills, cough, unusual tiredness, pain or trouble passing urine, or warm, red or painful skin  Side effects that usually do not require medical attention (Report these to your doctor or health care professional if they continue or are bothersome.):  -dizziness  -headache  -nausea, vomiting  -sore throat  -stomach upset  This list may not describe all possible side effects. Call your doctor for medical advice about side effects. You may report side effects to FDA at 1-800-FDA-1088.  Where should I keep my medicine?  Infusions will be given in a hospital or clinic and will not be stored at home.  Storage for syringes given under the skin and stored at home:  Keep out of the reach of children. Store in a refrigerator between 2 and 8 degrees C (36 and 46 degrees F). Keep this medicine in the original container. Protect from light. Do not freeze. Throw away any unused medicine after the expiration date.  NOTE: This sheet is a summary. It may not cover all possible information. If you have questions about this medicine, talk to your doctor, pharmacist, or health care provider.     © 2016, [employer]. (2009-12-09 11:11:03)

## 2015-10-10 ENCOUNTER — Encounter: Payer: Self-pay | Admitting: Radiology

## 2015-10-10 DIAGNOSIS — K5732 Diverticulitis of large intestine without perforation or abscess without bleeding: Secondary | ICD-10-CM

## 2015-10-10 DIAGNOSIS — K5792 Diverticulitis of intestine, part unspecified, without perforation or abscess without bleeding: Secondary | ICD-10-CM | POA: Insufficient documentation

## 2015-10-10 DIAGNOSIS — M0609 Rheumatoid arthritis without rheumatoid factor, multiple sites: Secondary | ICD-10-CM | POA: Insufficient documentation

## 2015-10-10 DIAGNOSIS — H209 Unspecified iridocyclitis: Secondary | ICD-10-CM

## 2015-10-18 ENCOUNTER — Encounter (HOSPITAL_COMMUNITY): Payer: Self-pay

## 2015-10-18 ENCOUNTER — Encounter (HOSPITAL_COMMUNITY)
Admission: RE | Admit: 2015-10-18 | Discharge: 2015-10-18 | Disposition: A | Discharge status: Home / Self Care | Payer: Medicare Other | Source: Ambulatory Visit | Attending: Rheumatology | Admitting: Rheumatology

## 2015-10-18 DIAGNOSIS — H209 Unspecified iridocyclitis: Secondary | ICD-10-CM | POA: Insufficient documentation

## 2015-10-18 DIAGNOSIS — M069 Rheumatoid arthritis, unspecified: Secondary | ICD-10-CM | POA: Insufficient documentation

## 2015-10-18 MED ORDER — SODIUM CHLORIDE 0.9 % IV SOLN
750.0000 mg | INTRAVENOUS | Status: DC
  Administered 2015-10-18: 750 mg via INTRAVENOUS
  Filled 2015-10-18: qty 30

## 2015-10-18 MED ORDER — DIPHENHYDRAMINE HCL 25 MG PO CAPS
25.0000 mg | ORAL_CAPSULE | ORAL | Status: DC
  Administered 2015-10-18: 25 mg via ORAL
  Filled 2015-10-18: qty 1

## 2015-10-18 MED ORDER — SODIUM CHLORIDE 0.9 % IV SOLN
INTRAVENOUS | Status: DC
  Administered 2015-10-18: 13:00:00 via INTRAVENOUS

## 2015-10-18 MED ORDER — ACETAMINOPHEN 325 MG PO TABS
650.0000 mg | ORAL_TABLET | ORAL | Status: DC
  Administered 2015-10-18: 650 mg via ORAL
  Filled 2015-10-18: qty 2

## 2015-11-15 ENCOUNTER — Encounter (HOSPITAL_COMMUNITY)
Admission: RE | Admit: 2015-11-15 | Discharge: 2015-11-15 | Disposition: A | Discharge status: Home / Self Care | Payer: Medicare Other | Source: Ambulatory Visit | Attending: Rheumatology | Admitting: Rheumatology

## 2015-11-15 ENCOUNTER — Encounter (HOSPITAL_COMMUNITY): Payer: Self-pay

## 2015-11-15 DIAGNOSIS — H209 Unspecified iridocyclitis: Secondary | ICD-10-CM | POA: Diagnosis present

## 2015-11-15 DIAGNOSIS — M069 Rheumatoid arthritis, unspecified: Secondary | ICD-10-CM | POA: Insufficient documentation

## 2015-11-15 LAB — COMPREHENSIVE METABOLIC PANEL
ALK PHOS: 55 U/L (ref 38–126)
ALT: 24 U/L (ref 14–54)
ANION GAP: 9 (ref 5–15)
AST: 22 U/L (ref 15–41)
Albumin: 4.1 g/dL (ref 3.5–5.0)
BILIRUBIN TOTAL: 0.6 mg/dL (ref 0.3–1.2)
BUN: 23 mg/dL — ABNORMAL HIGH (ref 6–20)
CALCIUM: 9.2 mg/dL (ref 8.9–10.3)
CO2: 22 mmol/L (ref 22–32)
Chloride: 106 mmol/L (ref 101–111)
Creatinine, Ser: 0.72 mg/dL (ref 0.44–1.00)
GFR calc non Af Amer: >60 mL/min (ref >60)
Glucose, Bld: 90 mg/dL (ref 65–99)
Potassium: 3.8 mmol/L (ref 3.5–5.1)
Sodium: 137 mmol/L (ref 135–145)
TOTAL PROTEIN: 6.9 g/dL (ref 6.5–8.1)

## 2015-11-15 LAB — CBC WITH DIFFERENTIAL/PLATELET
Basophils Absolute: 0 10*3/uL (ref 0.0–0.1)
Basophils Relative: 0 %
EOS ABS: 0.5 10*3/uL (ref 0.0–0.7)
Eosinophils Relative: 8 %
HEMATOCRIT: 41.9 % (ref 36.0–46.0)
HEMOGLOBIN: 15 g/dL (ref 12.0–15.0)
LYMPHS ABS: 2.2 10*3/uL (ref 0.7–4.0)
Lymphocytes Relative: 32 %
MCH: 30.9 pg (ref 26.0–34.0)
MCHC: 35.8 g/dL (ref 30.0–36.0)
MCV: 86.4 fL (ref 78.0–100.0)
MONO ABS: 0.5 10*3/uL (ref 0.1–1.0)
MONOS PCT: 7 %
NEUTROS ABS: 3.7 10*3/uL (ref 1.7–7.7)
NEUTROS PCT: 53 %
Platelets: 177 10*3/uL (ref 150–400)
RBC: 4.85 MIL/uL (ref 3.87–5.11)
RDW: 14.1 % (ref 11.5–15.5)
WBC: 6.9 10*3/uL (ref 4.0–10.5)

## 2015-11-15 MED ORDER — DIPHENHYDRAMINE HCL 25 MG PO CAPS
25.0000 mg | ORAL_CAPSULE | ORAL | Status: AC
  Administered 2015-11-15: 25 mg via ORAL
  Filled 2015-11-15: qty 1

## 2015-11-15 MED ORDER — ACETAMINOPHEN 325 MG PO TABS
650.0000 mg | ORAL_TABLET | ORAL | Status: AC
  Administered 2015-11-15: 650 mg via ORAL
  Filled 2015-11-15: qty 2

## 2015-11-15 MED ORDER — SODIUM CHLORIDE 0.9 % IV SOLN
INTRAVENOUS | Status: AC
  Administered 2015-11-15: 11:00:00 via INTRAVENOUS

## 2015-11-15 MED ORDER — SODIUM CHLORIDE 0.9 % IV SOLN
750.0000 mg | INTRAVENOUS | Status: AC
  Administered 2015-11-15: 750 mg via INTRAVENOUS
  Filled 2015-11-15: qty 30

## 2015-12-05 ENCOUNTER — Telehealth: Payer: Self-pay | Admitting: Radiology

## 2015-12-05 ENCOUNTER — Other Ambulatory Visit: Payer: Self-pay | Admitting: Rheumatology

## 2015-12-05 DIAGNOSIS — Z79899 Other long term (current) drug therapy: Secondary | ICD-10-CM

## 2015-12-05 DIAGNOSIS — M0609 Rheumatoid arthritis without rheumatoid factor, multiple sites: Secondary | ICD-10-CM

## 2015-12-05 NOTE — Telephone Encounter (Signed)
Kiara Miller called for orders/ pt there now for infusion / I have reordered her [place] [facility], will you please sign the orders as soon as possible  Last visit 09/13/15 Labs and TB gold up to date Next visit not scheduled have sent message to front desk for sch

## 2015-12-05 NOTE — Telephone Encounter (Signed)
ok 

## 2015-12-05 NOTE — Telephone Encounter (Signed)
Patient needs follow up appt with Dr Deveshwar pls call to make appt. Dec  

## 2015-12-13 ENCOUNTER — Encounter (HOSPITAL_COMMUNITY)
Admission: RE | Admit: 2015-12-13 | Discharge: 2015-12-13 | Disposition: A | Discharge status: Home / Self Care | Payer: Medicare Other | Source: Ambulatory Visit | Attending: Rheumatology | Admitting: Rheumatology

## 2015-12-13 ENCOUNTER — Encounter (HOSPITAL_COMMUNITY): Payer: Self-pay

## 2015-12-13 DIAGNOSIS — H209 Unspecified iridocyclitis: Secondary | ICD-10-CM | POA: Insufficient documentation

## 2015-12-13 DIAGNOSIS — M0609 Rheumatoid arthritis without rheumatoid factor, multiple sites: Secondary | ICD-10-CM

## 2015-12-13 DIAGNOSIS — M069 Rheumatoid arthritis, unspecified: Secondary | ICD-10-CM | POA: Insufficient documentation

## 2015-12-13 DIAGNOSIS — Z79899 Other long term (current) drug therapy: Secondary | ICD-10-CM

## 2015-12-13 MED ORDER — ACETAMINOPHEN 325 MG PO TABS
650.0000 mg | ORAL_TABLET | Freq: Once | ORAL | Status: AC
  Administered 2015-12-13: 650 mg via ORAL
  Filled 2015-12-13: qty 2

## 2015-12-13 MED ORDER — SODIUM CHLORIDE 0.9 % IV SOLN
750.0000 mg | INTRAVENOUS | Status: DC
  Administered 2015-12-13: 750 mg via INTRAVENOUS
  Filled 2015-12-13: qty 30

## 2015-12-13 MED ORDER — DIPHENHYDRAMINE HCL 25 MG PO CAPS
25.0000 mg | ORAL_CAPSULE | Freq: Once | ORAL | Status: AC
  Administered 2015-12-13: 25 mg via ORAL
  Filled 2015-12-13: qty 1

## 2015-12-13 MED ORDER — SODIUM CHLORIDE 0.9 % IV SOLN
INTRAVENOUS | Status: DC
  Administered 2015-12-13: 12:00:00 via INTRAVENOUS

## 2015-12-13 NOTE — Discharge Instructions (Signed)
Abatacept solution for injection (subcutaneous or intravenous use)  What is this medicine?  ABATACEPT (a ba TA sept) is used to treat moderate to severe active rheumatoid arthritis in adults. This medicine is also used to treat juvenile idiopathic arthritis.  This medicine may be used for other purposes; ask your health care provider or pharmacist if you have questions.  What should I tell my health care provider before I take this medicine?  They need to know if you have any of these conditions:  -are taking other medicines to treat rheumatoid arthritis  -COPD  -diabetes  -infection or history of infections  -recently received or scheduled to receive a vaccine  -scheduled to have surgery  -tuberculosis, a positive skin test for tuberculosis or have recently been in close contact with someone who has tuberculosis  -viral hepatitis  -an unusual or allergic reaction to abatacept, other medicines, foods, dyes, or preservatives  -pregnant or trying to get pregnant  -breast-feeding  How should I use this medicine?  This medicine is for infusion into a vein or for injection under the skin. Infusions are given by a health care professional in a hospital or clinic setting. If you are to give your own medicine at home, you will be taught how to prepare and give this medicine under the skin. Use exactly as directed. Take your medicine at regular intervals. Do not take your medicine more often than directed.  It is important that you put your used needles and syringes in a special sharps container. Do not put them in a trash can. If you do not have a sharps container, call your pharmacist or healthcare provider to get one.  Talk to your pediatrician regarding the use of this medicine in children. While infusions in a clinic may be prescribed for children as young as 6 years for selected conditions, precautions do apply.  Overdosage: If you think you have taken too much of this medicine contact a poison control center or  emergency room at once.  NOTE: This medicine is only for you. Do not share this medicine with others.  What if I miss a dose?  This medicine is used once a week if given by injection under the skin. If you miss a dose, take it as soon as you can. If it is almost time for your next dose, take only that dose. Do not take double or extra doses.  If you are to be given an infusion, it is important not to miss your dose. Doses are usually every 4 weeks. Call your doctor or health care professional if you are unable to keep an appointment.  What may interact with this medicine?  Do not take this medicine with any of the following medications:  -adalimumab  -anakinra  -certolizumab  -etanercept  -golimumab  -infliximab  -live virus vaccines  -rituximab  -tocilizumab  This medicine may also interact with the following medications:  -vaccines  This list may not describe all possible interactions. Give your health care provider a list of all the medicines, herbs, non-prescription drugs, or dietary supplements you use. Also tell them if you smoke, drink alcohol, or use illegal drugs. Some items may interact with your medicine.  What should I watch for while using this medicine?  Visit your doctor for regular check ups while you are taking this medicine. Tell your doctor or healthcare professional if your symptoms do not start to get better or if they get worse.  Call your doctor or health   care professional if you get a cold or other infection while receiving this medicine. Do not treat yourself. This medicine may decrease your body's ability to fight infection. Try to avoid being around people who are sick.  What side effects may I notice from receiving this medicine?  Side effects that you should report to your doctor or health care professional as soon as possible:  -allergic reactions like skin rash, itching or hives, swelling of the face, lips, or tongue  -breathing problems  -chest pain  -signs of infection - fever or  chills, cough, unusual tiredness, pain or trouble passing urine, or warm, red or painful skin  Side effects that usually do not require medical attention (Report these to your doctor or health care professional if they continue or are bothersome.):  -dizziness  -headache  -nausea, vomiting  -sore throat  -stomach upset  This list may not describe all possible side effects. Call your doctor for medical advice about side effects. You may report side effects to FDA at 1-800-FDA-1088.  Where should I keep my medicine?  Infusions will be given in a hospital or clinic and will not be stored at home.  Storage for syringes given under the skin and stored at home:  Keep out of the reach of children. Store in a refrigerator between 2 and 8 degrees C (36 and 46 degrees F). Keep this medicine in the original container. Protect from light. Do not freeze. Throw away any unused medicine after the expiration date.  NOTE: This sheet is a summary. It may not cover all possible information. If you have questions about this medicine, talk to your doctor, pharmacist, or health care provider.     © 2016, [employer]. (2009-12-09 11:11:03)

## 2016-01-09 ENCOUNTER — Telehealth: Payer: Self-pay | Admitting: Rheumatology

## 2016-01-09 NOTE — Telephone Encounter (Signed)
Patient states she is due for Orencia tomorrow and she has had a cold for the past week. Should she still take Orencia or hold off since she has been sick? Please call patient today.

## 2016-01-10 ENCOUNTER — Telehealth: Payer: Self-pay | Admitting: Rheumatology

## 2016-01-10 ENCOUNTER — Encounter (HOSPITAL_COMMUNITY): Admission: RE | Admit: 2016-01-10 | Payer: Medicare Other | Source: Ambulatory Visit

## 2016-01-10 NOTE — Telephone Encounter (Signed)
Patient advised of recommendations and verbalized understanding.  

## 2016-01-10 NOTE — Telephone Encounter (Signed)
Attempted to contact the patient and left message for patient to call the office.  

## 2016-01-10 NOTE — Telephone Encounter (Signed)
Patient cancelled her infusion for today because of her not feeling well. Patient saw PCP yesterday and was told she had a cold. Patient was not given any antibiotics. She is suffering with congestion and postnasal drip and cough.  She would like to know if she should reschedule this infusion for a couple of weeks or skip this infusion and wait until February 08, 2016 when her next infusion will be due?

## 2016-01-10 NOTE — Telephone Encounter (Signed)
Patient returned Kiara Miller's phone call. Please call patient.  

## 2016-01-10 NOTE — Telephone Encounter (Signed)
She may reschedule infusion once the infection clears up

## 2016-02-01 ENCOUNTER — Telehealth: Payer: Self-pay | Admitting: Rheumatology

## 2016-02-01 NOTE — Telephone Encounter (Signed)
Called her she states she CXL infusion 01/10/16 She is RS for Fri  Has had UTI Macrobid /Bactrim DS  Advised her to wait to infuse until she has cleared the UTI / she has voiced understanding.

## 2016-02-01 NOTE — Telephone Encounter (Signed)
Patient called to give Amy the following information: Patient Canceled infusion at [facility] 02/08/16. She will Finish antibiotics 02/09/16. Patient rescheduled orencia infusion for 02/16/16 and patient is going to move from [facility] to [facility]. Patient states orders will expire on 02/08/16, so patient will need new orders sent to [facility]

## 2016-02-01 NOTE — Telephone Encounter (Signed)
Patient left a voicemail stating she would like to speak to someone about the symptoms she is having. Please call patient.

## 2016-02-01 NOTE — Telephone Encounter (Signed)
Thank you Marissa, will update the orders

## 2016-02-02 ENCOUNTER — Other Ambulatory Visit: Payer: Self-pay | Admitting: Radiology

## 2016-02-02 DIAGNOSIS — M0609 Rheumatoid arthritis without rheumatoid factor, multiple sites: Secondary | ICD-10-CM

## 2016-02-08 ENCOUNTER — Encounter (HOSPITAL_COMMUNITY): Admission: RE | Admit: 2016-02-08 | Payer: Medicare Other | Source: Ambulatory Visit

## 2016-02-10 ENCOUNTER — Other Ambulatory Visit: Payer: Self-pay | Admitting: Family Medicine

## 2016-02-10 DIAGNOSIS — Z1231 Encounter for screening mammogram for malignant neoplasm of breast: Secondary | ICD-10-CM

## 2016-02-16 ENCOUNTER — Other Ambulatory Visit: Payer: Self-pay | Admitting: *Deleted

## 2016-02-16 ENCOUNTER — Encounter (HOSPITAL_COMMUNITY)
Admission: RE | Admit: 2016-02-16 | Discharge: 2016-02-16 | Disposition: A | Discharge status: Home / Self Care | Payer: Medicare Other | Source: Ambulatory Visit | Attending: Rheumatology | Admitting: Rheumatology

## 2016-02-16 DIAGNOSIS — M069 Rheumatoid arthritis, unspecified: Secondary | ICD-10-CM | POA: Diagnosis not present

## 2016-02-16 LAB — CBC WITH DIFFERENTIAL/PLATELET
BASOS PCT: 1 %
Basophils Absolute: 0 10*3/uL (ref 0.0–0.1)
EOS ABS: 0.4 10*3/uL (ref 0.0–0.7)
EOS PCT: 6 %
HCT: 45.5 % (ref 36.0–46.0)
Hemoglobin: 15.6 g/dL — ABNORMAL HIGH (ref 12.0–15.0)
LYMPHS ABS: 2.5 10*3/uL (ref 0.7–4.0)
Lymphocytes Relative: 37 %
MCH: 30.5 pg (ref 26.0–34.0)
MCHC: 34.3 g/dL (ref 30.0–36.0)
MCV: 89 fL (ref 78.0–100.0)
Monocytes Absolute: 0.4 10*3/uL (ref 0.1–1.0)
Monocytes Relative: 7 %
NEUTROS PCT: 49 %
Neutro Abs: 3.4 10*3/uL (ref 1.7–7.7)
PLATELETS: 197 10*3/uL (ref 150–400)
RBC: 5.11 MIL/uL (ref 3.87–5.11)
RDW: 13.8 % (ref 11.5–15.5)
WBC: 6.8 10*3/uL (ref 4.0–10.5)

## 2016-02-16 LAB — COMPREHENSIVE METABOLIC PANEL
ALBUMIN: 4.1 g/dL (ref 3.5–5.0)
ALT: 27 U/L (ref 14–54)
ANION GAP: 10 (ref 5–15)
AST: 25 U/L (ref 15–41)
Alkaline Phosphatase: 55 U/L (ref 38–126)
BUN: 28 mg/dL — ABNORMAL HIGH (ref 6–20)
CALCIUM: 9.6 mg/dL (ref 8.9–10.3)
CHLORIDE: 101 mmol/L (ref 101–111)
CO2: 23 mmol/L (ref 22–32)
Creatinine, Ser: 0.67 mg/dL (ref 0.44–1.00)
GFR calc non Af Amer: >60 mL/min (ref >60)
GLUCOSE: 81 mg/dL (ref 65–99)
POTASSIUM: 4.1 mmol/L (ref 3.5–5.1)
Sodium: 134 mmol/L — ABNORMAL LOW (ref 135–145)
Total Bilirubin: 0.6 mg/dL (ref 0.3–1.2)
Total Protein: 7.3 g/dL (ref 6.5–8.1)

## 2016-02-16 MED ORDER — DIPHENHYDRAMINE HCL 25 MG PO CAPS
25.0000 mg | ORAL_CAPSULE | Freq: Once | ORAL | Status: AC
  Administered 2016-02-16: 25 mg via ORAL

## 2016-02-16 MED ORDER — ACETAMINOPHEN 325 MG PO TABS
650.0000 mg | ORAL_TABLET | Freq: Once | ORAL | Status: AC
  Administered 2016-02-16: 650 mg via ORAL

## 2016-02-16 MED ORDER — DIPHENHYDRAMINE HCL 25 MG PO CAPS
ORAL_CAPSULE | ORAL | Status: AC
  Filled 2016-02-16: qty 1

## 2016-02-16 MED ORDER — ACETAMINOPHEN 325 MG PO TABS
ORAL_TABLET | ORAL | Status: AC
  Filled 2016-02-16: qty 2

## 2016-02-16 MED ORDER — SODIUM CHLORIDE 0.9 % IV SOLN
750.0000 mg | Freq: Once | INTRAVENOUS | Status: AC
  Administered 2016-02-16: 750 mg via INTRAVENOUS
  Filled 2016-02-16: qty 30

## 2016-02-16 MED ORDER — SODIUM CHLORIDE 0.9 % IV SOLN
INTRAVENOUS | Status: DC
Start: 2016-02-16 — End: 2016-02-17
  Administered 2016-02-16: 250 mL via INTRAVENOUS

## 2016-02-16 NOTE — Progress Notes (Signed)
Results for TRINITI, GRUETZMACHER (MRN [id number]) as of 02/16/2016 13:12  Ref. Range 02/16/2016 10:52  COMPREHENSIVE METABOLIC PANEL Unknown Rpt (A)  Sodium Latest Ref Range: 135 - 145 mmol/L 134 (L)  Potassium Latest Ref Range: 3.5 - 5.1 mmol/L 4.1  Chloride Latest Ref Range: 101 - 111 mmol/L 101  CO2 Latest Ref Range: 22 - 32 mmol/L 23  Glucose Latest Ref Range: 65 - 99 mg/dL 81  BUN Latest Ref Range: 6 - 20 mg/dL 28 (H)  Creatinine Latest Ref Range: 0.44 - 1.00 mg/dL 0.67  Calcium Latest Ref Range: 8.9 - 10.3 mg/dL 9.6  Anion gap Latest Ref Range: 5 - 15  10  Alkaline Phosphatase Latest Ref Range: 38 - 126 U/L 55  Albumin Latest Ref Range: 3.5 - 5.0 g/dL 4.1  AST Latest Ref Range: 15 - 41 U/L 25  ALT Latest Ref Range: 14 - 54 U/L 27  Total Protein Latest Ref Range: 6.5 - 8.1 g/dL 7.3  Total Bilirubin Latest Ref Range: 0.3 - 1.2 mg/dL 0.6  EGFR (African American) Latest Ref Range: >60 mL/min >60  EGFR (Non-African Amer.) Latest Ref Range: >60 mL/min >60  WBC Latest Ref Range: 4.0 - 10.5 K/uL 6.8  RBC Latest Ref Range: 3.87 - 5.11 MIL/uL 5.11  Hemoglobin Latest Ref Range: 12.0 - 15.0 g/dL 15.6 (H)  HCT Latest Ref Range: 36.0 - 46.0 % 45.5  MCV Latest Ref Range: 78.0 - 100.0 fL 89.0  MCH Latest Ref Range: 26.0 - 34.0 pg 30.5  MCHC Latest Ref Range: 30.0 - 36.0 g/dL 34.3  RDW Latest Ref Range: 11.5 - 15.5 % 13.8  Platelets Latest Ref Range: 150 - 400 K/uL 197  Neutrophils Latest Units: % 49  Lymphocytes Latest Units: % 37  Monocytes Relative Latest Units: % 7  Eosinophil Latest Units: % 6  Basophil Latest Units: % 1  NEUT# Latest Ref Range: 1.7 - 7.7 K/uL 3.4  Lymphocyte # Latest Ref Range: 0.7 - 4.0 K/uL 2.5  Monocyte # Latest Ref Range: 0.1 - 1.0 K/uL 0.4  Eosinophils Absolute Latest Ref Range: 0.0 - 0.7 K/uL 0.4  Basophils Absolute Latest Ref Range: 0.0 - 0.1 K/uL 0.0

## 2016-02-16 NOTE — Progress Notes (Signed)
Labs within normal limits

## 2016-02-23 ENCOUNTER — Ambulatory Visit: Payer: Self-pay | Admitting: Rheumatology

## 2016-02-24 ENCOUNTER — Ambulatory Visit (INDEPENDENT_AMBULATORY_CARE_PROVIDER_SITE_OTHER): Payer: Medicare Other | Admitting: Rheumatology

## 2016-02-24 ENCOUNTER — Encounter: Payer: Self-pay | Admitting: Rheumatology

## 2016-02-24 VITALS — BP 102/61 | HR 67 | Ht 73.0 in | Wt 150.0 lb

## 2016-02-24 DIAGNOSIS — Z79899 Other long term (current) drug therapy: Secondary | ICD-10-CM | POA: Diagnosis not present

## 2016-02-24 DIAGNOSIS — M79641 Pain in right hand: Secondary | ICD-10-CM

## 2016-02-24 DIAGNOSIS — M170 Bilateral primary osteoarthritis of knee: Secondary | ICD-10-CM

## 2016-02-24 DIAGNOSIS — M0609 Rheumatoid arthritis without rheumatoid factor, multiple sites: Secondary | ICD-10-CM

## 2016-02-24 DIAGNOSIS — M79642 Pain in left hand: Secondary | ICD-10-CM

## 2016-02-24 NOTE — Progress Notes (Signed)
Office Visit Note  Patient: Kiara Miller             Date of Birth: 21-May-1950           MRN: [id number]             PCP: Sherrie Mustache, MD Referring: Dione Housekeeper, MD Visit Date: 02/24/2016 Occupation: _0 @    Subjective:  No chief complaint on file. Follow-up on seronegative rheumatoid arthritis  History of Present Illness: Kiara Miller is a 66 y.o. female  Last seen 09/13/2015 Patient is doing well overall with rheumatoid arthritis.  Had to skip to December dose because of a cold. Had to delay her January infusion of Orencia due to a UTI. Did get her Orencia IV 02/16/2016 and did well. She does get pretreatment.  She is getting her labs at time of infusion.  She also has worked very hard to lose weight. According to the patient in January 2017 she was about 206 pounds. On today's visit she is about 150 pounds. She has worked very hard in observing her proper diet and exercising by walking.  She has a history of OA of the knee joint and the right is worse than the left the neck continues to be some discomfort at times. She also has occasional left ankle joint pain.    Activities of Daily Living:  Patient reports morning stiffness for 30 minutes.   Patient Denies nocturnal pain.  Difficulty dressing/grooming: Denies Difficulty climbing stairs: Denies Difficulty getting out of chair: Denies Difficulty using hands for taps, buttons, cutlery, and/or writing: Denies   Review of Systems  Constitutional: Negative for fatigue.  HENT: Negative for mouth sores and mouth dryness.   Eyes: Negative for dryness.  Respiratory: Negative for shortness of breath.   Gastrointestinal: Negative for constipation and diarrhea.  Musculoskeletal: Negative for myalgias and myalgias.  Skin: Negative for sensitivity to sunlight.  Psychiatric/Behavioral: Negative for decreased concentration and sleep disturbance.    PMFS History:  Patient Active Problem List   Diagnosis Date Noted  . Rheumatoid arthritis of multiple sites without rheumatoid factor ([place]) 10/10/2015  . Uveitis 10/10/2015  . Diverticulitis 10/10/2015  . Dyspnea 12/05/2010  . Palpitations 12/05/2010    Past Medical History:  Diagnosis Date  . Diverticulitis April 2017  . Hyperlipidemia   . Hypertension   . Joint pain   . Polyarthralgia   . Right knee pain     Family History  Problem Relation Age of Onset  . Hypertension Father    Past Surgical History:  Procedure Laterality Date  . ABDOMINAL HYSTERECTOMY    . CARPAL TUNNEL RELEASE    . KNEE ARTHROSCOPY    . MANDIBLE SURGERY    . OSTEOTOMY PELVIS BILATERAL    . TONSILLECTOMY    . TUBAL LIGATION    . WRIST FUSION     Social History   Social History Narrative  . No narrative on file     Objective: Vital Signs: BP 102/61 (BP Location: Left Arm, Patient Position: Sitting, Cuff Size: Normal)   Pulse 67   Ht 6' 1" (1.854 m)   Wt 150 lb (68 kg)   BMI 19.79 kg/m    Physical Exam  Constitutional: She is oriented to person, place, and time. She appears well-developed and well-nourished.  HENT:  Head: Normocephalic and atraumatic.  Eyes: EOM are normal. Pupils are equal, round, and reactive to light.  Cardiovascular: Normal rate, regular rhythm and normal heart sounds.  Exam reveals  no gallop and no friction rub.   No murmur heard. Pulmonary/Chest: Effort normal and breath sounds normal. She has no wheezes. She has no rales.  Abdominal: Soft. Bowel sounds are normal. She exhibits no distension. There is no tenderness. There is no guarding. No hernia.  Musculoskeletal: Normal range of motion. She exhibits no edema, tenderness or deformity.  Lymphadenopathy:    She has no cervical adenopathy.  Neurological: She is alert and oriented to person, place, and time. Coordination normal.  Skin: Skin is warm and dry. Capillary refill takes less than 2 seconds. No rash noted.  Psychiatric: She has a normal mood and affect.  Her behavior is normal.  Nursing note and vitals reviewed.    Musculoskeletal Exam:  Full range of motion of all joints except fusion of the left wrist joints with decreased range of motion Grip strength is equal and strong bilaterally Fibromyalgia tender points are all absent   CDAI Exam: CDAI Homunculus Exam:   Joint Counts:  CDAI Tender Joint count: 0 CDAI Swollen Joint count: 0   No synovitis on exam   Investigation: No additional findings.  Hospital Outpatient Visit on 02/16/2016  Component Date Value Ref Range Status  . WBC 02/16/2016 6.8  4.0 - 10.5 K/uL Final  . RBC 02/16/2016 5.11  3.87 - 5.11 MIL/uL Final  . Hemoglobin 02/16/2016 15.6* 12.0 - 15.0 g/dL Final  . HCT 02/16/2016 45.5  36.0 - 46.0 % Final  . MCV 02/16/2016 89.0  78.0 - 100.0 fL Final  . MCH 02/16/2016 30.5  26.0 - 34.0 pg Final  . MCHC 02/16/2016 34.3  30.0 - 36.0 g/dL Final  . RDW 02/16/2016 13.8  11.5 - 15.5 % Final  . Platelets 02/16/2016 197  150 - 400 K/uL Final  . Neutrophils Relative % 02/16/2016 49  % Final  . Neutro Abs 02/16/2016 3.4  1.7 - 7.7 K/uL Final  . Lymphocytes Relative 02/16/2016 37  % Final  . Lymphs Abs 02/16/2016 2.5  0.7 - 4.0 K/uL Final  . Monocytes Relative 02/16/2016 7  % Final  . Monocytes Absolute 02/16/2016 0.4  0.1 - 1.0 K/uL Final  . Eosinophils Relative 02/16/2016 6  % Final  . Eosinophils Absolute 02/16/2016 0.4  0.0 - 0.7 K/uL Final  . Basophils Relative 02/16/2016 1  % Final  . Basophils Absolute 02/16/2016 0.0  0.0 - 0.1 K/uL Final  . Sodium 02/16/2016 134* 135 - 145 mmol/L Final  . Potassium 02/16/2016 4.1  3.5 - 5.1 mmol/L Final  . Chloride 02/16/2016 101  101 - 111 mmol/L Final  . CO2 02/16/2016 23  22 - 32 mmol/L Final  . Glucose, Bld 02/16/2016 81  65 - 99 mg/dL Final  . BUN 02/16/2016 28* 6 - 20 mg/dL Final  . Creatinine, Ser 02/16/2016 0.67  0.44 - 1.00 mg/dL Final  . Calcium 02/16/2016 9.6  8.9 - 10.3 mg/dL Final  . Total Protein 02/16/2016 7.3   6.5 - 8.1 g/dL Final  . Albumin 02/16/2016 4.1  3.5 - 5.0 g/dL Final  . AST 02/16/2016 25  15 - 41 U/L Final  . ALT 02/16/2016 27  14 - 54 U/L Final  . Alkaline Phosphatase 02/16/2016 55  38 - 126 U/L Final  . Total Bilirubin 02/16/2016 0.6  0.3 - 1.2 mg/dL Final  . GFR calc non Af Amer 02/16/2016 >60  >60 mL/min Final  . GFR calc Af Amer 02/16/2016 >60  >60 mL/min Final   Comment: (NOTE) The eGFR has  been calculated using the CKD EPI equation. This calculation has not been validated in all clinical situations. eGFR's persistently <60 mL/min signify possible Chronic Kidney Disease.   Georgiann Hahn gap 02/16/2016 10  5 - 15 Final  Hospital Outpatient Visit on 11/15/2015  Component Date Value Ref Range Status  . WBC 11/15/2015 6.9  4.0 - 10.5 K/uL Final  . RBC 11/15/2015 4.85  3.87 - 5.11 MIL/uL Final  . Hemoglobin 11/15/2015 15.0  12.0 - 15.0 g/dL Final  . HCT 11/15/2015 41.9  36.0 - 46.0 % Final  . MCV 11/15/2015 86.4  78.0 - 100.0 fL Final  . MCH 11/15/2015 30.9  26.0 - 34.0 pg Final  . MCHC 11/15/2015 35.8  30.0 - 36.0 g/dL Final  . RDW 11/15/2015 14.1  11.5 - 15.5 % Final  . Platelets 11/15/2015 177  150 - 400 K/uL Final  . Neutrophils Relative % 11/15/2015 53  % Final  . Neutro Abs 11/15/2015 3.7  1.7 - 7.7 K/uL Final  . Lymphocytes Relative 11/15/2015 32  % Final  . Lymphs Abs 11/15/2015 2.2  0.7 - 4.0 K/uL Final  . Monocytes Relative 11/15/2015 7  % Final  . Monocytes Absolute 11/15/2015 0.5  0.1 - 1.0 K/uL Final  . Eosinophils Relative 11/15/2015 8  % Final  . Eosinophils Absolute 11/15/2015 0.5  0.0 - 0.7 K/uL Final  . Basophils Relative 11/15/2015 0  % Final  . Basophils Absolute 11/15/2015 0.0  0.0 - 0.1 K/uL Final  . Sodium 11/15/2015 137  135 - 145 mmol/L Final  . Potassium 11/15/2015 3.8  3.5 - 5.1 mmol/L Final  . Chloride 11/15/2015 106  101 - 111 mmol/L Final  . CO2 11/15/2015 22  22 - 32 mmol/L Final  . Glucose, Bld 11/15/2015 90  65 - 99 mg/dL Final  . BUN  11/15/2015 23* 6 - 20 mg/dL Final  . Creatinine, Ser 11/15/2015 0.72  0.44 - 1.00 mg/dL Final  . Calcium 11/15/2015 9.2  8.9 - 10.3 mg/dL Final  . Total Protein 11/15/2015 6.9  6.5 - 8.1 g/dL Final  . Albumin 11/15/2015 4.1  3.5 - 5.0 g/dL Final  . AST 11/15/2015 22  15 - 41 U/L Final  . ALT 11/15/2015 24  14 - 54 U/L Final  . Alkaline Phosphatase 11/15/2015 55  38 - 126 U/L Final  . Total Bilirubin 11/15/2015 0.6  0.3 - 1.2 mg/dL Final  . GFR calc non Af Amer 11/15/2015 >60  >60 mL/min Final  . GFR calc Af Amer 11/15/2015 >60  >60 mL/min Final   Comment: (NOTE) The eGFR has been calculated using the CKD EPI equation. This calculation has not been validated in all clinical situations. eGFR's persistently <60 mL/min signify possible Chronic Kidney Disease.   . Anion gap 11/15/2015 9  5 - 15 Final     Imaging: No results found.  Speciality Comments: No specialty comments available.    Procedures:  No procedures performed Allergies: Clindamycin/lincomycin; Codeine; Levofloxacin; Penicillins; Statins; and Tetracyclines & related   Assessment / Plan:     Visit Diagnoses: Rheumatoid arthritis, seronegative, multiple sites ([place])  High risk medications (not anticoagulants) long-term use  Primary osteoarthritis of both knees  Bilateral hand pain   Plan:  #1: Continue Orencia IV for rheumatoid arthritis. Adequate response. Continue pretreatments.  #2: Ongoing OA knee joint pain. No change in treatment. Patient has worked hard to lose weight and since January 2017, she has done really well with weight loss.  #3:  Patient is having minor joint pain secondary to delaying her Orencia IV in January and missing her dose of December, she does not require prednisone at this time. She also declines prednisone taper and will call us if she needs  #4: Order infusions through [facility] and continue pretreatments. Patient needs labs every 2 months starting in March  Orders: No  orders of the defined types were placed in this encounter.  No orders of the defined types were placed in this encounter.   Face-to-face time spent with patient was 30 minutes. 50% of time was spent in counseling and coordination of care.  Follow-Up Instructions: Return in about 5 months (around 07/24/2016) for RA, orencia iv q 4 wks, oakj, cmc pain, oa hands.   Eliezer Lofts, PA-C  Note - This record has been created using [employer].  Chart creation errors have been sought, but may not always  have been located. Such creation errors do not reflect on  the standard of medical care.

## 2016-03-02 ENCOUNTER — Other Ambulatory Visit: Payer: Self-pay | Admitting: Radiology

## 2016-03-02 DIAGNOSIS — M0609 Rheumatoid arthritis without rheumatoid factor, multiple sites: Secondary | ICD-10-CM

## 2016-03-02 DIAGNOSIS — H209 Unspecified iridocyclitis: Secondary | ICD-10-CM

## 2016-03-02 MED ORDER — DIPHENHYDRAMINE HCL 25 MG PO CAPS: 25.0000 mg | ORAL_CAPSULE | ORAL | Status: DC

## 2016-03-02 MED ORDER — SODIUM CHLORIDE 0.9 % IV SOLN: 750.0000 mg | INTRAVENOUS | Status: DC

## 2016-03-02 MED ORDER — ACETAMINOPHEN 325 MG PO TABS: 650.0000 mg | ORAL_TABLET | ORAL | Status: DC

## 2016-03-06 ENCOUNTER — Encounter (HOSPITAL_COMMUNITY): Payer: Medicare Other

## 2016-03-15 ENCOUNTER — Encounter (HOSPITAL_COMMUNITY)
Admission: RE | Admit: 2016-03-15 | Discharge: 2016-03-15 | Disposition: A | Discharge status: Home / Self Care | Payer: Medicare Other | Source: Ambulatory Visit | Attending: Rheumatology | Admitting: Rheumatology

## 2016-03-15 ENCOUNTER — Encounter (HOSPITAL_COMMUNITY): Payer: Self-pay

## 2016-03-15 DIAGNOSIS — M0680 Other specified rheumatoid arthritis, unspecified site: Secondary | ICD-10-CM | POA: Insufficient documentation

## 2016-03-15 MED ORDER — SODIUM CHLORIDE 0.9 % IV SOLN
Freq: Once | INTRAVENOUS | Status: AC
  Administered 2016-03-15: 250 mL via INTRAVENOUS

## 2016-03-15 MED ORDER — SODIUM CHLORIDE 0.9 % IV SOLN
750.0000 mg | Freq: Once | INTRAVENOUS | Status: AC
  Administered 2016-03-15: 750 mg via INTRAVENOUS
  Filled 2016-03-15: qty 30

## 2016-03-20 ENCOUNTER — Ambulatory Visit
Admission: RE | Admit: 2016-03-20 | Discharge: 2016-03-20 | Disposition: A | Discharge status: Home / Self Care | Payer: Medicare Other | Source: Ambulatory Visit | Attending: Family Medicine | Admitting: Family Medicine

## 2016-03-20 DIAGNOSIS — Z1231 Encounter for screening mammogram for malignant neoplasm of breast: Secondary | ICD-10-CM

## 2016-04-03 ENCOUNTER — Other Ambulatory Visit: Payer: Self-pay | Admitting: Radiology

## 2016-04-03 ENCOUNTER — Encounter (HOSPITAL_COMMUNITY): Payer: Medicare Other

## 2016-04-03 DIAGNOSIS — M0609 Rheumatoid arthritis without rheumatoid factor, multiple sites: Secondary | ICD-10-CM

## 2016-04-03 NOTE — Progress Notes (Signed)
Orders placed for Orencia CBC CMP every 2 mo tylenol and benadryl with each infusion

## 2016-04-12 ENCOUNTER — Encounter (HOSPITAL_COMMUNITY)
Admission: RE | Admit: 2016-04-12 | Discharge: 2016-04-12 | Disposition: A | Discharge status: Home / Self Care | Payer: Medicare Other | Source: Ambulatory Visit | Attending: Rheumatology | Admitting: Rheumatology

## 2016-04-12 DIAGNOSIS — M0609 Rheumatoid arthritis without rheumatoid factor, multiple sites: Secondary | ICD-10-CM | POA: Insufficient documentation

## 2016-04-12 LAB — CBC WITH DIFFERENTIAL/PLATELET
Basophils Absolute: 0 10*3/uL (ref 0.0–0.1)
Basophils Relative: 0 %
Eosinophils Absolute: 0.4 10*3/uL (ref 0.0–0.7)
Eosinophils Relative: 5 %
HEMATOCRIT: 44.5 % (ref 36.0–46.0)
HEMOGLOBIN: 15.6 g/dL — AB (ref 12.0–15.0)
LYMPHS ABS: 2.6 10*3/uL (ref 0.7–4.0)
Lymphocytes Relative: 33 %
MCH: 31.3 pg (ref 26.0–34.0)
MCHC: 35.1 g/dL (ref 30.0–36.0)
MCV: 89.4 fL (ref 78.0–100.0)
MONOS PCT: 8 %
Monocytes Absolute: 0.6 10*3/uL (ref 0.1–1.0)
NEUTROS ABS: 4.2 10*3/uL (ref 1.7–7.7)
NEUTROS PCT: 54 %
Platelets: 193 10*3/uL (ref 150–400)
RBC: 4.98 MIL/uL (ref 3.87–5.11)
RDW: 14.4 % (ref 11.5–15.5)
WBC: 7.8 10*3/uL (ref 4.0–10.5)

## 2016-04-12 LAB — COMPREHENSIVE METABOLIC PANEL
ALK PHOS: 50 U/L (ref 38–126)
ALT: 23 U/L (ref 14–54)
ANION GAP: 9 (ref 5–15)
AST: 18 U/L (ref 15–41)
Albumin: 4 g/dL (ref 3.5–5.0)
BILIRUBIN TOTAL: 0.6 mg/dL (ref 0.3–1.2)
BUN: 21 mg/dL — ABNORMAL HIGH (ref 6–20)
CALCIUM: 9.7 mg/dL (ref 8.9–10.3)
CO2: 25 mmol/L (ref 22–32)
Chloride: 100 mmol/L — ABNORMAL LOW (ref 101–111)
Creatinine, Ser: 0.67 mg/dL (ref 0.44–1.00)
GLUCOSE: 91 mg/dL (ref 65–99)
Potassium: 3.9 mmol/L (ref 3.5–5.1)
Sodium: 134 mmol/L — ABNORMAL LOW (ref 135–145)
TOTAL PROTEIN: 7.1 g/dL (ref 6.5–8.1)

## 2016-04-12 MED ORDER — SODIUM CHLORIDE 0.9 % IV SOLN
INTRAVENOUS | Status: DC
  Administered 2016-04-12: 13:00:00 via INTRAVENOUS

## 2016-04-12 MED ORDER — ACETAMINOPHEN 325 MG PO TABS
650.0000 mg | ORAL_TABLET | ORAL | Status: DC
  Administered 2016-04-12: 650 mg via ORAL

## 2016-04-12 MED ORDER — ABATACEPT 250 MG IV SOLR
750.0000 mg | INTRAVENOUS | Status: DC
  Administered 2016-04-12: 750 mg via INTRAVENOUS
  Filled 2016-04-12: qty 30

## 2016-04-12 MED ORDER — DIPHENHYDRAMINE HCL 25 MG PO CAPS
ORAL_CAPSULE | ORAL | Status: AC
  Filled 2016-04-12: qty 1

## 2016-04-12 MED ORDER — ACETAMINOPHEN 325 MG PO TABS
ORAL_TABLET | ORAL | Status: AC
  Filled 2016-04-12: qty 2

## 2016-04-12 MED ORDER — DIPHENHYDRAMINE HCL 25 MG PO CAPS
25.0000 mg | ORAL_CAPSULE | ORAL | Status: DC
  Administered 2016-04-12: 25 mg via ORAL

## 2016-04-12 NOTE — Progress Notes (Signed)
Labs stable

## 2016-04-12 NOTE — Progress Notes (Signed)
Results for UMI, MAINOR (MRN [id number]) as of 04/12/2016 15:34  Ref. Range 04/12/2016 12:12  Sodium Latest Ref Range: 135 - 145 mmol/L 134 (L)  Potassium Latest Ref Range: 3.5 - 5.1 mmol/L 3.9  Chloride Latest Ref Range: 101 - 111 mmol/L 100 (L)  CO2 Latest Ref Range: 22 - 32 mmol/L 25  Glucose Latest Ref Range: 65 - 99 mg/dL 91  BUN Latest Ref Range: 6 - 20 mg/dL 21 (H)  Creatinine Latest Ref Range: 0.44 - 1.00 mg/dL 0.67  Calcium Latest Ref Range: 8.9 - 10.3 mg/dL 9.7  Anion gap Latest Ref Range: 5 - 15  9  Alkaline Phosphatase Latest Ref Range: 38 - 126 U/L 50  Albumin Latest Ref Range: 3.5 - 5.0 g/dL 4.0  AST Latest Ref Range: 15 - 41 U/L 18  ALT Latest Ref Range: 14 - 54 U/L 23  Total Protein Latest Ref Range: 6.5 - 8.1 g/dL 7.1  Total Bilirubin Latest Ref Range: 0.3 - 1.2 mg/dL 0.6  EGFR (African American) Latest Ref Range: >60 mL/min >60  EGFR (Non-African Amer.) Latest Ref Range: >60 mL/min >60  WBC Latest Ref Range: 4.0 - 10.5 K/uL 7.8  RBC Latest Ref Range: 3.87 - 5.11 MIL/uL 4.98  Hemoglobin Latest Ref Range: 12.0 - 15.0 g/dL 15.6 (H)  HCT Latest Ref Range: 36.0 - 46.0 % 44.5  MCV Latest Ref Range: 78.0 - 100.0 fL 89.4  MCH Latest Ref Range: 26.0 - 34.0 pg 31.3  MCHC Latest Ref Range: 30.0 - 36.0 g/dL 35.1  RDW Latest Ref Range: 11.5 - 15.5 % 14.4  Platelets Latest Ref Range: 150 - 400 K/uL 193  Neutrophils Latest Units: % 54  Lymphocytes Latest Units: % 33  Monocytes Relative Latest Units: % 8  Eosinophil Latest Units: % 5  Basophil Latest Units: % 0  NEUT# Latest Ref Range: 1.7 - 7.7 K/uL 4.2  Lymphocyte # Latest Ref Range: 0.7 - 4.0 K/uL 2.6  Monocyte # Latest Ref Range: 0.1 - 1.0 K/uL 0.6  Eosinophils Absolute Latest Ref Range: 0.0 - 0.7 K/uL 0.4  Basophils Absolute Latest Ref Range: 0.0 - 0.1 K/uL 0.0   Orencia 750 mg IV given per order.

## 2016-05-03 ENCOUNTER — Other Ambulatory Visit: Payer: Self-pay | Admitting: Radiology

## 2016-05-03 DIAGNOSIS — M0609 Rheumatoid arthritis without rheumatoid factor, multiple sites: Secondary | ICD-10-CM

## 2016-05-10 ENCOUNTER — Encounter (HOSPITAL_COMMUNITY)
Admission: RE | Admit: 2016-05-10 | Discharge: 2016-05-10 | Disposition: A | Discharge status: Home / Self Care | Payer: Medicare Other | Source: Ambulatory Visit | Attending: Rheumatology | Admitting: Rheumatology

## 2016-05-10 ENCOUNTER — Encounter (HOSPITAL_COMMUNITY): Payer: Self-pay

## 2016-05-10 DIAGNOSIS — M0609 Rheumatoid arthritis without rheumatoid factor, multiple sites: Secondary | ICD-10-CM

## 2016-05-10 MED ORDER — ACETAMINOPHEN 325 MG PO TABS: 650.0000 mg | ORAL_TABLET | ORAL | Status: DC

## 2016-05-10 MED ORDER — DIPHENHYDRAMINE HCL 25 MG PO CAPS: 25.0000 mg | ORAL_CAPSULE | ORAL | Status: DC

## 2016-05-10 MED ORDER — SODIUM CHLORIDE 0.9 % IV SOLN
INTRAVENOUS | Status: DC
  Administered 2016-05-10: 11:00:00 via INTRAVENOUS

## 2016-05-10 MED ORDER — SODIUM CHLORIDE 0.9 % IV SOLN
750.0000 mg | INTRAVENOUS | Status: DC
  Administered 2016-05-10: 750 mg via INTRAVENOUS
  Filled 2016-05-10: qty 30

## 2016-05-25 ENCOUNTER — Other Ambulatory Visit: Payer: Self-pay | Admitting: Radiology

## 2016-05-25 DIAGNOSIS — M0609 Rheumatoid arthritis without rheumatoid factor, multiple sites: Secondary | ICD-10-CM

## 2016-05-25 MED ORDER — SODIUM CHLORIDE 0.9 % IV SOLN: 750.0000 mg | INTRAVENOUS | Status: DC

## 2016-05-25 MED ORDER — DIPHENHYDRAMINE HCL 25 MG PO CAPS: 25.0000 mg | ORAL_CAPSULE | Freq: Once | ORAL | Status: DC

## 2016-05-25 MED ORDER — ACETAMINOPHEN 325 MG PO TABS: 650.0000 mg | ORAL_TABLET | ORAL | Status: DC

## 2016-05-25 NOTE — Progress Notes (Signed)
02/24/16 last visit next visit 07/24/16 Labs with infusions standing orders  updated TB gold neg 08/23/15

## 2016-06-07 ENCOUNTER — Telehealth: Payer: Self-pay | Admitting: Rheumatology

## 2016-06-07 ENCOUNTER — Encounter (HOSPITAL_COMMUNITY)
Admission: RE | Admit: 2016-06-07 | Discharge: 2016-06-07 | Disposition: A | Discharge status: Home / Self Care | Payer: Medicare Other | Source: Ambulatory Visit | Attending: Rheumatology | Admitting: Rheumatology

## 2016-06-07 ENCOUNTER — Encounter (HOSPITAL_COMMUNITY): Payer: Self-pay

## 2016-06-07 DIAGNOSIS — H209 Unspecified iridocyclitis: Secondary | ICD-10-CM | POA: Insufficient documentation

## 2016-06-07 DIAGNOSIS — M069 Rheumatoid arthritis, unspecified: Secondary | ICD-10-CM | POA: Insufficient documentation

## 2016-06-07 LAB — COMPREHENSIVE METABOLIC PANEL
ALK PHOS: 53 U/L (ref 38–126)
ALT: 23 U/L (ref 14–54)
ANION GAP: 8 (ref 5–15)
AST: 22 U/L (ref 15–41)
Albumin: 4.1 g/dL (ref 3.5–5.0)
BILIRUBIN TOTAL: 0.7 mg/dL (ref 0.3–1.2)
BUN: 25 mg/dL — ABNORMAL HIGH (ref 6–20)
CALCIUM: 9.6 mg/dL (ref 8.9–10.3)
CO2: 26 mmol/L (ref 22–32)
CREATININE: 0.81 mg/dL (ref 0.44–1.00)
Chloride: 103 mmol/L (ref 101–111)
GFR calc non Af Amer: >60 mL/min (ref >60)
Glucose, Bld: 85 mg/dL (ref 65–99)
Potassium: 3.8 mmol/L (ref 3.5–5.1)
Sodium: 137 mmol/L (ref 135–145)
TOTAL PROTEIN: 7.2 g/dL (ref 6.5–8.1)

## 2016-06-07 LAB — CBC
HEMATOCRIT: 46.7 % — AB (ref 36.0–46.0)
HEMOGLOBIN: 16.2 g/dL — AB (ref 12.0–15.0)
MCH: 31.3 pg (ref 26.0–34.0)
MCHC: 34.7 g/dL (ref 30.0–36.0)
MCV: 90.2 fL (ref 78.0–100.0)
Platelets: 174 10*3/uL (ref 150–400)
RBC: 5.18 MIL/uL — ABNORMAL HIGH (ref 3.87–5.11)
RDW: 13.7 % (ref 11.5–15.5)
WBC: 6.5 10*3/uL (ref 4.0–10.5)

## 2016-06-07 MED ORDER — SODIUM CHLORIDE 0.9 % IV SOLN
Freq: Once | INTRAVENOUS | Status: AC
  Administered 2016-06-07: 11:00:00 via INTRAVENOUS

## 2016-06-07 MED ORDER — SODIUM CHLORIDE 0.9 % IV SOLN
750.0000 mg | Freq: Once | INTRAVENOUS | Status: AC
  Administered 2016-06-07: 750 mg via INTRAVENOUS
  Filled 2016-06-07: qty 30

## 2016-06-07 NOTE — Progress Notes (Signed)
Labs are stable.

## 2016-06-07 NOTE — Telephone Encounter (Signed)
Patient advised of lab results and verbalized understanding.  

## 2016-06-07 NOTE — Telephone Encounter (Signed)
Patient was returning Andrea's call about lab results. Please call patient back.

## 2016-06-08 ENCOUNTER — Other Ambulatory Visit: Payer: Self-pay | Admitting: Radiology

## 2016-06-08 DIAGNOSIS — M0609 Rheumatoid arthritis without rheumatoid factor, multiple sites: Secondary | ICD-10-CM

## 2016-06-08 NOTE — Progress Notes (Signed)
Orencia Infusion orders updated today including CBC CMP Tylenol and Benadryl appointments are up to date and follow up appointment is scheduled TB gold is due on July 2018

## 2016-07-05 ENCOUNTER — Encounter (HOSPITAL_COMMUNITY)
Admission: RE | Admit: 2016-07-05 | Discharge: 2016-07-05 | Disposition: A | Discharge status: Home / Self Care | Payer: Medicare Other | Source: Ambulatory Visit | Attending: Rheumatology | Admitting: Rheumatology

## 2016-07-05 DIAGNOSIS — M0609 Rheumatoid arthritis without rheumatoid factor, multiple sites: Secondary | ICD-10-CM

## 2016-07-05 DIAGNOSIS — M069 Rheumatoid arthritis, unspecified: Secondary | ICD-10-CM | POA: Diagnosis not present

## 2016-07-05 MED ORDER — SODIUM CHLORIDE 0.9% FLUSH
INTRAVENOUS | Status: AC
  Filled 2016-07-05: qty 10

## 2016-07-05 MED ORDER — SODIUM CHLORIDE 0.9 % IV SOLN
750.0000 mg | INTRAVENOUS | Status: DC
  Administered 2016-07-05: 750 mg via INTRAVENOUS
  Filled 2016-07-05: qty 30

## 2016-07-05 MED ORDER — ACETAMINOPHEN 325 MG PO TABS
650.0000 mg | ORAL_TABLET | ORAL | Status: DC
  Filled 2016-07-05: qty 2

## 2016-07-05 MED ORDER — DIPHENHYDRAMINE HCL 25 MG PO CAPS
25.0000 mg | ORAL_CAPSULE | ORAL | Status: DC
  Filled 2016-07-05: qty 1

## 2016-07-24 ENCOUNTER — Encounter: Payer: Self-pay | Admitting: Rheumatology

## 2016-07-24 ENCOUNTER — Ambulatory Visit (INDEPENDENT_AMBULATORY_CARE_PROVIDER_SITE_OTHER): Payer: Medicare Other | Admitting: Rheumatology

## 2016-07-24 VITALS — BP 104/68 | HR 74 | Resp 14 | Ht 61.5 in | Wt 148.0 lb

## 2016-07-24 DIAGNOSIS — M170 Bilateral primary osteoarthritis of knee: Secondary | ICD-10-CM | POA: Diagnosis not present

## 2016-07-24 DIAGNOSIS — M79642 Pain in left hand: Secondary | ICD-10-CM | POA: Diagnosis not present

## 2016-07-24 DIAGNOSIS — M0609 Rheumatoid arthritis without rheumatoid factor, multiple sites: Secondary | ICD-10-CM | POA: Diagnosis not present

## 2016-07-24 DIAGNOSIS — M79641 Pain in right hand: Secondary | ICD-10-CM

## 2016-07-24 DIAGNOSIS — Z79899 Other long term (current) drug therapy: Secondary | ICD-10-CM

## 2016-07-24 DIAGNOSIS — H209 Unspecified iridocyclitis: Secondary | ICD-10-CM | POA: Diagnosis not present

## 2016-07-24 NOTE — Patient Instructions (Signed)
Knee Osteoarthritis Treatment Decision Aid Introduction If you have osteoarthritis in your knees, you may have several treatment options. This document will review two of these options.  What are my treatment options? ? Surgery ? Non-surgical treatment may include one option or a combination of several of the following options:: Surgery for this condition is total knee replacement. ? Exercise programs and working with a physical therapist.: This is a procedure to replace the knee joint with an artificial (prosthetic) knee joint. ? Devices to help you move around (assistive devices), like a brace, wrap, splint, or wedge insoles.: It replaces parts of the thigh bone (femur), lower leg bone (tibia), and kneecap (patella) that are removed during the procedure. ? Medicines.: ? Shots of medicine to relieve pain and inflammation, such as steroids and hyaluronic acid.: ? Using a small device which delivers mild pulses through to nerve endings to relieve pain (transcutaneous electrical stimulation, TENS).: ? Applying heat and cold to the knee.: ? Massage.: ? Insertion of needles into certain places on the skin to relieve pain (acupuncture).: ? A plan to control your weight or to lose weight, if you are overweight.:  Who is eligible?  Good candidates may include: ? Surgery ? People who have pain that is tolerable.: People who have pain that does not get better with non-surgical treatments and medicines. ? People who are not allergic to medicines that are used.: People who have moderate or severe pain that makes it hard to do physical activities like walking, rising from a chair, or using stairs. ? People who do not have an illness that makes medicines or injections risky.: People who have pain that affects quality of sleep. ? People who are motivated to do exercise programs or lose weight.: People who do not have an illness that makes surgery risky. ? People who do not have an infection, too much  fluid, or skin breakdown in the knee area.: What are the benefits?  Benefits may include: ? Surgery ? Improved mobility and less pain.: Improved mobility and less pain. ? Not needing surgery.: Pain likely being gone within 2 years after surgery. ? Pain relief that lasts for 6-12 months, starting 1-2 days after steroid injections.: ? Pain relief that lasts for several months, starting several weeks after hyaluronic acid injections.: What are the risks?  Risks may include: ? Surgery ? Failure to relieve symptoms.: Failure to relieve symptoms. ? Joint damage that may continue to get worse.: Instability of the knee. ? Medicine or injection side effects.: Damage to blood vessels, nerves, or other structures in the knee. ? Bruising or bleeding with acupuncture therapy.: Bleeding or a blood clot. ? Skin injury due to incorrect application of heat or cold therapy.: Infection. ? Bruising due to massage.: Decreased range of motion of the knee. ? Increased pain due to exercise programs.: Loosening of the prosthetic joint. ? : Needing knee replacement surgery again in 15-20 years to replace the prosthetic joint. What preparation is needed?  Preparation may include: ? Surgery ? Physical evaluation. This may involve lab work and imaging tests, such as X-rays, MRI, CT scan, or bone scans.: Physical evaluation. This may involve lab work and imaging tests, such as X-rays, MRI, CT scan, or bone scans. ? Planning to have someone take you home from the hospital or clinic, if you have knee injections.: Planning to have someone take you home from the hospital. ? : Arranging for someone to help you for a few days after surgery. ? : Working   with specialists, if you have other medical conditions. ? : Preparing your home for your recovery. ? : Losing weight, if recommended by your provider. ? : Not using any products that contain nicotine or tobacco, such as cigarettes and e-cigarettes. If you need help  quitting, ask your health care provider. ? : Doing exercises as directed by your provider. ? : Having dental care and routine cleanings completed before your procedure. (You should not have dental work done for 3 months after your procedure.) The exact tests and frequency of office visits will be decided by your health care provider based on your individual condition. What are the restrictions after?  Restrictions may include: ? Surgery ? Needing to rest after pain-relief therapies.: Not taking baths, swimming, or using a hot tub until your provider approves. ? Not driving, if you are taking medicines that make you sleepy.: Not playing contact sports until your provider approves. ? Avoiding activities that require a lot of energy for a couple of days after knee injections.: Not driving or using heavy machinery until your provider approves. What can I expect from recovery?  You may experience: ? Surgery ? Tiredness, after therapies for pain relief.: Pain. Medicines and pain relief techniques will help make pain tolerable. ? Needing to move your knee through its full range of motion after knee injections, to get all of the medicine into your knee joint.: Fluid draining from your incision through a tube (drain) for a couple of days. ? Monitoring for a few hours after knee injections to make sure you do not have a reaction to the medicine.: Limited range of motion in the knee that gradually improves over time. What is the impact to quality of life?  Impact to quality of life may include: ? Surgery ? Living with certain side effects of medicines.: Needing to stay in the hospital for a few days after your procedure. ? Needing up to five injections over a period of five weeks, for certain kinds of hyaluronic acid injections.: Needing to use a walker for several weeks. ? Limited physical activity, depending on your symptoms.: Limited physical activity for several weeks. ? Impact to your daily  schedule, including work, to make time for therapy and follow-up visits.: Impact to your daily schedule, including work, to make time for therapy and follow-up visits. ? : ? : The frequency of office visits, therapy, and injections will be decided by your health care provider and care team based on your individual condition. What is the cost?  Costs may include: ? Surgery - $$$ ? Ongoing medicines.: Procedures and recovery. ? Office visits.: Office visits. ? Therapy visits.: Therapy visits. ? Knee injection procedures.: ? Assistive devices.: Exact costs will vary based on your location and insurance plan. Contact your insurance company to find out what is covered. Questions to ask your health care provider:  Ask: ? Surgery ? Am I eligible for this option?: Am I eligible for this option? ? What are my personal risks?: What are my personal risks? ? How often will I need office or therapy visits?: How often will I need office or therapy visits? ? What are the chances that I will need future treatment?: What are the chances that I will need another surgery? ? : How effective is the surgery? Follow these instructions at home: Review these options and decide which one may be right for you.  My decision: ? Non-Surgical Treatment ? This is right for me: ? This is not right for   me: ? I am unsure right now: ? Surgery ? This is right for me: ? This is not right for me: ? I am unsure right now:  This information is not intended to replace advice given to you by your health care provider. Make sure you discuss any questions you have with your health care provider. Document Released: 12/15/2015 Document Revised: 12/15/2015 Document Reviewed: 12/15/2015 Elsevier Interactive Patient Education  2018 [employer]  

## 2016-07-24 NOTE — Progress Notes (Signed)
Office Visit Note  Patient: Kiara Miller             Date of Birth: December 25, 1950           MRN: [id number]             PCP: Dione Housekeeper, MD Referring: Dione Housekeeper, MD Visit Date: 07/24/2016 Occupation: _0 @    Subjective:  Medication Management  History of Present Illness: Kiara Miller is a 66 y.o. female  Last seen Feb 24, 2016 for RA. Patient has a history of seronegative rheumatoid arthritis  Hx of uveitis/iritis in past; no flare at this time.  Using orencia iv q 4 weeks (adequate response).  C/o right > left knee pain. And left ankle pain > right ankle pain. Due to above, pt has balance issues.   Activities of Daily Living:  Patient reports morning stiffness for 15 minutes.   Patient Reports nocturnal pain.  Difficulty dressing/grooming: Reports Difficulty climbing stairs: Reports Difficulty getting out of chair: Reports Difficulty using hands for taps, buttons, cutlery, and/or writing: Reports   Review of Systems  Constitutional: Negative for fatigue.  HENT: Negative for mouth sores and mouth dryness.   Eyes: Negative for dryness.  Respiratory: Negative for shortness of breath.   Gastrointestinal: Negative for constipation and diarrhea.  Musculoskeletal: Negative for myalgias and myalgias.  Skin: Negative for sensitivity to sunlight.  Psychiatric/Behavioral: Negative for decreased concentration and sleep disturbance.    PMFS History:  Patient Active Problem List   Diagnosis Date Noted  . Rheumatoid arthritis of multiple sites without rheumatoid factor ([place]) 10/10/2015  . Uveitis 10/10/2015  . Diverticulitis 10/10/2015  . Dyspnea 12/05/2010  . Palpitations 12/05/2010    Past Medical History:  Diagnosis Date  . Diverticulitis April 2017  . Hyperlipidemia   . Hypertension   . Joint pain   . Polyarthralgia   . Right knee pain     Family History  Problem Relation Age of Onset  . Hypertension Father    Past Surgical History:    Procedure Laterality Date  . ABDOMINAL HYSTERECTOMY    . CARPAL TUNNEL RELEASE    . KNEE ARTHROSCOPY    . MANDIBLE SURGERY    . OSTEOTOMY PELVIS BILATERAL    . TONSILLECTOMY    . TUBAL LIGATION    . WRIST FUSION     Social History   Social History Narrative  . No narrative on file     Objective: Vital Signs: BP 104/68   Pulse 74   Resp 14   Ht 5' 1.5" (1.562 m)   Wt 148 lb (67.1 kg)   BMI 27.51 kg/m    Physical Exam  Constitutional: She is oriented to person, place, and time. She appears well-developed and well-nourished.  HENT:  Head: Normocephalic and atraumatic.  Eyes: EOM are normal. Pupils are equal, round, and reactive to light.  Cardiovascular: Normal rate, regular rhythm and normal heart sounds.  Exam reveals no gallop and no friction rub.   No murmur heard. Pulmonary/Chest: Effort normal and breath sounds normal. She has no wheezes. She has no rales.  Abdominal: Soft. Bowel sounds are normal. She exhibits no distension. There is no tenderness. There is no guarding. No hernia.  Musculoskeletal: Normal range of motion. She exhibits no edema, tenderness or deformity.  Lymphadenopathy:    She has no cervical adenopathy.  Neurological: She is alert and oriented to person, place, and time. Coordination normal.  Skin: Skin is warm and dry. Capillary refill  takes less than 2 seconds. No rash noted.  Psychiatric: She has a normal mood and affect. Her behavior is normal.  Nursing note and vitals reviewed.    Musculoskeletal Exam:  Full range of motion of all joints except left wrist has fusion of left wrist so decreased extension and flexion Grip strength is equal and strong bilaterally Fibromyalgia tender points are all absent  CDAI Exam: CDAI Homunculus Exam:   Joint Counts:  CDAI Tender Joint count: 0 CDAI Swollen Joint count: 0  no synovitis  Investigation: No additional findings. Hospital Outpatient Visit on 06/07/2016  Component Date Value Ref Range  Status  . WBC 06/07/2016 6.5  4.0 - 10.5 K/uL Final  . RBC 06/07/2016 5.18* 3.87 - 5.11 MIL/uL Final  . Hemoglobin 06/07/2016 16.2* 12.0 - 15.0 g/dL Final  . HCT 06/07/2016 46.7* 36.0 - 46.0 % Final  . MCV 06/07/2016 90.2  78.0 - 100.0 fL Final  . MCH 06/07/2016 31.3  26.0 - 34.0 pg Final  . MCHC 06/07/2016 34.7  30.0 - 36.0 g/dL Final  . RDW 06/07/2016 13.7  11.5 - 15.5 % Final  . Platelets 06/07/2016 174  150 - 400 K/uL Final  . Sodium 06/07/2016 137  135 - 145 mmol/L Final  . Potassium 06/07/2016 3.8  3.5 - 5.1 mmol/L Final  . Chloride 06/07/2016 103  101 - 111 mmol/L Final  . CO2 06/07/2016 26  22 - 32 mmol/L Final  . Glucose, Bld 06/07/2016 85  65 - 99 mg/dL Final  . BUN 06/07/2016 25* 6 - 20 mg/dL Final  . Creatinine, Ser 06/07/2016 0.81  0.44 - 1.00 mg/dL Final  . Calcium 06/07/2016 9.6  8.9 - 10.3 mg/dL Final  . Total Protein 06/07/2016 7.2  6.5 - 8.1 g/dL Final  . Albumin 06/07/2016 4.1  3.5 - 5.0 g/dL Final  . AST 06/07/2016 22  15 - 41 U/L Final  . ALT 06/07/2016 23  14 - 54 U/L Final  . Alkaline Phosphatase 06/07/2016 53  38 - 126 U/L Final  . Total Bilirubin 06/07/2016 0.7  0.3 - 1.2 mg/dL Final  . GFR calc non Af Amer 06/07/2016 >60  >60 mL/min Final  . GFR calc Af Amer 06/07/2016 >60  >60 mL/min Final   Comment: (NOTE) The eGFR has been calculated using the CKD EPI equation. This calculation has not been validated in all clinical situations. eGFR's persistently <60 mL/min signify possible Chronic Kidney Disease.   Georgiann Hahn gap 06/07/2016 8  5 - 15 Final  Hospital Outpatient Visit on 04/12/2016  Component Date Value Ref Range Status  . WBC 04/12/2016 7.8  4.0 - 10.5 K/uL Final  . RBC 04/12/2016 4.98  3.87 - 5.11 MIL/uL Final  . Hemoglobin 04/12/2016 15.6* 12.0 - 15.0 g/dL Final  . HCT 04/12/2016 44.5  36.0 - 46.0 % Final  . MCV 04/12/2016 89.4  78.0 - 100.0 fL Final  . MCH 04/12/2016 31.3  26.0 - 34.0 pg Final  . MCHC 04/12/2016 35.1  30.0 - 36.0 g/dL Final  .  RDW 04/12/2016 14.4  11.5 - 15.5 % Final  . Platelets 04/12/2016 193  150 - 400 K/uL Final  . Neutrophils Relative % 04/12/2016 54  % Final  . Neutro Abs 04/12/2016 4.2  1.7 - 7.7 K/uL Final  . Lymphocytes Relative 04/12/2016 33  % Final  . Lymphs Abs 04/12/2016 2.6  0.7 - 4.0 K/uL Final  . Monocytes Relative 04/12/2016 8  % Final  . Monocytes Absolute  04/12/2016 0.6  0.1 - 1.0 K/uL Final  . Eosinophils Relative 04/12/2016 5  % Final  . Eosinophils Absolute 04/12/2016 0.4  0.0 - 0.7 K/uL Final  . Basophils Relative 04/12/2016 0  % Final  . Basophils Absolute 04/12/2016 0.0  0.0 - 0.1 K/uL Final  . Sodium 04/12/2016 134* 135 - 145 mmol/L Final  . Potassium 04/12/2016 3.9  3.5 - 5.1 mmol/L Final  . Chloride 04/12/2016 100* 101 - 111 mmol/L Final  . CO2 04/12/2016 25  22 - 32 mmol/L Final  . Glucose, Bld 04/12/2016 91  65 - 99 mg/dL Final  . BUN 04/12/2016 21* 6 - 20 mg/dL Final  . Creatinine, Ser 04/12/2016 0.67  0.44 - 1.00 mg/dL Final  . Calcium 04/12/2016 9.7  8.9 - 10.3 mg/dL Final  . Total Protein 04/12/2016 7.1  6.5 - 8.1 g/dL Final  . Albumin 04/12/2016 4.0  3.5 - 5.0 g/dL Final  . AST 04/12/2016 18  15 - 41 U/L Final  . ALT 04/12/2016 23  14 - 54 U/L Final  . Alkaline Phosphatase 04/12/2016 50  38 - 126 U/L Final  . Total Bilirubin 04/12/2016 0.6  0.3 - 1.2 mg/dL Final  . GFR calc non Af Amer 04/12/2016 >60  >60 mL/min Final  . GFR calc Af Amer 04/12/2016 >60  >60 mL/min Final   Comment: (NOTE) The eGFR has been calculated using the CKD EPI equation. This calculation has not been validated in all clinical situations. eGFR's persistently <60 mL/min signify possible Chronic Kidney Disease.   Georgiann Hahn gap 04/12/2016 9  5 - 15 Final  Hospital Outpatient Visit on 02/16/2016  Component Date Value Ref Range Status  . WBC 02/16/2016 6.8  4.0 - 10.5 K/uL Final  . RBC 02/16/2016 5.11  3.87 - 5.11 MIL/uL Final  . Hemoglobin 02/16/2016 15.6* 12.0 - 15.0 g/dL Final  . HCT 02/16/2016  45.5  36.0 - 46.0 % Final  . MCV 02/16/2016 89.0  78.0 - 100.0 fL Final  . MCH 02/16/2016 30.5  26.0 - 34.0 pg Final  . MCHC 02/16/2016 34.3  30.0 - 36.0 g/dL Final  . RDW 02/16/2016 13.8  11.5 - 15.5 % Final  . Platelets 02/16/2016 197  150 - 400 K/uL Final  . Neutrophils Relative % 02/16/2016 49  % Final  . Neutro Abs 02/16/2016 3.4  1.7 - 7.7 K/uL Final  . Lymphocytes Relative 02/16/2016 37  % Final  . Lymphs Abs 02/16/2016 2.5  0.7 - 4.0 K/uL Final  . Monocytes Relative 02/16/2016 7  % Final  . Monocytes Absolute 02/16/2016 0.4  0.1 - 1.0 K/uL Final  . Eosinophils Relative 02/16/2016 6  % Final  . Eosinophils Absolute 02/16/2016 0.4  0.0 - 0.7 K/uL Final  . Basophils Relative 02/16/2016 1  % Final  . Basophils Absolute 02/16/2016 0.0  0.0 - 0.1 K/uL Final  . Sodium 02/16/2016 134* 135 - 145 mmol/L Final  . Potassium 02/16/2016 4.1  3.5 - 5.1 mmol/L Final  . Chloride 02/16/2016 101  101 - 111 mmol/L Final  . CO2 02/16/2016 23  22 - 32 mmol/L Final  . Glucose, Bld 02/16/2016 81  65 - 99 mg/dL Final  . BUN 02/16/2016 28* 6 - 20 mg/dL Final  . Creatinine, Ser 02/16/2016 0.67  0.44 - 1.00 mg/dL Final  . Calcium 02/16/2016 9.6  8.9 - 10.3 mg/dL Final  . Total Protein 02/16/2016 7.3  6.5 - 8.1 g/dL Final  . Albumin 02/16/2016 4.1  3.5 - 5.0 g/dL Final  . AST 02/16/2016 25  15 - 41 U/L Final  . ALT 02/16/2016 27  14 - 54 U/L Final  . Alkaline Phosphatase 02/16/2016 55  38 - 126 U/L Final  . Total Bilirubin 02/16/2016 0.6  0.3 - 1.2 mg/dL Final  . GFR calc non Af Amer 02/16/2016 >60  >60 mL/min Final  . GFR calc Af Amer 02/16/2016 >60  >60 mL/min Final   Comment: (NOTE) The eGFR has been calculated using the CKD EPI equation. This calculation has not been validated in all clinical situations. eGFR's persistently <60 mL/min signify possible Chronic Kidney Disease.   . Anion gap 02/16/2016 10  5 - 15 Final     Imaging: No results found.  Speciality Comments: No specialty  comments available.    Procedures:  No procedures performed Allergies: Clindamycin/lincomycin; Codeine; Leflunomide; Levofloxacin; Penicillins; Statins; and Tetracyclines & related   Assessment / Plan:     Visit Diagnoses: Rheumatoid arthritis, seronegative, multiple sites ([place])  High risk medications (not anticoagulants) long-term use - 07-24-16:  orencia IV q 4 wks  Primary osteoarthritis of both knees  Bilateral hand pain  Uveitis - 07/24/2016: No flare  Iritis - 07/24/2016: No flare   Plan:  #1: Rheumatoid arthritis, seronegative. Patient is on Orencia IV every 4 weeks. Gets blood drawn every 8 weeks at the time of Orencia via [facility]  #2: High-risk prescription Orencia IV every 4 weeks  #3: OA of bilateral knees. Patient feels that her knees are bone-on-bone. She is in a lot of pain but is holding off on getting knee replacements. She is a [employer] scientist and she is heard good things and bad things about knee replacements. Specifically, she's had some friends that have done really well with the replacements were as some other friends who have more knee pain after the surgery. Therefore she is anxious about getting her knees replaced.  #4: Bilateral knee pain off and on. A lot of stiffness with short amount of sitting  #5: Bilateral hand pain  #6: Weight loss. Patient continues to make efforts at weight loss.  #7: Return to clinic in 5 months  #8: Patient is wanting information on our opinion on CBD oil. Her sister gave her a small amount to try and she is unsure whether or not it helped her or not.  Orders: No orders of the defined types were placed in this encounter.  No orders of the defined types were placed in this encounter.   Face-to-face time spent with patient was 30 minutes. 50% of time was spent in counseling and coordination of care.  Follow-Up Instructions: Return in about 5 months (around 12/24/2016).   Eliezer Lofts, PA-C  Note - This  record has been created using [employer].  Chart creation errors have been sought, but may not always  have been located. Such creation errors do not reflect on  the standard of medical care.

## 2016-08-01 ENCOUNTER — Telehealth: Payer: Self-pay | Admitting: Rheumatology

## 2016-08-01 ENCOUNTER — Other Ambulatory Visit: Payer: Self-pay | Admitting: Radiology

## 2016-08-01 DIAGNOSIS — M0609 Rheumatoid arthritis without rheumatoid factor, multiple sites: Secondary | ICD-10-CM

## 2016-08-01 MED ORDER — ACETAMINOPHEN 325 MG PO TABS: 650.0000 mg | ORAL_TABLET | ORAL | Status: AC

## 2016-08-01 MED ORDER — DIPHENHYDRAMINE HCL 25 MG PO CAPS: 25.0000 mg | ORAL_CAPSULE | ORAL | Status: AC

## 2016-08-01 MED ORDER — SODIUM CHLORIDE 0.9 % IV SOLN: 750.0000 mg | INTRAVENOUS | Status: AC

## 2016-08-01 NOTE — Telephone Encounter (Signed)
Needs orders for Orencia put in. Patient coming to their hospital tomorrow.

## 2016-08-01 NOTE — Progress Notes (Unsigned)
Infusion orders updated today including CBC CMP Tylenol and Benadryl appointments are up to date and follow up appointment is scheduled TB gold is due next month

## 2016-08-02 ENCOUNTER — Encounter (HOSPITAL_COMMUNITY)
Admission: RE | Admit: 2016-08-02 | Discharge: 2016-08-02 | Disposition: A | Discharge status: Home / Self Care | Payer: Medicare Other | Source: Ambulatory Visit | Attending: Rheumatology | Admitting: Rheumatology

## 2016-08-02 DIAGNOSIS — Z79899 Other long term (current) drug therapy: Secondary | ICD-10-CM | POA: Insufficient documentation

## 2016-08-02 DIAGNOSIS — M0609 Rheumatoid arthritis without rheumatoid factor, multiple sites: Secondary | ICD-10-CM | POA: Diagnosis not present

## 2016-08-02 LAB — COMPREHENSIVE METABOLIC PANEL
ALBUMIN: 4.1 g/dL (ref 3.5–5.0)
ALK PHOS: 50 U/L (ref 38–126)
ALT: 18 U/L (ref 14–54)
ANION GAP: 13 (ref 5–15)
AST: 22 U/L (ref 15–41)
BILIRUBIN TOTAL: 0.9 mg/dL (ref 0.3–1.2)
BUN: 23 mg/dL — ABNORMAL HIGH (ref 6–20)
CALCIUM: 9.5 mg/dL (ref 8.9–10.3)
CO2: 22 mmol/L (ref 22–32)
CREATININE: 0.74 mg/dL (ref 0.44–1.00)
Chloride: 101 mmol/L (ref 101–111)
GFR calc non Af Amer: >60 mL/min (ref >60)
GLUCOSE: 94 mg/dL (ref 65–99)
Potassium: 3.8 mmol/L (ref 3.5–5.1)
Sodium: 136 mmol/L (ref 135–145)
TOTAL PROTEIN: 7.3 g/dL (ref 6.5–8.1)

## 2016-08-02 MED ORDER — SODIUM CHLORIDE 0.9 % IV SOLN
750.0000 mg | INTRAVENOUS | Status: DC
  Administered 2016-08-02: 750 mg via INTRAVENOUS
  Filled 2016-08-02: qty 30

## 2016-08-02 MED ORDER — SODIUM CHLORIDE 0.9 % IV SOLN
INTRAVENOUS | Status: DC
  Administered 2016-08-02: 1000 mL via INTRAVENOUS

## 2016-08-02 NOTE — Progress Notes (Signed)
Spoke with office nurse in Dr. Fatima Sanger office to notify we were unable to release the Orencia orders due to being placed on the PAT side.  Orders were re-entered for release and a note was placed under the medication order per active electronic order.  Office nurse verbalized understanding.  The new order was sent as verbal order for the physicians signature.

## 2016-08-02 NOTE — Progress Notes (Signed)
stable °

## 2016-08-29 ENCOUNTER — Other Ambulatory Visit: Payer: Self-pay | Admitting: *Deleted

## 2016-08-29 ENCOUNTER — Telehealth: Payer: Self-pay | Admitting: Rheumatology

## 2016-08-29 DIAGNOSIS — M0579 Rheumatoid arthritis with rheumatoid factor of multiple sites without organ or systems involvement: Secondary | ICD-10-CM

## 2016-08-29 NOTE — Telephone Encounter (Signed)
Patient coming tomorrow, needs Orencia orders sent over.

## 2016-08-29 NOTE — Telephone Encounter (Signed)
Orders placed.

## 2016-08-30 ENCOUNTER — Encounter (HOSPITAL_COMMUNITY): Payer: Self-pay

## 2016-08-30 ENCOUNTER — Encounter (HOSPITAL_COMMUNITY)
Admission: RE | Admit: 2016-08-30 | Discharge: 2016-08-30 | Disposition: A | Discharge status: Home / Self Care | Payer: Medicare Other | Source: Ambulatory Visit | Attending: Rheumatology | Admitting: Rheumatology

## 2016-08-30 DIAGNOSIS — M0579 Rheumatoid arthritis with rheumatoid factor of multiple sites without organ or systems involvement: Secondary | ICD-10-CM | POA: Insufficient documentation

## 2016-08-30 LAB — CBC
HEMATOCRIT: 42.7 % (ref 36.0–46.0)
Hemoglobin: 14.9 g/dL (ref 12.0–15.0)
MCH: 31.2 pg (ref 26.0–34.0)
MCHC: 34.9 g/dL (ref 30.0–36.0)
MCV: 89.3 fL (ref 78.0–100.0)
Platelets: 175 10*3/uL (ref 150–400)
RBC: 4.78 MIL/uL (ref 3.87–5.11)
RDW: 13.7 % (ref 11.5–15.5)
WBC: 7.3 10*3/uL (ref 4.0–10.5)

## 2016-08-30 LAB — COMPREHENSIVE METABOLIC PANEL
ALT: 19 U/L (ref 14–54)
AST: 23 U/L (ref 15–41)
Albumin: 3.9 g/dL (ref 3.5–5.0)
Alkaline Phosphatase: 45 U/L (ref 38–126)
Anion gap: 8 (ref 5–15)
BILIRUBIN TOTAL: 0.9 mg/dL (ref 0.3–1.2)
BUN: 22 mg/dL — AB (ref 6–20)
CHLORIDE: 103 mmol/L (ref 101–111)
CO2: 24 mmol/L (ref 22–32)
Calcium: 9.4 mg/dL (ref 8.9–10.3)
Creatinine, Ser: 0.79 mg/dL (ref 0.44–1.00)
GFR calc Af Amer: >60 mL/min (ref >60)
GFR calc non Af Amer: >60 mL/min (ref >60)
GLUCOSE: 88 mg/dL (ref 65–99)
POTASSIUM: 4.4 mmol/L (ref 3.5–5.1)
Sodium: 135 mmol/L (ref 135–145)
Total Protein: 6.7 g/dL (ref 6.5–8.1)

## 2016-08-30 MED ORDER — ACETAMINOPHEN 325 MG PO TABS: 650.0000 mg | ORAL_TABLET | ORAL | Status: DC

## 2016-08-30 MED ORDER — SODIUM CHLORIDE 0.9 % IV SOLN
INTRAVENOUS | Status: DC
  Administered 2016-08-30: 250 mL via INTRAVENOUS

## 2016-08-30 MED ORDER — SODIUM CHLORIDE 0.9 % IV SOLN
750.0000 mg | INTRAVENOUS | Status: DC
  Administered 2016-08-30: 750 mg via INTRAVENOUS
  Filled 2016-08-30: qty 30

## 2016-08-30 MED ORDER — DIPHENHYDRAMINE HCL 25 MG PO CAPS: 25.0000 mg | ORAL_CAPSULE | ORAL | Status: DC

## 2016-08-30 NOTE — Discharge Instructions (Signed)
Abatacept solution for injection (subcutaneous or intravenous use)  What is this medicine?  ABATACEPT (a ba TA sept) is used to treat moderate to severe active rheumatoid arthritis or psoriatic arthritis in adults. This medicine is also used to treat juvenile idiopathic arthritis.  This medicine may be used for other purposes; ask your health care provider or pharmacist if you have questions.  COMMON BRAND NAME(S): Orencia  What should I tell my health care provider before I take this medicine?  They need to know if you have any of these conditions:  -are taking other medicines to treat rheumatoid arthritis  -COPD  -diabetes  -infection or history of infections  -recently received or scheduled to receive a vaccine  -scheduled to have surgery  -tuberculosis, a positive skin test for tuberculosis or have recently been in close contact with someone who has tuberculosis  -viral hepatitis  -an unusual or allergic reaction to abatacept, other medicines, foods, dyes, or preservatives  -pregnant or trying to get pregnant  -breast-feeding  How should I use this medicine?  This medicine is for infusion into a vein or for injection under the skin. Infusions are given by a health care professional in a hospital or clinic setting. If you are to give your own medicine at home, you will be taught how to prepare and give this medicine under the skin. Use exactly as directed. Take your medicine at regular intervals. Do not take your medicine more often than directed.  It is important that you put your used needles and syringes in a special sharps container. Do not put them in a trash can. If you do not have a sharps container, call your pharmacist or healthcare provider to get one.  Talk to your pediatrician regarding the use of this medicine in children. While infusions in a clinic may be prescribed for children as young as 2 years for selected conditions, precautions do apply.  Overdosage: If you think you have taken too much of  this medicine contact a poison control center or emergency room at once.  NOTE: This medicine is only for you. Do not share this medicine with others.  What if I miss a dose?  This medicine is used once a week if given by injection under the skin. If you miss a dose, take it as soon as you can. If it is almost time for your next dose, take only that dose. Do not take double or extra doses.  If you are to be given an infusion, it is important not to miss your dose. Doses are usually every 4 weeks. Call your doctor or health care professional if you are unable to keep an appointment.  What may interact with this medicine?  Do not take this medicine with any of the following medications:  -adalimumab  -anakinra  -certolizumab  -etanercept  -golimumab  -infliximab  -live virus vaccines  -rituximab  -tocilizumab  This medicine may also interact with the following medications:  -vaccines  This list may not describe all possible interactions. Give your health care provider a list of all the medicines, herbs, non-prescription drugs, or dietary supplements you use. Also tell them if you smoke, drink alcohol, or use illegal drugs. Some items may interact with your medicine.  What should I watch for while using this medicine?  Visit your doctor for regular check ups while you are taking this medicine. Tell your doctor or healthcare professional if your symptoms do not start to get better or if they   get worse.  Call your doctor or health care professional if you get a cold or other infection while receiving this medicine. Do not treat yourself. This medicine may decrease your body's ability to fight infection. Try to avoid being around people who are sick.  What side effects may I notice from receiving this medicine?  Side effects that you should report to your doctor or health care professional as soon as possible:  -allergic reactions like skin rash, itching or hives, swelling of the face, lips, or tongue  -breathing  problems  -chest pain  -signs of infection - fever or chills, cough, unusual tiredness, pain or trouble passing urine, or warm, red or painful skin  Side effects that usually do not require medical attention (report to your doctor or health care professional if they continue or are bothersome):  -dizziness  -headache  -nausea, vomiting  -sore throat  -stomach upset  This list may not describe all possible side effects. Call your doctor for medical advice about side effects. You may report side effects to FDA at 1-800-FDA-1088.  Where should I keep my medicine?  Infusions will be given in a hospital or clinic and will not be stored at home.  Storage for syringes given under the skin and stored at home:  Keep out of the reach of children. Store in a refrigerator between 2 and 8 degrees C (36 and 46 degrees F). Keep this medicine in the original container. Protect from light. Do not freeze. Throw away any unused medicine after the expiration date.  NOTE: This sheet is a summary. It may not cover all possible information. If you have questions about this medicine, talk to your doctor, pharmacist, or health care provider.   2018 Elsevier/Gold Standard (2015-08-11 10:07:35)

## 2016-08-30 NOTE — Progress Notes (Signed)
WNL

## 2016-09-03 LAB — QUANTIFERON IN TUBE
QFT TB AG MINUS NIL VALUE: 0 IU/mL
QUANTIFERON NIL VALUE: 0.02 [IU]/mL
QUANTIFERON TB AG VALUE: 0.02 [IU]/mL
QUANTIFERON TB GOLD: NEGATIVE

## 2016-09-03 LAB — QUANTIFERON TB GOLD ASSAY (BLOOD)

## 2016-09-27 ENCOUNTER — Encounter (HOSPITAL_COMMUNITY)
Admission: RE | Admit: 2016-09-27 | Discharge: 2016-09-27 | Disposition: A | Discharge status: Home / Self Care | Payer: Medicare Other | Source: Ambulatory Visit | Attending: Rheumatology | Admitting: Rheumatology

## 2016-09-27 ENCOUNTER — Encounter (HOSPITAL_COMMUNITY): Payer: Self-pay

## 2016-09-27 ENCOUNTER — Other Ambulatory Visit: Payer: Self-pay | Admitting: Radiology

## 2016-09-27 ENCOUNTER — Telehealth: Payer: Self-pay | Admitting: Rheumatology

## 2016-09-27 DIAGNOSIS — M0609 Rheumatoid arthritis without rheumatoid factor, multiple sites: Secondary | ICD-10-CM

## 2016-09-27 MED ORDER — ACETAMINOPHEN 325 MG PO TABS: 650.0000 mg | ORAL_TABLET | ORAL | Status: DC

## 2016-09-27 MED ORDER — DIPHENHYDRAMINE HCL 25 MG PO CAPS: 25.0000 mg | ORAL_CAPSULE | ORAL | Status: DC

## 2016-09-27 MED ORDER — SODIUM CHLORIDE 0.9 % IV SOLN
750.0000 mg | INTRAVENOUS | Status: DC
  Administered 2016-09-27: 750 mg via INTRAVENOUS
  Filled 2016-09-27: qty 30

## 2016-09-27 NOTE — Telephone Encounter (Signed)
Need orders for Orencia. Patient there now. Need lab orders also. Fax#[phone]

## 2016-09-27 NOTE — Telephone Encounter (Signed)
Thanks, orders placed

## 2016-09-27 NOTE — Progress Notes (Signed)
Infusion orders updated today including CBC CMP Tylenol and Benadryl appointments are up to date and a follow up appointment is scheduled/ TB gold is due on July 2019 

## 2016-09-27 NOTE — Telephone Encounter (Signed)
FYI, the next 4 infusion orders were placed today, along with the next two lab orders, so Kiara Miller should not need new orders until after December.

## 2016-10-24 ENCOUNTER — Other Ambulatory Visit: Payer: Self-pay | Admitting: *Deleted

## 2016-10-24 ENCOUNTER — Telehealth: Payer: Self-pay | Admitting: Rheumatology

## 2016-10-24 DIAGNOSIS — M0579 Rheumatoid arthritis with rheumatoid factor of multiple sites without organ or systems involvement: Secondary | ICD-10-CM

## 2016-10-24 NOTE — Telephone Encounter (Signed)
Infusion Orders placed.

## 2016-10-24 NOTE — Telephone Encounter (Addendum)
Clydie Braun from [facility] hospital calling requesting orders for Orencia for patients appt tomorrow 9/20.

## 2016-10-25 ENCOUNTER — Encounter (HOSPITAL_COMMUNITY)
Admission: RE | Admit: 2016-10-25 | Discharge: 2016-10-25 | Disposition: A | Discharge status: Home / Self Care | Payer: Medicare Other | Source: Ambulatory Visit | Attending: Rheumatology | Admitting: Rheumatology

## 2016-10-25 ENCOUNTER — Encounter (HOSPITAL_COMMUNITY): Payer: Self-pay

## 2016-10-25 DIAGNOSIS — M0579 Rheumatoid arthritis with rheumatoid factor of multiple sites without organ or systems involvement: Secondary | ICD-10-CM | POA: Diagnosis present

## 2016-10-25 LAB — COMPREHENSIVE METABOLIC PANEL
ALBUMIN: 4.1 g/dL (ref 3.5–5.0)
ALT: 22 U/L (ref 14–54)
AST: 23 U/L (ref 15–41)
Alkaline Phosphatase: 52 U/L (ref 38–126)
Anion gap: 12 (ref 5–15)
BILIRUBIN TOTAL: 0.7 mg/dL (ref 0.3–1.2)
BUN: 25 mg/dL — ABNORMAL HIGH (ref 6–20)
CO2: 25 mmol/L (ref 22–32)
Calcium: 9.7 mg/dL (ref 8.9–10.3)
Chloride: 99 mmol/L — ABNORMAL LOW (ref 101–111)
Creatinine, Ser: 0.67 mg/dL (ref 0.44–1.00)
Glucose, Bld: 81 mg/dL (ref 65–99)
POTASSIUM: 3.9 mmol/L (ref 3.5–5.1)
Sodium: 136 mmol/L (ref 135–145)
TOTAL PROTEIN: 7.5 g/dL (ref 6.5–8.1)

## 2016-10-25 LAB — CBC
HEMATOCRIT: 46.5 % — AB (ref 36.0–46.0)
Hemoglobin: 16.1 g/dL — ABNORMAL HIGH (ref 12.0–15.0)
MCH: 31.2 pg (ref 26.0–34.0)
MCHC: 34.6 g/dL (ref 30.0–36.0)
MCV: 90.1 fL (ref 78.0–100.0)
Platelets: 188 10*3/uL (ref 150–400)
RBC: 5.16 MIL/uL — ABNORMAL HIGH (ref 3.87–5.11)
RDW: 13.8 % (ref 11.5–15.5)
WBC: 7.4 10*3/uL (ref 4.0–10.5)

## 2016-10-25 MED ORDER — SODIUM CHLORIDE 0.9% FLUSH
INTRAVENOUS | Status: AC
  Filled 2016-10-25: qty 10

## 2016-10-25 MED ORDER — ABATACEPT 250 MG IV SOLR
750.0000 mg | INTRAVENOUS | Status: DC
  Administered 2016-10-25: 750 mg via INTRAVENOUS
  Filled 2016-10-25: qty 30

## 2016-10-25 NOTE — Progress Notes (Signed)
Labs are stable.

## 2016-11-15 ENCOUNTER — Telehealth: Payer: Self-pay | Admitting: Rheumatology

## 2016-11-15 NOTE — Telephone Encounter (Signed)
E can dc Orencia per her wishes. If she has a flare she may have to restart.

## 2016-11-15 NOTE — Telephone Encounter (Signed)
Patient left a message stating she had questions about continuing Orencia. Please call to advise.

## 2016-11-15 NOTE — Telephone Encounter (Signed)
Pt states she has been going for infusions and she wants to know if she can try to go without infusions, she is doing well with her joints, has been on Orencia for 2 years and wants to know what you think about her trying to d/c meds for a while  She states she has large bruises after the infusions from her IV access.   Please advise

## 2016-11-16 NOTE — Telephone Encounter (Signed)
Left message on machine for patient to call the office.

## 2016-11-19 NOTE — Telephone Encounter (Signed)
Patient advised that she can dc Orencia per her wishes, but if she flares she may have to restart. Patient is okay with this and states she just wants a break from the infusions for a while. Patient states when she comes in for an appointment in December, she will re-address this with Dr. Corliss Skains.

## 2016-11-22 ENCOUNTER — Encounter (HOSPITAL_COMMUNITY): Admission: RE | Admit: 2016-11-22 | Payer: Medicare Other | Source: Ambulatory Visit

## 2016-12-25 ENCOUNTER — Ambulatory Visit: Payer: Medicare Other | Admitting: Rheumatology

## 2016-12-28 NOTE — Progress Notes (Signed)
Office Visit Note  Patient: Kiara Miller             Date of Birth: 06-13-1950           MRN: [id number]             PCP: Joette Catching, MD Referring: Joette Catching, MD Visit Date: 01/09/2017 Occupation: @GUAROCC @    Subjective:  Hand pain and wrist pain   History of Present Illness: Kiara Miller is a 66 y.o. female with history of rheumatoid arthritis and osteoarthritis.  Patient states continues to have right wrist pain and bilateral thumb joints.  She also has bilateral knee pain.  She states she is not ready for a knee replacement.  Her left ankle joint continues to cause discomfort.  She states her last dose of Orencia IV was in September 2018, which she reports she only received about half of the dose due to a malfunction in the tubing.  She has now been off of Orencia for 3 months.  She does not like going to the infusion center and does not like the possible side effects.  She would like to continue seeing how she does without Orencia.  She reports that 4 days before thanksgiving, she experienced severe pain in her left 2nd and 3rd toe that lasted for 5-6 days.  She states she was unable to bear weight or perform any range of motion.     Activities of Daily Living:  Patient reports morning stiffness for 1 hours.   Patient Reports nocturnal pain.  Difficulty dressing/grooming: Denies Difficulty climbing stairs: Reports Difficulty getting out of chair: Reports Difficulty using hands for taps, buttons, cutlery, and/or writing: Reports   Review of Systems  Constitutional: Positive for fatigue. Negative for weakness.  HENT: Negative for mouth sores, mouth dryness and nose dryness.   Eyes: Negative for redness and dryness.  Respiratory: Negative for cough, hemoptysis, shortness of breath, wheezing and difficulty breathing.   Cardiovascular: Negative for chest pain, palpitations, hypertension, irregular heartbeat and swelling in legs/feet.  Gastrointestinal: Negative for  blood in stool, constipation and diarrhea.  Endocrine: Negative for increased urination.  Genitourinary: Negative for painful urination.  Musculoskeletal: Negative for arthralgias, joint pain, joint swelling, myalgias, muscle weakness, morning stiffness, muscle tenderness and myalgias.  Skin: Negative for color change, pallor, rash, hair loss, nodules/bumps, redness, skin tightness, ulcers and sensitivity to sunlight.  Neurological: Negative for dizziness, numbness and headaches.  Hematological: Negative for swollen glands.  Psychiatric/Behavioral: Negative.  Negative for depressed mood and sleep disturbance. The patient is not nervous/anxious.     PMFS History:  Patient Active Problem List   Diagnosis Date Noted  . 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 Uveitis 10/10/2015  . Diverticulitis 10/10/2015  . Dyspnea 12/05/2010  . Palpitations 12/05/2010    Past Medical History:  Diagnosis Date  . Diverticulitis April 2017  . Hyperlipidemia   . Hypertension   . Joint pain   . Polyarthralgia   . Right knee pain     Family History  Problem Relation Age of Onset  . Hypertension Father   . Lung disease Brother    Past Surgical History:  Procedure Laterality Date  . ABDOMINAL HYSTERECTOMY    . CARPAL TUNNEL RELEASE    . KNEE ARTHROSCOPY    . MANDIBLE SURGERY    . OSTEOTOMY PELVIS BILATERAL    . TONSILLECTOMY    . TUBAL LIGATION    . WRIST FUSION  Social History   Social History Narrative  . Not on file     Objective: Vital Signs: BP 114/63 (BP Location: Left Arm, Patient Position: Sitting, Cuff Size: Normal)   Pulse 77   Resp 14   Ht 5' 1.5" (1.562 m)   Wt 156 lb (70.8 kg)   BMI 29.00 kg/m    Physical Exam  Constitutional: She is oriented to person, place, and time. She appears well-developed and well-nourished.  HENT:  Head: Normocephalic and atraumatic.  Eyes: Conjunctivae and EOM are normal.  Neck: Normal range  of motion.  Cardiovascular: Normal rate, regular rhythm, normal heart sounds and intact distal pulses.  Pulmonary/Chest: Effort normal and breath sounds normal.  Abdominal: Soft. Bowel sounds are normal.  Lymphadenopathy:    She has no cervical adenopathy.  Neurological: She is alert and oriented to person, place, and time.  Skin: Skin is warm and dry. Capillary refill takes less than 2 seconds.  Psychiatric: She has a normal mood and affect. Her behavior is normal.  Nursing note and vitals reviewed.    Musculoskeletal Exam: C-spine, thoracic, and lumbar good ROM.  Shoulder joints, elbow joints good ROM.  Left wrist fused, extremely limited flexion.  Right wrist good ROM.  MCP thickening of bilateral 2nd and 3rd.  No synovitis.  Mild DIP thickening bilaterally consistent with osteoarthritis.  CMC pain and thickening bilaterally.  Hip joints, knee joints, and ankle joints good ROM.  Right knee no effusion, slightly warmer than left.  Some discomfort with flexion and some crepitus bilaterally.  No tenderness over greater trochanteric bursa.    CDAI Exam: CDAI Homunculus Exam:   Tenderness:  RUE: wrist Right hand: 2nd MCP and 3rd MCP Left hand: 2nd MCP and 3rd MCP  Joint Counts:  CDAI Tender Joint count: 5 CDAI Swollen Joint count: 0  Global Assessments:  Patient Global Assessment: 3 Provider Global Assessment: 3  CDAI Calculated Score: 11    Investigation: No additional findings.TB Gold: 08/30/2016 Negative CBC Latest Ref Rng & Units 10/25/2016 08/30/2016 06/07/2016  WBC 4.0 - 10.5 K/uL 7.4 7.3 6.5  Hemoglobin 12.0 - 15.0 g/dL 16.1(H) 14.9 16.2(H)  Hematocrit 36.0 - 46.0 % 46.5(H) 42.7 46.7(H)  Platelets 150 - 400 K/uL 188 175 174   CMP Latest Ref Rng & Units 10/25/2016 08/30/2016 08/02/2016  Glucose 65 - 99 mg/dL 81 88 94  BUN 6 - 20 mg/dL 17(O) 16(W) 73(X)  Creatinine 0.44 - 1.00 mg/dL 1.06 2.69 4.85  Sodium 135 - 145 mmol/L 136 135 136  Potassium 3.5 - 5.1 mmol/L 3.9 4.4 3.8    Chloride 101 - 111 mmol/L 99(L) 103 101  CO2 22 - 32 mmol/L 25 24 22   Calcium 8.9 - 10.3 mg/dL 9.7 9.4 9.5  Total Protein 6.5 - 8.1 g/dL 7.5 6.7 7.3  Total Bilirubin 0.3 - 1.2 mg/dL 0.7 0.9 0.9  Alkaline Phos 38 - 126 U/L 52 45 50  AST 15 - 41 U/L 23 23 22   ALT 14 - 54 U/L 22 19 18     Imaging: No results found.  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  Patient has no synovitis on exam.  She had a recent flare  in her 2nd and 3rd MTP joints a few weeks ago, which resolved on its own.  Patient discontinued use of Orencia IV about 3 months ago.  She would like to continue not taking Orencia at this time.  She was instructed to call us if she has a flare.      High risk medication use - She discontinued Orencia IV.  Last infusion was in September 2018.  Last routine labs were 10/25/16.    Plantar fasciitis: Patient wears proper fitting shoes.  Reminded patient of plantar fasciitis exercises.    Primary osteoarthritis of both knees: Continues to have significant discomfort, stiffness, and night pain.  Her symptoms are worse in her right knee.  She would not like to move forward with an orthopedic consultation at this time.      History of obesity: Patient is on a low-carb diet and has lost 60 lbs in the past year.    Other medical conditions are listed as follows:   History of hypertension  History of hypothyroidism  History of diverticulitis  S/P right knee arthroscopy - meniscal repair: Right knee continues to hurt.  No mechanical symptoms.    History of carpal tunnel surgery of right wrist: Doing well      Orders: No orders of the defined types were placed in this encounter.  No orders of the defined types were placed in this encounter.   Face-to-face time spent with patient was 30 minutes. Greater than 50% of time was spent in counseling and coordination of care.  Follow-Up Instructions: Return in about 5 months (around 06/09/2017) for Rheumatoid arthritis.   Pollyann Savoy, MD  Note - This record has been created using Animal nutritionist.  Chart creation errors have been sought, but may not always  have been located. Such creation errors do not reflect on  the standard of medical care.

## 2017-01-09 ENCOUNTER — Ambulatory Visit (INDEPENDENT_AMBULATORY_CARE_PROVIDER_SITE_OTHER): Payer: Medicare Other | Admitting: Rheumatology

## 2017-01-09 ENCOUNTER — Encounter: Payer: Self-pay | Admitting: Rheumatology

## 2017-01-09 VITALS — BP 114/63 | HR 77 | Resp 14 | Ht 61.5 in | Wt 156.0 lb

## 2017-01-09 DIAGNOSIS — Z8719 Personal history of other diseases of the digestive system: Secondary | ICD-10-CM

## 2017-01-09 DIAGNOSIS — Z8639 Personal history of other endocrine, nutritional and metabolic disease: Secondary | ICD-10-CM

## 2017-01-09 DIAGNOSIS — Z9889 Other specified postprocedural states: Secondary | ICD-10-CM | POA: Diagnosis not present

## 2017-01-09 DIAGNOSIS — M0609 Rheumatoid arthritis without rheumatoid factor, multiple sites: Secondary | ICD-10-CM

## 2017-01-09 DIAGNOSIS — M722 Plantar fascial fibromatosis: Secondary | ICD-10-CM | POA: Diagnosis not present

## 2017-01-09 DIAGNOSIS — Z79899 Other long term (current) drug therapy: Secondary | ICD-10-CM | POA: Diagnosis not present

## 2017-01-09 DIAGNOSIS — M170 Bilateral primary osteoarthritis of knee: Secondary | ICD-10-CM

## 2017-01-09 DIAGNOSIS — Z8679 Personal history of other diseases of the circulatory system: Secondary | ICD-10-CM

## 2017-05-01 ENCOUNTER — Other Ambulatory Visit (HOSPITAL_COMMUNITY): Payer: Self-pay | Admitting: Family Medicine

## 2017-05-01 ENCOUNTER — Ambulatory Visit (HOSPITAL_COMMUNITY)
Admission: RE | Admit: 2017-05-01 | Discharge: 2017-05-01 | Disposition: A | Discharge status: Home / Self Care | Payer: Medicare Other | Source: Ambulatory Visit | Attending: Family Medicine | Admitting: Family Medicine

## 2017-05-01 DIAGNOSIS — I700 Atherosclerosis of aorta: Secondary | ICD-10-CM | POA: Diagnosis not present

## 2017-05-01 DIAGNOSIS — M47812 Spondylosis without myelopathy or radiculopathy, cervical region: Secondary | ICD-10-CM | POA: Diagnosis not present

## 2017-05-01 DIAGNOSIS — Z8739 Personal history of other diseases of the musculoskeletal system and connective tissue: Secondary | ICD-10-CM | POA: Insufficient documentation

## 2017-05-01 DIAGNOSIS — F17200 Nicotine dependence, unspecified, uncomplicated: Secondary | ICD-10-CM | POA: Diagnosis not present

## 2017-05-01 DIAGNOSIS — R222 Localized swelling, mass and lump, trunk: Secondary | ICD-10-CM

## 2017-05-01 DIAGNOSIS — M545 Low back pain: Secondary | ICD-10-CM | POA: Diagnosis not present

## 2017-05-01 DIAGNOSIS — M47896 Other spondylosis, lumbar region: Secondary | ICD-10-CM | POA: Diagnosis not present

## 2017-05-02 ENCOUNTER — Other Ambulatory Visit: Payer: Self-pay | Admitting: Family Medicine

## 2017-05-02 DIAGNOSIS — Z1231 Encounter for screening mammogram for malignant neoplasm of breast: Secondary | ICD-10-CM

## 2017-05-03 ENCOUNTER — Telehealth: Payer: Self-pay | Admitting: Cardiology

## 2017-05-03 ENCOUNTER — Ambulatory Visit (INDEPENDENT_AMBULATORY_CARE_PROVIDER_SITE_OTHER): Payer: Medicare Other | Admitting: Cardiology

## 2017-05-03 ENCOUNTER — Encounter: Payer: Self-pay | Admitting: Cardiology

## 2017-05-03 VITALS — BP 122/66 | HR 65 | Ht 61.5 in | Wt 157.0 lb

## 2017-05-03 DIAGNOSIS — I493 Ventricular premature depolarization: Secondary | ICD-10-CM | POA: Diagnosis not present

## 2017-05-03 DIAGNOSIS — R002 Palpitations: Secondary | ICD-10-CM | POA: Diagnosis not present

## 2017-05-03 DIAGNOSIS — Z8249 Family history of ischemic heart disease and other diseases of the circulatory system: Secondary | ICD-10-CM | POA: Insufficient documentation

## 2017-05-03 DIAGNOSIS — R072 Precordial pain: Secondary | ICD-10-CM | POA: Diagnosis not present

## 2017-05-03 DIAGNOSIS — R931 Abnormal findings on diagnostic imaging of heart and coronary circulation: Secondary | ICD-10-CM | POA: Insufficient documentation

## 2017-05-03 NOTE — Telephone Encounter (Signed)
New message    Patient calling with follow up questions regarding 05/03/17 ov. Please call

## 2017-05-03 NOTE — Patient Instructions (Addendum)
NO MEDICATION  CHANGES      TEST   WILL SCHEDULE AT [address]  has ordered a CT coronary calcium score. This test is not covered with insurance . The cost is $150 , due the day of test.  Coronary CalciumScan A coronary calcium scan is an imaging test used to look for deposits of calcium and other fatty materials (plaques) in the inner lining of the blood vessels of the heart (coronary arteries). These deposits of calcium and plaques can partly clog and narrow the coronary arteries without producing any symptoms or warning signs. This puts a person at risk for a heart attack. This test can detect these deposits before symptoms develop. Tell a health care provider about:  Any allergies you have.  All medicines you are taking, including vitamins, herbs, eye drops, creams, and over-the-counter medicines.  Any problems you or family members have had with anesthetic medicines.  Any blood disorders you have.  Any surgeries you have had.  Any medical conditions you have.  Whether you are pregnant or may be pregnant. What are the risks? Generally, this is a safe procedure. However, problems may occur, including:  Harm to a pregnant woman and her unborn baby. This test involves the use of radiation. Radiation exposure can be dangerous to a pregnant woman and her unborn baby. If you are pregnant, you generally should not have this procedure done.  Slight increase in the risk of cancer. This is because of the radiation involved in the test. What happens before the procedure? No preparation is needed for this procedure. What happens during the procedure?  You will undress and remove any jewelry around your neck or chest.  You will put on a hospital gown.  Sticky electrodes will be placed on your chest. The electrodes will be connected to an electrocardiogram (ECG) machine to record a tracing of the electrical activity of your heart.  A CT scanner will take  pictures of your heart. During this time, you will be asked to lie still and hold your breath for 2-3 seconds while a picture of your heart is being taken. The procedure may vary among health care providers and hospitals. What happens after the procedure?  You can get dressed.  You can return to your normal activities.  It is up to you to get the results of your test. Ask your health care provider, or the department that is doing the test, when your results will be ready. Summary  A coronary calcium scan is an imaging test used to look for deposits of calcium and other fatty materials (plaques) in the inner lining of the blood vessels of the heart (coronary arteries).  Generally, this is a safe procedure. Tell your health care provider if you are pregnant or may be pregnant.  No preparation is needed for this procedure.  A CT scanner will take pictures of your heart.  You can return to your normal activities after the scan is done. This information is not intended to replace advice given to you by your health care provider. Make sure you discuss any questions you have with your health care provider. Document Released: 07/21/2007 Document Revised: 12/12/2015 Document Reviewed: 12/12/2015 Elsevier Interactive Patient Education  2017 [employer].

## 2017-05-03 NOTE — Progress Notes (Signed)
PCP: Joette Catching, MD  Clinic Note: Chief Complaint  Patient presents with  . New Patient (Initial Visit)    irregular heart beats  . Chest Pain    A few times when she has palpitations.    HPI: Kiara Miller is a 67 y.o. female who is being seen today for the evaluation of Chest Pain & Irregular Heart Beat at the request of Joette Catching, MD. Cardiac risk factors include hypertension, hyperlipidemia and family history of premature CAD (her father died from heart attack at age 86, and paternal grandmother had an aortic aneurysm).  -She was seen by Dr. Delane Ginger for cardiology evaluation for dyspnea palpitations back in 2012 --> evaluated with an echocardiogram and Lexiscan Myoview reviewed below.  DALE HOCHSTETLER was seen on March 26 by Dr. Lysbeth Galas.  She has been dealing with a crick in her neck a lot of neck and back pain.  This is probably related to her autoimmune disease versus cervical spine disease.  During this follow-up she noted some episodes of skipping heartbeats and some chest discomfort.  She is therefore referred for cardiology evaluation.  Recent Hospitalizations: None  Studies Personally Reviewed - (if available, images/films reviewed: From Epic Chart or Care Everywhere)  Echo 2012: EF 65-70%. Mild LVH. No RWMA/ Gr1 DD.  Lexiscan Myoview November 2012: Normal nuclear stress test.  No ischemia or infarction.  EF overestimated> 70%  Interval History: Kiara Miller is here today noticing that really she has not had true chest pain issues..  What she is been noticing off and on she is been having cold and bronchitis type symptoms since January at least 3 episodes or 3 exacerbations.  Back in January she is on antibiotics and she was worked up for shortness of breath.  At that time she was noting her heart skipping beats again (apparently in the past she had a history of PVCs).  What she describes as a slow pounding irregular heartbeats that can last for minutes to hours.  She  counted may be 18 skipped beats in a minute one time.  She felt like lump in her throat or tightness in her throat during the first episode of these episodes in the next morning had no more discomfort or dyspnea, but did have some palpitations.  This past Monday night however she had another episode where she was feeling these irregular heartbeats off and on just not as significant as last time and no chest discomfort or dyspnea associated with it. She is quick to point out that she is also been having some episodes of GERD with dysphagia.  She is been having a lot of postnasal drip type symptoms. Only the one episode of discomfort in her chest not associated with coughing and that was more of the throat pain.  No PND, orthopnea or edema.  No real resting or exertional dyspnea besides the fact that she is out of shape. No PND, orthopnea or edema  Feeling irregular heartbeats that are somewhat slow, but no rapid heartbeats or true symptoms to suggest an arrhythmia.  No lightheadedness, dizziness, weakness or syncope/near syncope. No TIA/amaurosis fugax symptoms. No melena, hematochezia, hematuria, or epstaxis. No claudication.  ROS: A comprehensive was performed. Review of Systems  Constitutional: Positive for malaise/fatigue. Negative for chills, fever and weight loss.  HENT: Positive for congestion and sore throat.   Respiratory: Positive for cough and shortness of breath (Only when she is having her cold symptoms.). Negative for sputum production and wheezing.  Cardiovascular:       Per HPI  Gastrointestinal: Positive for heartburn. Negative for abdominal pain, blood in stool, constipation, melena, nausea and vomiting.  Genitourinary: Negative for hematuria.  Musculoskeletal: Negative for falls and joint pain.  Neurological: Negative for dizziness, focal weakness and weakness.  Endo/Heme/Allergies: Negative for environmental allergies.  Psychiatric/Behavioral: Negative for depression and  memory loss. The patient is not nervous/anxious and does not have insomnia.   All other systems reviewed and are negative.  I have reviewed and (if needed) personally updated the patient's problem list, medications, allergies, past medical and surgical history, social and family history.   Past Medical History:  Diagnosis Date  . Diverticulitis April 2017  . Hyperlipidemia   . Hypertension   . Joint pain   . Polyarthralgia   . Right knee pain     Past Surgical History:  Procedure Laterality Date  . ABDOMINAL HYSTERECTOMY    . CARPAL TUNNEL RELEASE    . KNEE ARTHROSCOPY    . MANDIBLE SURGERY    . NM MYOVIEW LTD  12/2010   Normal nuclear stress test.  No ischemia or infarction.  EF overestimated> 70%  . OSTEOTOMY PELVIS BILATERAL    . TONSILLECTOMY    . TRANSTHORACIC ECHOCARDIOGRAM  12/2010   EF 65-70%. Mild LVH. No RWMA/ Gr1 DD.  . TUBAL LIGATION    . WRIST FUSION      Current Meds  Medication Sig  . amLODipine (NORVASC) 5 MG tablet Take 5 mg by mouth daily.    Marland Kitchen aspirin 81 MG tablet Take 81 mg by mouth every other day.   . levothyroxine (SYNTHROID, LEVOTHROID) 75 MCG tablet Take 75 mcg by mouth daily before breakfast.  . losartan-hydrochlorothiazide (HYZAAR) 100-25 MG per tablet Take 1 tablet by mouth daily.  . montelukast (SINGULAIR) 10 MG tablet Take 10 mg by mouth at bedtime.    . Omega-3 Fatty Acids (FISH OIL) 1200 MG CAPS Take 1,200 mg by mouth 2 (two) times daily.    . Vitamin D, Ergocalciferol, (DRISDOL) 50000 UNITS CAPS capsule Take 50,000 Units by mouth every 7 (seven) days. Takes on Wednesdays.  . [DISCONTINUED] Abatacept (ORENCIA IV) Inject into the vein.  . [DISCONTINUED] Probiotic Product (PROBIOTIC PO) Take by mouth daily.    Allergies  Allergen Reactions  . Clindamycin/Lincomycin Other (See Comments)    Severe esophagitis  . Codeine   . Leflunomide   . Levofloxacin   . Penicillins   . Statins   . Tetracyclines & Related     Social History    Tobacco Use  . Smoking status: Current Every Day Smoker    Packs/day: 0.50    Types: Cigarettes  . Smokeless tobacco: Never Used  Substance Use Topics  . Alcohol use: No  . Drug use: No   Social History   Social History Narrative  . Not on file    family history includes Hypertension in her father; Lung disease in her brother.  Wt Readings from Last 3 Encounters:  05/03/17 157 lb (71.2 kg)  01/09/17 156 lb (70.8 kg)  10/25/16 150 lb (68 kg)    PHYSICAL EXAM BP 122/66 (BP Location: Left Arm, Patient Position: Sitting, Cuff Size: Normal)   Pulse 65   Ht 5' 1.5" (1.562 m)   Wt 157 lb (71.2 kg)   BMI 29.18 kg/m  Physical Exam  Constitutional: She is oriented to person, place, and time. She appears well-nourished. No distress.  Healthy-appearing.  Well- groomed  HENT:  Head:  Normocephalic and atraumatic.  Eyes: Pupils are equal, round, and reactive to light. Conjunctivae and EOM are normal. No scleral icterus.  Neck: Neck supple. No hepatojugular reflux and no JVD present. Carotid bruit is not present.  Cardiovascular: Normal rate, regular rhythm, normal heart sounds, intact distal pulses and normal pulses.  No extrasystoles are present. PMI is not displaced. Exam reveals no gallop and no friction rub.  No murmur heard. Pulmonary/Chest: Effort normal and breath sounds normal. No respiratory distress. She has no wheezes. She has no rales (Intermittent rhonchi).  Abdominal: Soft. Bowel sounds are normal. She exhibits no distension. There is no tenderness. There is no rebound.  Musculoskeletal: Normal range of motion. She exhibits no edema.  Neurological: She is alert and oriented to person, place, and time. No cranial nerve deficit.  Skin: Skin is warm and dry. No rash noted. No erythema. No pallor.  Psychiatric: She has a normal mood and affect. Judgment and thought content normal.  Nursing note and vitals reviewed.    Adult ECG Report  Rate: 65 ;  Rhythm: normal sinus  rhythm and Cannot exclude inferior MI, age undetermined with Q waves.  Also poor R wave progression noted.  Otherwise normal axis, intervals and durations.;   Narrative Interpretation: Relatively normal EKG   Other studies Reviewed: Additional studies/ records that were reviewed today include:  Recent Labs:   No results found for: CHOL, HDL, LDLCALC, LDLDIRECT, TRIG, CHOLHDL Lab Results  Component Value Date   CREATININE 0.67 10/25/2016   BUN 25 (H) 10/25/2016   NA 136 10/25/2016   K 3.9 10/25/2016   CL 99 (L) 10/25/2016   CO2 25 10/25/2016   Lab Results  Component Value Date   WBC 7.4 10/25/2016   HGB 16.1 (H) 10/25/2016   HCT 46.5 (H) 10/25/2016   MCV 90.1 10/25/2016   PLT 188 10/25/2016    ASSESSMENT / PLAN: Problem List Items Addressed This Visit    PVC's (premature ventricular contractions) (Chronic)    Previously documented.  I think that is probably what she is feeling.  As they are not overly worrisome to her, and probably not happening without much frequency, the plan will be to have her monitor symptoms and consider using smart phone application for evaluation called [employer] by [employer].      Relevant Orders   EKG 12-Lead   CT CARDIAC SCORING   Palpitations - Primary    Noted to be during slow heartbeats.  Not likely to be an arrhythmia.  Consistent with her previously known PVCs.  We talked about using smart phone application to evaluate these when they do occur as are not occurring with great frequency.  Will probably not capture using a monitor.      Relevant Orders   EKG 12-Lead   CT CARDIAC SCORING   Family history of premature CAD    She is concerned about her family history of premature CAD and would like to have a baseline evaluation.  Since she is not having active anginal symptoms I do not think necessarily a stress test to be all that helpful, however I do think that is reasonable to get a baseline evaluation of her cardiovascular risk.,  We will check  with a coronary calcium score and go from there.      Relevant Orders   EKG 12-Lead   CT CARDIAC SCORING   Chest pain, precordial    Mild precordial pain.  I think this is probably related to her pulmonary  issues and not cardiac in nature.  Based on evaluation with coronary calcium score.      Relevant Orders   EKG 12-Lead   CT CARDIAC SCORING      I spent a total of 45 minutes with the patient and chart review. >  50% of the time was spent in direct patient consultation.   Current medicines are reviewed at length with the patient today.  (+/- concerns) n/a The following changes have been made:  n/a  Patient Instructions  NO MEDICATION  CHANGES    TEST   WILL SCHEDULE AT [address]  has ordered a CT coronary calcium score. This test is not covered with insurance . The cost is $150 , due the day of test.    Studies Ordered:   Orders Placed This Encounter  Procedures  . CT CARDIAC SCORING  . EKG 12-Lead      Bryan Lemma, M.D., M.S. Interventional Cardiologist   Pager # [phone] Phone # [phone] [address] [place], [place] [address]   Thank you for choosing [facility] at [facility]!!

## 2017-05-03 NOTE — Telephone Encounter (Signed)
Returned call to patient who was seen today by Dr. Herbie Baltimore. She is inquiring are [employer] monitoring system. Explained the app/service and provided patient with the website info

## 2017-05-05 ENCOUNTER — Encounter: Payer: Self-pay | Admitting: Cardiology

## 2017-05-05 DIAGNOSIS — R072 Precordial pain: Secondary | ICD-10-CM | POA: Insufficient documentation

## 2017-05-05 NOTE — Assessment & Plan Note (Signed)
Previously documented.  I think that is probably what she is feeling.  As they are not overly worrisome to her, and probably not happening without much frequency, the plan will be to have her monitor symptoms and consider using smart phone application for evaluation called [employer].

## 2017-05-05 NOTE — Assessment & Plan Note (Signed)
She is concerned about her family history of premature CAD and would like to have a baseline evaluation.  Since she is not having active anginal symptoms I do not think necessarily a stress test to be all that helpful, however I do think that is reasonable to get a baseline evaluation of her cardiovascular risk.,  We will check with a coronary calcium score and go from there.

## 2017-05-05 NOTE — Assessment & Plan Note (Signed)
Noted to be during slow heartbeats.  Not likely to be an arrhythmia.  Consistent with her previously known PVCs.  We talked about using smart phone application to evaluate these when they do occur as are not occurring with great frequency.  Will probably not capture using a monitor.

## 2017-05-05 NOTE — Assessment & Plan Note (Signed)
Mild precordial pain.  I think this is probably related to her pulmonary issues and not cardiac in nature.  Based on evaluation with coronary calcium score.

## 2017-05-23 ENCOUNTER — Ambulatory Visit
Admission: RE | Admit: 2017-05-23 | Discharge: 2017-05-23 | Disposition: A | Discharge status: Home / Self Care | Payer: Medicare Other | Source: Ambulatory Visit | Attending: Family Medicine | Admitting: Family Medicine

## 2017-05-23 ENCOUNTER — Ambulatory Visit (INDEPENDENT_AMBULATORY_CARE_PROVIDER_SITE_OTHER)
Admission: RE | Admit: 2017-05-23 | Discharge: 2017-05-23 | Disposition: A | Discharge status: Home / Self Care | Payer: Self-pay | Source: Ambulatory Visit | Attending: Cardiology | Admitting: Cardiology

## 2017-05-23 DIAGNOSIS — R002 Palpitations: Secondary | ICD-10-CM

## 2017-05-23 DIAGNOSIS — Z1231 Encounter for screening mammogram for malignant neoplasm of breast: Secondary | ICD-10-CM

## 2017-05-23 DIAGNOSIS — R072 Precordial pain: Secondary | ICD-10-CM

## 2017-05-23 DIAGNOSIS — I493 Ventricular premature depolarization: Secondary | ICD-10-CM

## 2017-05-23 DIAGNOSIS — Z8249 Family history of ischemic heart disease and other diseases of the circulatory system: Secondary | ICD-10-CM

## 2017-05-28 NOTE — Progress Notes (Signed)
Office Visit Note  Patient: Kiara Miller             Date of Birth: January 25, 1951           MRN: [id number]             PCP: Joette Catching, MD Referring: Joette Catching, MD Visit Date: 06/11/2017 Occupation: @GUAROCC @    Subjective:  Pain in multiple joints.   History of Present Illness: Kiara Miller is a 67 y.o. female with history of seronegative rheumatoid arthritis, osteoarthritis.  She states she has been having increased pain in her C-spine, right shoulder and lower back.  She had been seeing a chiropractor for that.  She said the symptoms have improved since she has been seeing chiropractor on a regular basis.  He also has some discomfort in her right knee joint and left ankle which persist.  He denies any joint swelling.  She states recently she has been having a lot of coccyx pain and she has difficulty sitting on the chair.  She had recent x-ray of the coccyx which was read within normal limits.  She has an area of calcification next to the coccyx bone.  She states she will be getting MRI through her PCP for that.  Activities of Daily Living:  Patient reports morning stiffness for 30 minutes.   Patient Reports nocturnal pain.  Difficulty dressing/grooming: Denies Difficulty climbing stairs: Reports Difficulty getting out of chair: Reports Difficulty using hands for taps, buttons, cutlery, and/or writing: Denies   Review of Systems  Constitutional: Positive for fatigue. Negative for night sweats, weight gain and weight loss.  HENT: Negative for mouth sores, trouble swallowing, trouble swallowing, mouth dryness and nose dryness.   Eyes: Negative for pain, redness, visual disturbance and dryness.  Respiratory: Negative for cough, shortness of breath and difficulty breathing.   Cardiovascular: Positive for palpitations and hypertension. Negative for chest pain, irregular heartbeat and swelling in legs/feet.  Gastrointestinal: Negative for blood in stool, constipation and  diarrhea.  Endocrine: Negative for increased urination.  Genitourinary: Negative for vaginal dryness.  Musculoskeletal: Positive for arthralgias, joint pain and morning stiffness. Negative for joint swelling, myalgias, muscle weakness, muscle tenderness and myalgias.  Skin: Negative for color change, rash, hair loss, skin tightness, ulcers and sensitivity to sunlight.  Allergic/Immunologic: Negative for susceptible to infections.  Neurological: Negative for dizziness, memory loss, night sweats and weakness.  Hematological: Negative for swollen glands.  Psychiatric/Behavioral: Negative for depressed mood and sleep disturbance. The patient is not nervous/anxious.     PMFS History:  Patient Active Problem List   Diagnosis Date Noted  . History of hypothyroidism 06/11/2017  . History of hypertension 06/11/2017  . History of obesity 06/11/2017  . S/P right knee arthroscopy 06/11/2017  . DDD (degenerative disc disease), cervical 06/11/2017  . DDD (degenerative disc disease), lumbar 06/11/2017  . History of carpal tunnel surgery of right wrist 06/11/2017  . Primary osteoarthritis of both knees 06/11/2017  . Chest pain, precordial 05/05/2017  . PVC's (premature ventricular contractions) 05/03/2017  . Family history of premature CAD 05/03/2017  . 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 Uveitis 10/10/2015  . Diverticulitis 10/10/2015  . Dyspnea 12/05/2010  . Palpitations 12/05/2010    Past Medical History:  Diagnosis Date  . Diverticulitis April 2017  . Hyperlipidemia   . Hypertension   . Joint pain   . Polyarthralgia   . Right knee pain     Family History  Problem Relation Age of Onset  . Hypertension Father   . Lung disease Brother    Past Surgical History:  Procedure Laterality Date  . ABDOMINAL HYSTERECTOMY    . CARPAL TUNNEL RELEASE    . KNEE ARTHROSCOPY    . MANDIBLE SURGERY    . NM MYOVIEW LTD  12/2010   Normal nuclear  stress test.  No ischemia or infarction.  EF overestimated> 70%  . OSTEOTOMY PELVIS BILATERAL    . TONSILLECTOMY    . TRANSTHORACIC ECHOCARDIOGRAM  12/2010   EF 65-70%. Mild LVH. No RWMA/ Gr1 DD.  . TUBAL LIGATION    . WRIST FUSION     Social History   Social History Narrative  . Not on file     Objective: Vital Signs: BP 120/73 (BP Location: Left Arm, Patient Position: Sitting, Cuff Size: Normal)   Pulse 66   Resp 16   Ht 5\' 1"  (1.549 m)   Wt 161 lb (73 kg)   BMI 30.42 kg/m    Physical Exam  Constitutional: She is oriented to person, place, and time. She appears well-developed and well-nourished.  HENT:  Head: Normocephalic and atraumatic.  Eyes: Conjunctivae and EOM are normal.  Neck: Normal range of motion.  Cardiovascular: Normal rate, regular rhythm, normal heart sounds and intact distal pulses.  Pulmonary/Chest: Effort normal and breath sounds normal.  Abdominal: Soft. Bowel sounds are normal.  Lymphadenopathy:    She has no cervical adenopathy.  Neurological: She is alert and oriented to person, place, and time.  Skin: Skin is warm and dry. Capillary refill takes less than 2 seconds.  Psychiatric: She has a normal mood and affect. Her behavior is normal.  Nursing note and vitals reviewed.    Musculoskeletal Exam: C-spine, thoracic and lumbar spine limited range of motion without much discomfort.  Shoulder joints, elbow joints, wrist joints were in good range of motion her left wrist joint is fused.  She had some DIP PIP thickening no active synovitis was noted over wrist joint or MCP joints.  Hip joints were in good range of motion.  She has some warmth in her right knee joint and discomfort with range of motion.  No MTP PIP DIP swelling was noted.  CDAI Exam: CDAI Homunculus Exam:   Joint Counts:  CDAI Tender Joint count: 0 CDAI Swollen Joint count: 0  Global Assessments:  Patient Global Assessment: 1 Provider Global Assessment: 0  CDAI Calculated Score:  1    Investigation: No additional findings. CBC Latest Ref Rng & Units 10/25/2016 08/30/2016 06/07/2016  WBC 4.0 - 10.5 K/uL 7.4 7.3 6.5  Hemoglobin 12.0 - 15.0 g/dL 16.1(H) 14.9 16.2(H)  Hematocrit 36.0 - 46.0 % 46.5(H) 42.7 46.7(H)  Platelets 150 - 400 K/uL 188 175 174   CMP Latest Ref Rng & Units 10/25/2016 08/30/2016 08/02/2016  Glucose 65 - 99 mg/dL 81 88 94  BUN 6 - 20 mg/dL 08/04/2016) 60(A) 54(U)  Creatinine 0.44 - 1.00 mg/dL 98(J 1.91 4.78  Sodium 135 - 145 mmol/L 136 135 136  Potassium 3.5 - 5.1 mmol/L 3.9 4.4 3.8  Chloride 101 - 111 mmol/L 99(L) 103 101  CO2 22 - 32 mmol/L 25 24 22   Calcium 8.9 - 10.3 mg/dL 9.7 9.4 9.5  Total Protein 6.5 - 8.1 g/dL 7.5 6.7 7.3  Total Bilirubin 0.3 - 1.2 mg/dL 0.7 0.9 0.9  Alkaline Phos 38 - 126 U/L 52 45 50  AST 15 - 41 U/L 23 23 22   ALT 14 -  54 U/L 22 19 18     Imaging: Ct Cardiac Scoring  Addendum Date: 05/23/2017   ADDENDUM REPORT: 05/23/2017 15:33 CLINICAL DATA:  Risk stratification EXAM: Coronary Calcium Score TECHNIQUE: The patient was scanned on a [employer]. Axial non-contrast 3 mm slices were carried out through the heart. The data set was analyzed on a dedicated work station and scored using the Agatson method. FINDINGS: Non-cardiac: See separate report from [facility]. Ascending Aorta: Normal size, mild diffuse calcifications. Pericardium: Normal. Coronary arteries: Normal origin. IMPRESSION: Coronary calcium score of 376. This was 27 percentile for age and sex matched control. Electronically Signed   By: Tobias Alexander   On: 05/23/2017 15:33   Result Date: 05/23/2017 EXAM: OVER-READ INTERPRETATION  CT CHEST The following report is an over-read performed by radiologist Dr. Charlett Nose of [facility] on 05/23/2017. This over-read does not include interpretation of cardiac or coronary anatomy or pathology. The coronary calcium score interpretation by the cardiologist is attached. COMPARISON:  None. FINDINGS:  Vascular: Heart is normal size. Visualized aorta is normal caliber. Scattered aortic calcifications. Mediastinum/Nodes: No adenopathy in the lower mediastinum or hila. Lungs/Pleura: Scarring or atelectasis in the lung bases. No effusions. Upper Abdomen: Imaging into the upper abdomen shows no acute findings. Musculoskeletal: Chest wall soft tissues are unremarkable. No acute bony abnormality. IMPRESSION: No acute extra cardiac abnormality. Aortic atherosclerosis. Bibasilar atelectasis or scarring. Electronically Signed: By: Charlett Nose M.D. On: 05/23/2017 12:04   Mm Screening Breast Tomo Bilateral  Result Date: 05/24/2017 CLINICAL DATA:  Screening. EXAM: DIGITAL SCREENING BILATERAL MAMMOGRAM WITH TOMO AND CAD COMPARISON:  Previous exam(s). ACR Breast Density Category b: There are scattered areas of fibroglandular density. FINDINGS: There are no findings suspicious for malignancy. Images were processed with CAD. IMPRESSION: No mammographic evidence of malignancy. A result letter of this screening mammogram will be mailed directly to the patient. RECOMMENDATION: Screening mammogram in one year. (Code:SM-B-01Y) BI-RADS CATEGORY  1: Negative. Electronically Signed   By: Bary Richard M.D.   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  Patient has no synovitis on examination today.  She has been off Orencia for a while.  At this point she does not want to start on any medications..  High risk medication use - She discontinued Orencia IV.  Last infusion was in September 2018  Coccyx pain-  I offered physical  therapy but she declined.  She states she will be getting MRI of her coccyx through her PCP  Primary osteoarthritis of both knees-she is ongoing pain and discomfort in her right knee joint.  Weight loss diet and exercise was discussed.  S/P right knee arthroscopy - meniscal repair  DDD (degenerative disc disease), cervical-she is doing better after chiropractor care.  DDD (degenerative disc disease), lumbar-not having much discomfort.  Status post fusion of wrist  History of carpal tunnel surgery of right wrist  Smoker-smoking cessation was discussed at length.  Association of smoking with rheumatoid arthritis was also discussed.  History of diverticulitis  History of obesity  History of hypothyroidism  History of hypertension    Orders: No orders of the defined types were placed in  this encounter.  No orders of the defined types were placed in this encounter.   Face-to-face time spent with patient was 30 minutes. >50% of time was spent in counseling and coordination of care.  Follow-Up Instructions: Return in about 6 months (around 12/12/2017) for Rheumatoid arthritis, Osteoarthritis,DDD, Osteoarthritis.   Pollyann Savoy, MD  Note - This record has been created using Animal nutritionist.  Chart creation errors have been sought, but may not always  have been located. Such creation errors do not reflect on  the standard of medical care.

## 2017-05-29 ENCOUNTER — Telehealth: Payer: Self-pay | Admitting: *Deleted

## 2017-05-29 DIAGNOSIS — R002 Palpitations: Secondary | ICD-10-CM

## 2017-05-29 DIAGNOSIS — I493 Ventricular premature depolarization: Secondary | ICD-10-CM

## 2017-05-29 DIAGNOSIS — R072 Precordial pain: Secondary | ICD-10-CM

## 2017-05-29 DIAGNOSIS — R931 Abnormal findings on diagnostic imaging of heart and coronary circulation: Secondary | ICD-10-CM

## 2017-05-29 NOTE — Telephone Encounter (Signed)
Spoke to patient. Result given . Verbalized understanding Patient is agreeable to have stress myoview  Test done - will need to have done prior to 07/11/17 next appointment with DR HARDING.  Patient aware scheduler will call with appointment for [facility]

## 2017-05-29 NOTE — Telephone Encounter (Signed)
-----   Message from Marykay Lex, MD sent at 05/24/2017 12:27 PM EDT ----- Coronary calcium score is 376 which would be someone in the intermediate risk category.  It would be enough to warrant further evaluation with either coronary CT angiogram or stress test.  My understanding from initial encounter was that she Has not had been having any angina type symptoms.  She has been noticing palpitations and irregular heartbeats which could be an anginal equivalent however. My recommendation would be Myoview stress test.  If she is agreeable, we can have her schedule before I see her back.  Bryan Lemma, MD

## 2017-06-04 ENCOUNTER — Telehealth (HOSPITAL_COMMUNITY): Payer: Self-pay

## 2017-06-04 NOTE — Telephone Encounter (Signed)
Encounter complete. 

## 2017-06-05 HISTORY — PX: NM MYOVIEW LTD: HXRAD82

## 2017-06-06 ENCOUNTER — Ambulatory Visit (HOSPITAL_COMMUNITY)
Admission: RE | Admit: 2017-06-06 | Discharge: 2017-06-06 | Disposition: A | Discharge status: Home / Self Care | Payer: Medicare Other | Source: Ambulatory Visit | Attending: Cardiology | Admitting: Cardiology

## 2017-06-06 DIAGNOSIS — I493 Ventricular premature depolarization: Secondary | ICD-10-CM | POA: Insufficient documentation

## 2017-06-06 DIAGNOSIS — Z8249 Family history of ischemic heart disease and other diseases of the circulatory system: Secondary | ICD-10-CM | POA: Insufficient documentation

## 2017-06-06 DIAGNOSIS — R072 Precordial pain: Secondary | ICD-10-CM | POA: Insufficient documentation

## 2017-06-06 DIAGNOSIS — F17200 Nicotine dependence, unspecified, uncomplicated: Secondary | ICD-10-CM | POA: Insufficient documentation

## 2017-06-06 DIAGNOSIS — M549 Dorsalgia, unspecified: Secondary | ICD-10-CM | POA: Diagnosis not present

## 2017-06-06 DIAGNOSIS — M069 Rheumatoid arthritis, unspecified: Secondary | ICD-10-CM | POA: Insufficient documentation

## 2017-06-06 DIAGNOSIS — R002 Palpitations: Secondary | ICD-10-CM | POA: Diagnosis not present

## 2017-06-06 DIAGNOSIS — R931 Abnormal findings on diagnostic imaging of heart and coronary circulation: Secondary | ICD-10-CM | POA: Insufficient documentation

## 2017-06-06 DIAGNOSIS — K219 Gastro-esophageal reflux disease without esophagitis: Secondary | ICD-10-CM | POA: Diagnosis not present

## 2017-06-06 DIAGNOSIS — I10 Essential (primary) hypertension: Secondary | ICD-10-CM | POA: Diagnosis not present

## 2017-06-06 DIAGNOSIS — R5383 Other fatigue: Secondary | ICD-10-CM | POA: Insufficient documentation

## 2017-06-06 MED ORDER — REGADENOSON 0.4 MG/5ML IV SOLN
0.4000 mg | Freq: Once | INTRAVENOUS | Status: AC
  Administered 2017-06-06: 0.4 mg via INTRAVENOUS

## 2017-06-06 MED ORDER — TECHNETIUM TC 99M TETROFOSMIN IV KIT
31.0000 | PACK | Freq: Once | INTRAVENOUS | Status: AC | PRN
  Administered 2017-06-06: 31 via INTRAVENOUS
  Filled 2017-06-06: qty 31

## 2017-06-06 MED ORDER — AMINOPHYLLINE 25 MG/ML IV SOLN
75.0000 mg | Freq: Once | INTRAVENOUS | Status: AC
  Administered 2017-06-06: 75 mg via INTRAVENOUS

## 2017-06-06 MED ORDER — TECHNETIUM TC 99M TETROFOSMIN IV KIT
10.7000 | PACK | Freq: Once | INTRAVENOUS | Status: AC | PRN
  Administered 2017-06-06: 10.7 via INTRAVENOUS
  Filled 2017-06-06: qty 11

## 2017-06-07 LAB — MYOCARDIAL PERFUSION IMAGING
CHL CUP NUCLEAR SSS: 3
LV sys vol: 14 mL
LVDIAVOL: 51 mL (ref 46–106)
NUC STRESS TID: 1.3
Peak HR: 90 {beats}/min
Rest HR: 62 {beats}/min
SDS: 0
SRS: 3

## 2017-06-11 ENCOUNTER — Ambulatory Visit (INDEPENDENT_AMBULATORY_CARE_PROVIDER_SITE_OTHER): Payer: Medicare Other | Admitting: Rheumatology

## 2017-06-11 ENCOUNTER — Encounter: Payer: Self-pay | Admitting: Rheumatology

## 2017-06-11 VITALS — BP 120/73 | HR 66 | Resp 16 | Ht 61.0 in | Wt 161.0 lb

## 2017-06-11 DIAGNOSIS — M0609 Rheumatoid arthritis without rheumatoid factor, multiple sites: Secondary | ICD-10-CM

## 2017-06-11 DIAGNOSIS — Z981 Arthrodesis status: Secondary | ICD-10-CM | POA: Diagnosis not present

## 2017-06-11 DIAGNOSIS — M51369 Other intervertebral disc degeneration, lumbar region without mention of lumbar back pain or lower extremity pain: Secondary | ICD-10-CM | POA: Insufficient documentation

## 2017-06-11 DIAGNOSIS — Z9889 Other specified postprocedural states: Secondary | ICD-10-CM

## 2017-06-11 DIAGNOSIS — M5030 Other cervical disc degeneration, unspecified cervical region: Secondary | ICD-10-CM

## 2017-06-11 DIAGNOSIS — Z8639 Personal history of other endocrine, nutritional and metabolic disease: Secondary | ICD-10-CM

## 2017-06-11 DIAGNOSIS — M170 Bilateral primary osteoarthritis of knee: Secondary | ICD-10-CM | POA: Diagnosis not present

## 2017-06-11 DIAGNOSIS — Z79899 Other long term (current) drug therapy: Secondary | ICD-10-CM | POA: Diagnosis not present

## 2017-06-11 DIAGNOSIS — M533 Sacrococcygeal disorders, not elsewhere classified: Secondary | ICD-10-CM

## 2017-06-11 DIAGNOSIS — M5136 Other intervertebral disc degeneration, lumbar region: Secondary | ICD-10-CM

## 2017-06-11 DIAGNOSIS — Z8719 Personal history of other diseases of the digestive system: Secondary | ICD-10-CM

## 2017-06-11 DIAGNOSIS — Z8679 Personal history of other diseases of the circulatory system: Secondary | ICD-10-CM

## 2017-06-11 DIAGNOSIS — I10 Essential (primary) hypertension: Secondary | ICD-10-CM | POA: Insufficient documentation

## 2017-06-11 DIAGNOSIS — F17200 Nicotine dependence, unspecified, uncomplicated: Secondary | ICD-10-CM

## 2017-06-25 ENCOUNTER — Other Ambulatory Visit (HOSPITAL_COMMUNITY): Payer: Self-pay | Admitting: Family Medicine

## 2017-06-25 DIAGNOSIS — M533 Sacrococcygeal disorders, not elsewhere classified: Secondary | ICD-10-CM

## 2017-07-02 ENCOUNTER — Ambulatory Visit (HOSPITAL_COMMUNITY)
Admission: RE | Admit: 2017-07-02 | Discharge: 2017-07-02 | Disposition: A | Discharge status: Home / Self Care | Payer: Medicare Other | Source: Ambulatory Visit | Attending: Family Medicine | Admitting: Family Medicine

## 2017-07-02 DIAGNOSIS — M533 Sacrococcygeal disorders, not elsewhere classified: Secondary | ICD-10-CM | POA: Insufficient documentation

## 2017-07-02 DIAGNOSIS — K5790 Diverticulosis of intestine, part unspecified, without perforation or abscess without bleeding: Secondary | ICD-10-CM | POA: Diagnosis not present

## 2017-07-02 DIAGNOSIS — M47816 Spondylosis without myelopathy or radiculopathy, lumbar region: Secondary | ICD-10-CM | POA: Insufficient documentation

## 2017-07-03 ENCOUNTER — Ambulatory Visit (HOSPITAL_COMMUNITY): Payer: Medicare Other

## 2017-07-11 ENCOUNTER — Ambulatory Visit: Payer: Medicare Other | Admitting: Cardiology

## 2017-07-11 ENCOUNTER — Telehealth: Payer: Self-pay | Admitting: Cardiology

## 2017-07-11 ENCOUNTER — Encounter: Payer: Self-pay | Admitting: Cardiology

## 2017-07-11 ENCOUNTER — Ambulatory Visit (INDEPENDENT_AMBULATORY_CARE_PROVIDER_SITE_OTHER): Payer: Medicare Other | Admitting: Cardiology

## 2017-07-11 VITALS — BP 104/62 | HR 69 | Ht 61.0 in | Wt 157.0 lb

## 2017-07-11 DIAGNOSIS — I491 Atrial premature depolarization: Secondary | ICD-10-CM

## 2017-07-11 DIAGNOSIS — I10 Essential (primary) hypertension: Secondary | ICD-10-CM

## 2017-07-11 DIAGNOSIS — Z8249 Family history of ischemic heart disease and other diseases of the circulatory system: Secondary | ICD-10-CM

## 2017-07-11 DIAGNOSIS — R002 Palpitations: Secondary | ICD-10-CM

## 2017-07-11 MED ORDER — METOPROLOL SUCCINATE ER 50 MG PO TB24: 50.0000 mg | ORAL_TABLET | Freq: Every day | ORAL | 3 refills | Status: DC

## 2017-07-11 MED ORDER — METOPROLOL SUCCINATE ER 25 MG PO TB24: 25.0000 mg | ORAL_TABLET | Freq: Every day | ORAL | 3 refills | Status: DC

## 2017-07-11 NOTE — Progress Notes (Signed)
PCP: Joette Catching, MD  Clinic Note: Chief Complaint  Patient presents with  . Follow-up    2 months  . Headache  . Shortness of Breath    HPI: Kiara Miller is a 67 y.o. female who is being seen today for the evaluation of Chest Pain & Irregular Heart Beat at the request of Joette Catching, MD. Cardiac risk factors include hypertension, hyperlipidemia and family history of premature CAD (her father died from heart attack at age 1, and paternal grandmother had an aortic aneurysm).   Cardiology Evaluation in 2012 (Dr. Elease Hashimoto) --> evaluated with an echocardiogram and Lexiscan Myoview (see PSH)  Kiara Miller was seen on March 29 for off-and-on episodes of skipping beats and some chest discomfort.  She felt pounding sensations in her heart.  She has been noticing the symptoms since January.  She was evaluated with a coronary calcium score which was somewhat elevated and therefore followed up with a Myoview stress test.  She herself purchased a portable home cardiac monitor [facility]) that she brings in with her today.  Recent Hospitalizations: None  Studies Personally Reviewed - (if available, images/films reviewed: From Epic Chart or Care Everywhere)  Coronary calcium score: 376  Myoview Jun 07, 2017: LOW RISK.  EF 72%.  No ischemia or infarction.  No wall motion normality.  No change from previous.  EMAY personal event monitor; PACs  &PACs in Trigeminy  Interval History: [name] is here today indicating that she still is having intermittent spells of irregular heartbeats, but not as frequent and not as long-lasting.  She has had some spells where she felt quite fatigued and lightheaded as well as dizzy.  She had one episode where she felt balance being off and been very lightheaded woozy.  But never actually passed out.  The longest episode she is noted of late has been less than 30 seconds.  It happens with or without activity.  Usually noted without activity.  Her monitor showed at  least 3-4 episodes of what looks like PACs with trigeminy.  She has not had anything like the bad episodes she had before, and recorded when she has been having.   Otherwise, she really was happy with the results of her stress test and has not really had any further chest tightness or pressure type symptoms either. No resting or exertional dyspnea.  No syncope/near syncope or TIA/amaurosis fugax.  No PND, orthopnea or edema.  ROS: A comprehensive was performed. Review of Systems  Constitutional: Negative for chills, fever, malaise/fatigue and weight loss.  HENT: Negative for sore throat.   Respiratory: Negative for cough, sputum production, shortness of breath (None since her cold resolved ) and wheezing.   Cardiovascular:       Per HPI  Gastrointestinal: Positive for heartburn. Negative for abdominal pain, blood in stool, constipation, melena, nausea and vomiting.  Genitourinary: Negative for hematuria.  Musculoskeletal: Negative for falls and joint pain.  Neurological: Negative for dizziness, focal weakness and weakness.  Endo/Heme/Allergies: Negative for environmental allergies.  Psychiatric/Behavioral: Negative for depression and memory loss. The patient is not nervous/anxious and does not have insomnia.   All other systems reviewed and are negative.  I have reviewed and (if needed) personally updated the patient's problem list, medications, allergies, past medical and surgical history, social and family history.   Past Medical History:  Diagnosis Date  . Diverticulitis April 2017  . Hyperlipidemia   . Hypertension   . Joint pain   . Polyarthralgia   .  Right knee pain     Past Surgical History:  Procedure Laterality Date  . ABDOMINAL HYSTERECTOMY    . CARPAL TUNNEL RELEASE    . KNEE ARTHROSCOPY    . MANDIBLE SURGERY    . NM MYOVIEW LTD  12/2010   Normal nuclear stress test.  No ischemia or infarction.  EF overestimated> 70%  . OSTEOTOMY PELVIS BILATERAL    . TONSILLECTOMY     . TRANSTHORACIC ECHOCARDIOGRAM  12/2010   EF 65-70%. Mild LVH. No RWMA/ Gr1 DD.  . TUBAL LIGATION    . WRIST FUSION      Current Meds  Medication Sig  . aspirin 81 MG tablet Take 81 mg by mouth every other day.   . levothyroxine (SYNTHROID, LEVOTHROID) 75 MCG tablet Take 75 mcg by mouth daily before breakfast.  . losartan-hydrochlorothiazide (HYZAAR) 100-25 MG per tablet Take 1 tablet by mouth daily.  . montelukast (SINGULAIR) 10 MG tablet Take 10 mg by mouth at bedtime.    . Omega-3 Fatty Acids (FISH OIL) 1200 MG CAPS Take 1,200 mg by mouth 2 (two) times daily.    . Vitamin D, Ergocalciferol, (DRISDOL) 50000 UNITS CAPS capsule Take 50,000 Units by mouth every 7 (seven) days. Takes on Wednesdays.  . [DISCONTINUED] amLODipine (NORVASC) 5 MG tablet Take 5 mg by mouth daily.      Allergies  Allergen Reactions  . Clindamycin/Lincomycin Other (See Comments)    Severe esophagitis  . Codeine   . Leflunomide   . Levofloxacin   . Penicillins   . Statins   . Tetracyclines & Related     Social History   Tobacco Use  . Smoking status: Current Every Day Smoker    Packs/day: 0.50    Types: Cigarettes  . Smokeless tobacco: Never Used  Substance Use Topics  . Alcohol use: No  . Drug use: No   Social History   Social History Narrative  . Not on file    family history includes Hypertension in her father; Lung disease in her brother.  Wt Readings from Last 3 Encounters:  07/11/17 157 lb (71.2 kg)  06/11/17 161 lb (73 kg)  06/06/17 157 lb (71.2 kg)    PHYSICAL EXAM BP 104/62 (BP Location: Left Arm, Patient Position: Sitting, Cuff Size: Normal)   Pulse 69   Ht 5\' 1"  (1.549 m)   Wt 157 lb (71.2 kg)   BMI 29.66 kg/m  Physical Exam  Constitutional: She is oriented to person, place, and time. She appears well-nourished. No distress.  Healthy-appearing.  Well- groomed  HENT:  Head: Normocephalic and atraumatic.  Eyes: EOM are normal.  Neck: Normal range of motion. Neck  supple. No hepatojugular reflux and no JVD present. Carotid bruit is not present.  Cardiovascular: Normal rate, regular rhythm, normal heart sounds, intact distal pulses and normal pulses.  No extrasystoles are present. PMI is not displaced. Exam reveals no gallop and no friction rub.  No murmur heard. Pulmonary/Chest: Effort normal. No respiratory distress. She has no wheezes. She has no rales (Or rhonchi).  Abdominal: Soft. Bowel sounds are normal. She exhibits no distension. There is no tenderness. There is no rebound.  Musculoskeletal: Normal range of motion. She exhibits no edema.  Neurological: She is alert and oriented to person, place, and time.  Psychiatric: She has a normal mood and affect. Her behavior is normal. Judgment and thought content normal.  Nursing note and vitals reviewed.    Adult ECG Report None  Other studies Reviewed: Additional  studies/ records that were reviewed today include:  Recent Labs:   No results found for: CHOL, HDL, LDLCALC, LDLDIRECT, TRIG, CHOLHDL Lab Results  Component Value Date   CREATININE 0.67 10/25/2016   BUN 25 (H) 10/25/2016   NA 136 10/25/2016   K 3.9 10/25/2016   CL 99 (L) 10/25/2016   CO2 25 10/25/2016   Lab Results  Component Value Date   WBC 7.4 10/25/2016   HGB 16.1 (H) 10/25/2016   HCT 46.5 (H) 10/25/2016   MCV 90.1 10/25/2016   PLT 188 10/25/2016    ASSESSMENT / PLAN: Problem List Items Addressed This Visit    Palpitations    Continue to follow with home monitor - may need PRN short acting Beta blocker if Toprol is not fully effective.      PAC (premature atrial contraction) - Primary (Chronic)    Interestingly, her monitor shows more PACs than PVCs seen often in bi or trigeminy. - .  She in the past and had a history of PVCs.  Plan - D/c amlodipine - take Toprol 25 mg @ night       Relevant Medications   metoprolol succinate (TOPROL XL) 25 MG 24 hr tablet   Family history of premature CAD (Chronic)    Good news  that her Myoview was negative for ischemia, that does not mean that she does not have cardia vascular disease.  Her coronary calcium score was high and therefore would recommend LDL closer to 7000.  Her most recent labs were from 2017.  We discussed this in six-month follow-up.  I am not sure with her PCP rechecking labs.  Low threshold for statin      Essential hypertension (Chronic)    Replacing amlodipine with Toprol and continuing Hyzaar.      Relevant Medications   metoprolol succinate (TOPROL XL) 25 MG 24 hr tablet      Current medicines are reviewed at length with the patient today.  (+/- concerns) n/a The following changes have been made:  n/a  Patient Instructions  MEDICATION INSTRUCTIONS  STOP TAKING NORVASC  DECREASE METOPROLOL SUCCINATE ( TOPROL XL ) 25 MG ONE TABLE DAILY     Your physician wants you to follow-up in 6 MONTH WITH DR HARDING.You will receive a reminder letter in the mail two months in advance. If you don't receive a letter, please call our office to schedule the follow-up appointment.    If you need a refill on your cardiac medications before your next appointment, please call your pharmacy.    Studies Ordered:   No orders of the defined types were placed in this encounter.     Bryan Lemma, M.D., M.S. Interventional Cardiologist   Pager # [phone] Phone # [phone] [address] [place], [place] [address]   Thank you for choosing [facility] at [facility]!!

## 2017-07-11 NOTE — Patient Instructions (Addendum)
MEDICATION INSTRUCTIONS  STOP TAKING NORVASC  DECREASE METOPROLOL SUCCINATE ( TOPROL XL ) 25 MG ONE TABLE DAILY     Your physician wants you to follow-up in 6 MONTH WITH DR HARDING.You will receive a reminder letter in the mail two months in advance. If you don't receive a letter, please call our office to schedule the follow-up appointment.    If you need a refill on your cardiac medications before your next appointment, please call your pharmacy.

## 2017-07-11 NOTE — Telephone Encounter (Signed)
Returned call to Stew-advised metoprolol succinate was decreased to 25 mg daily.

## 2017-07-11 NOTE — Telephone Encounter (Signed)
New Message   Pt c/o medication issue:  1. Name of Medication: metoprolol succinate (TOPROL XL) 25 MG 24 hr tablet  2. How are you currently taking this medication (dosage and times per day)? Take 1 tablet (25 mg total) by mouth daily  3. Are you having a reaction (difficulty breathing--STAT)? no  4. What is your medication issue? The pharmacist states he is needing Clarification. Please call

## 2017-07-14 NOTE — Assessment & Plan Note (Signed)
Good news that her Myoview was negative for ischemia, that does not mean that she does not have cardia vascular disease.  Her coronary calcium score was high and therefore would recommend LDL closer to 7000.  Her most recent labs were from 2017.  We discussed this in six-month follow-up.  I am not sure with her PCP rechecking labs.  Low threshold for statin

## 2017-07-14 NOTE — Assessment & Plan Note (Signed)
Replacing amlodipine with Toprol and continuing Hyzaar.

## 2017-07-14 NOTE — Assessment & Plan Note (Signed)
Continue to follow with home monitor - may need PRN short acting Beta blocker if Toprol is not fully effective.

## 2017-07-14 NOTE — Assessment & Plan Note (Signed)
Interestingly, her monitor shows more PACs than PVCs seen often in bi or trigeminy. - .  She in the past and had a history of PVCs.  Plan - D/c amlodipine - take Toprol 25 mg @ night

## 2017-12-02 NOTE — Progress Notes (Signed)
Office Visit Note  Patient: Kiara Miller             Date of Birth: [birth year]           MRN: [id number]             PCP: Joette Catching, MD Referring: Joette Catching, MD Visit Date: 12/11/2017 Occupation: @GUAROCC @  Subjective:  Pain in hands.    History of Present Illness: Kiara Miller is a 67 y.o. 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  She has been off Orencia infusions since September 2018.  She initially felt that the symptoms were coming mostly from osteoarthritic joints including her knee joints and ankle joints.  As she was doing well the [place] was not resumed.  She states recently she has been having increased pain and discomfort in her hands.  She describes pain over her MCP joints and PIPs of her hands.  She is having difficulty with grip strength.  She also feel instability in her right second MCP joint.  She continues to have some discomfort in her knee joints and ankle joints.  She is not having much discomfort in her cervical or lumbar region.  She continues to have coccygeal discomfort.  She has been taking Advil for pain management.   Activities of Daily Living:  Patient reports morning stiffness for 45 minutes.   Patient Reports nocturnal pain.  Difficulty dressing/grooming: Denies Difficulty climbing stairs: Reports Difficulty getting out of chair: Reports Difficulty using hands for taps, buttons, cutlery, and/or writing: Reports  Review of Systems  Constitutional: Positive for fatigue. Negative for night sweats, weight gain and weight loss.  HENT: Negative for mouth sores, trouble swallowing, trouble swallowing, mouth dryness and nose dryness.   Eyes: Negative for pain, redness, visual disturbance and dryness.  Respiratory: Negative for cough, shortness of breath and difficulty breathing.   Cardiovascular: Negative for chest pain, palpitations, hypertension, irregular heartbeat and swelling in legs/feet.    Gastrointestinal: Negative for blood in stool, constipation and diarrhea.  Endocrine: Negative for increased urination.  Genitourinary: Negative for vaginal dryness.  Musculoskeletal: Positive for arthralgias, joint pain, joint swelling and morning stiffness. Negative for myalgias, muscle weakness, muscle tenderness and myalgias.  Skin: Negative for color change, rash, hair loss, skin tightness, ulcers and sensitivity to sunlight.  Allergic/Immunologic: Negative for susceptible to infections.  Neurological: Negative for dizziness, memory loss, night sweats and weakness.  Hematological: Negative for swollen glands.  Psychiatric/Behavioral: Negative for depressed mood and sleep disturbance. The patient is not nervous/anxious.     PMFS History:  Patient Active Problem List   Diagnosis Date Noted  . PAC (premature atrial contraction) 07/11/2017  . History of hypothyroidism 06/11/2017  . Essential hypertension 06/11/2017  . History of obesity 06/11/2017  . S/P right knee arthroscopy 06/11/2017  . DDD (degenerative disc disease), cervical 06/11/2017  . DDD (degenerative disc disease), lumbar 06/11/2017  . History of carpal tunnel surgery of right wrist 06/11/2017  . Primary osteoarthritis of both knees 06/11/2017  . Chest pain, precordial 05/05/2017  . PVC's (premature ventricular contractions) 05/03/2017  . Family history of premature CAD 05/03/2017  . 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 Uveitis 10/10/2015  . Diverticulitis 10/10/2015  . Dyspnea 12/05/2010  . Palpitations 12/05/2010    Past Medical History:  Diagnosis Date  . Diverticulitis April 2017  . Hyperlipidemia   . Hypertension   . Joint pain   . Polyarthralgia   .  Right knee pain     Family History  Problem Relation Age of Onset  . Hypertension Father   . Lung disease Brother    Past Surgical History:  Procedure Laterality Date  . ABDOMINAL HYSTERECTOMY    .  CARPAL TUNNEL RELEASE    . KNEE ARTHROSCOPY    . MANDIBLE SURGERY    . NM MYOVIEW LTD  12/2010   Normal nuclear stress test.  No ischemia or infarction.  EF overestimated> 70%  . OSTEOTOMY PELVIS BILATERAL    . TONSILLECTOMY    . TRANSTHORACIC ECHOCARDIOGRAM  12/2010   EF 65-70%. Mild LVH. No RWMA/ Gr1 DD.  . TUBAL LIGATION    . WRIST FUSION     Social History   Social History Narrative  . Not on file    Objective: Vital Signs: BP 133/73 (BP Location: Left Arm, Patient Position: Sitting, Cuff Size: Small)   Pulse 61   Resp 12   Ht 5\' 1"  (1.549 m)   Wt 164 lb 3.2 oz (74.5 kg)   BMI 31.03 kg/m    Physical Exam  Constitutional: She is oriented to person, place, and time. She appears well-developed and well-nourished.  HENT:  Head: Normocephalic and atraumatic.  Eyes: Conjunctivae and EOM are normal.  Neck: Normal range of motion.  Cardiovascular: Normal rate, regular rhythm, normal heart sounds and intact distal pulses.  Pulmonary/Chest: Effort normal and breath sounds normal.  Abdominal: Soft. Bowel sounds are normal.  Lymphadenopathy:    She has no cervical adenopathy.  Neurological: She is alert and oriented to person, place, and time.  Skin: Skin is warm and dry. Capillary refill takes less than 2 seconds.  Psychiatric: She has a normal mood and affect. Her behavior is normal.  Nursing note and vitals reviewed.    Musculoskeletal Exam: C-spine thoracic lumbar spine limited range of motion without much discomfort.  Shoulder joints elbow joints were in good range of motion.  She has left wrist joint which is fused.  She had tenderness on palpation over her left second and first MCP joint without much synovitis.  She has DIP and PIP thickening in her hands consistent with osteoarthritis.  She is crepitus in her knee joints without any warmth swelling or effusion.  She has some DIP and PIP thickening in her feet and MTP thickening without any synovitis.  CDAI Exam: CDAI  Score: 2.8  Patient Global Assessment: 7 (mm); Provider Global Assessment: 1 (mm) Swollen: 0 ; Tender: 2  Joint Exam      Right  Left  MCP 1   Tender     MCP 2   Tender        Investigation: No additional findings.  Imaging: Extrem Up Bilat Comp  Result Date: 12/11/2017 Ultrasound examination of bilateral hands was performed per EULAR recommendations. Using 12 MHz transducer, grayscale and power Doppler bilateral second, third, and fifth MCP joints and bilateral wrist joints both dorsal and volar aspects were evaluated to look for synovitis or tenosynovitis. The findings were there was  synovitis on examination of right third MCP joint and bilateral wrist joints.  Tenosynovitis was noted in bilateral wrist joints on ultrasound examination. Right median nerve was 0.11 cm squares which was within normal limits and left median nerve was 0.17 cm squares which was greater than upper limits of normal. Impression: Ultrasound examination revealed synovitis in bilateral wrist joints and right second MCP joint.  Left median nerve is enlarged but patient is not having any  symptoms of couple tunnel syndrome.   Recent Labs: Lab Results  Component Value Date   WBC 7.4 10/25/2016   HGB 16.1 (H) 10/25/2016   PLT 188 10/25/2016   NA 136 10/25/2016   K 3.9 10/25/2016   CL 99 (L) 10/25/2016   CO2 25 10/25/2016   GLUCOSE 81 10/25/2016   BUN 25 (H) 10/25/2016   CREATININE 0.67 10/25/2016   BILITOT 0.7 10/25/2016   ALKPHOS 52 10/25/2016   AST 23 10/25/2016   ALT 22 10/25/2016   PROT 7.5 10/25/2016   ALBUMIN 4.1 10/25/2016   CALCIUM 9.7 10/25/2016   GFRAA >60 10/25/2016   QFTBGOLD Negative 08/30/2016    Speciality Comments: Orencia IV 750mg  every 4 weeks, TB gold negative 08/30/16  Procedures:  No procedures performed Allergies: Clindamycin/lincomycin; Codeine; Leflunomide; Levofloxacin; Penicillins; Statins; and Tetracyclines & related   Assessment / Plan:     Visit Diagnoses:  Rheumatoid arthritis of multiple sites without rheumatoid factor (HCC) - 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  Patient has been experiencing increased joint pain and discomfort.  She has not had any recurrence of uveitis or iritis.  She has been off Orencia since September 2018.  She complains of increased discomfort in her right hand first and second MCP joints.  I will perform an ultrasound of her hands today.  The ultrasound of her hand showed some synovitis and tenosynovitis.  I had detailed discussion regarding starting a DMARD or biologic therapy.  After reviewing indications side effects contraindications she decided to proceed with Plaquenil.  She wants a clearance from her ophthalmologist and she will call October 2018 back to start on the medication.  Her dose will be 200 mg p.o. twice daily Monday through Friday.  Once she starts the medication we will check labs in a month and then every 3 months to monitor for drug toxicity.  I handout on Plaquenil was given.  Side effects were reviewed and consent was taken.  She will also need baseline eye examination and then eye examination on yearly basis.  Pain in both hands-ultrasound examination was performed of bilateral hands today.  The ultrasound revealed synovitis and tenosynovitis on examination.  High risk medication use - She discontinued Orencia IV.  Last infusion was in September 2018 (patient tried sulfasalazine-nausea,, methotrexate-fatigue, Arava-epigastric pain, inadequate response to Humira 2015, Enbrel 2016, Orencia was a started in November 2016 and had a good response to it.)  Primary osteoarthritis of both knees-she has some chronic discomfort in her knee joints due to underlying osteoarthritis.  S/P right knee arthroscopy  DDD (degenerative disc disease), cervical-currently not having much discomfort.  DDD (degenerative disc disease), lumbar-she has discomfort off and on.  Coccyx pain -patient states that MRI was  unremarkable.  She continues to have some coccyx pain.  She has been using cushion.  Status post fusion of wrist (left)  History of carpal tunnel surgery of right wrist  Smoker  History of diverticulitis  History of hypothyroidism  History of hypertension   Orders: Orders Placed This Encounter  Procedures  . December 2016 Extrem Up Bilat Comp  . CBC with Differential/Platelet  . COMPLETE METABOLIC PANEL WITH GFR   No orders of the defined types were placed in this encounter.   Face-to-face time spent with patient was 30 minutes. Greater than 50% of time was spent in counseling and coordination of care.  Follow-Up Instructions: Return in about 3 months (around 03/13/2018) for Rheumatoid arthritis, Osteoarthritis.   05/12/2018, MD  Note -  This record has been created using [employer].  Chart creation errors have been sought, but may not always  have been located. Such creation errors do not reflect on  the standard of medical care.

## 2017-12-11 ENCOUNTER — Encounter: Payer: Self-pay | Admitting: Physician Assistant

## 2017-12-11 ENCOUNTER — Ambulatory Visit (INDEPENDENT_AMBULATORY_CARE_PROVIDER_SITE_OTHER): Payer: Medicare Other | Admitting: Rheumatology

## 2017-12-11 ENCOUNTER — Ambulatory Visit (INDEPENDENT_AMBULATORY_CARE_PROVIDER_SITE_OTHER): Payer: Self-pay

## 2017-12-11 VITALS — BP 133/73 | HR 61 | Resp 12 | Ht 61.0 in | Wt 164.2 lb

## 2017-12-11 DIAGNOSIS — Z8679 Personal history of other diseases of the circulatory system: Secondary | ICD-10-CM

## 2017-12-11 DIAGNOSIS — M79642 Pain in left hand: Secondary | ICD-10-CM

## 2017-12-11 DIAGNOSIS — F17200 Nicotine dependence, unspecified, uncomplicated: Secondary | ICD-10-CM

## 2017-12-11 DIAGNOSIS — M5030 Other cervical disc degeneration, unspecified cervical region: Secondary | ICD-10-CM

## 2017-12-11 DIAGNOSIS — Z8719 Personal history of other diseases of the digestive system: Secondary | ICD-10-CM

## 2017-12-11 DIAGNOSIS — Z79899 Other long term (current) drug therapy: Secondary | ICD-10-CM

## 2017-12-11 DIAGNOSIS — Z981 Arthrodesis status: Secondary | ICD-10-CM

## 2017-12-11 DIAGNOSIS — M170 Bilateral primary osteoarthritis of knee: Secondary | ICD-10-CM | POA: Diagnosis not present

## 2017-12-11 DIAGNOSIS — M79641 Pain in right hand: Secondary | ICD-10-CM | POA: Diagnosis not present

## 2017-12-11 DIAGNOSIS — M0609 Rheumatoid arthritis without rheumatoid factor, multiple sites: Secondary | ICD-10-CM

## 2017-12-11 DIAGNOSIS — Z9889 Other specified postprocedural states: Secondary | ICD-10-CM

## 2017-12-11 DIAGNOSIS — Z8639 Personal history of other endocrine, nutritional and metabolic disease: Secondary | ICD-10-CM

## 2017-12-11 DIAGNOSIS — M533 Sacrococcygeal disorders, not elsewhere classified: Secondary | ICD-10-CM

## 2017-12-11 DIAGNOSIS — M5136 Other intervertebral disc degeneration, lumbar region: Secondary | ICD-10-CM

## 2017-12-11 NOTE — Patient Instructions (Addendum)
Standing Labs We placed an order today for your standing lab work.    Please come back and get your standing labs in 1 month and then every 3 months.   We have open lab Monday through Friday from 8:30-11:30 AM and 1:30-4:00 PM  at the office of Dr. Pollyann Savoy.   You may experience shorter wait times on Monday and Friday afternoons. The office is located at [address], [place], [place] [address] No appointment is necessary.   Labs are drawn by [facility] [employer].  You may receive a bill from [facility] for your lab work. If you have any questions regarding directions or hours of operation,  please call [phone].   Just as a reminder please drink plenty of water prior to coming for your lab work. Thanks! Hydroxychloroquine tablets What is this medicine? HYDROXYCHLOROQUINE (hye drox ee KLOR oh kwin) is used to treat rheumatoid arthritis and systemic lupus erythematosus. It is also used to treat malaria. This medicine may be used for other purposes; ask your health care provider or pharmacist if you have questions. COMMON BRAND NAME(S): Plaquenil, Quineprox What should I tell my health care provider before I take this medicine? They need to know if you have any of these conditions: -diabetes -eye disease, vision problems -G6PD deficiency -history of blood diseases -history of irregular heartbeat -if you often drink alcohol -kidney disease -liver disease -porphyria -psoriasis -seizures -an unusual or allergic reaction to chloroquine, hydroxychloroquine, other medicines, foods, dyes, or preservatives -pregnant or trying to get pregnant -breast-feeding How should I use this medicine? Take this medicine by mouth with a glass of water. Follow the directions on the prescription label. Avoid taking antacids within 4 hours of taking this medicine. It is best to separate these medicines by at least 4 hours. Do not cut, crush or chew this medicine. You can take it with or without  food. If it upsets your stomach, take it with food. Take your medicine at regular intervals. Do not take your medicine more often than directed. Take all of your medicine as directed even if you think you are better. Do not skip doses or stop your medicine early. Talk to your pediatrician regarding the use of this medicine in children. While this drug may be prescribed for selected conditions, precautions do apply. Overdosage: If you think you have taken too much of this medicine contact a poison control center or emergency room at once. NOTE: This medicine is only for you. Do not share this medicine with others. What if I miss a dose? If you miss a dose, take it as soon as you can. If it is almost time for your next dose, take only that dose. Do not take double or extra doses. What may interact with this medicine? Do not take this medicine with any of the following medications: -cisapride -dofetilide -dronedarone -live virus vaccines -penicillamine -pimozide -thioridazine -ziprasidone This medicine may also interact with the following medications: -ampicillin -antacids -cimetidine -cyclosporine -digoxin -medicines for diabetes, like insulin, glipizide, glyburide -medicines for seizures like carbamazepine, phenobarbital, phenytoin -mefloquine -methotrexate -other medicines that prolong the QT interval (cause an abnormal heart rhythm) -praziquantel This list may not describe all possible interactions. Give your health care provider a list of all the medicines, herbs, non-prescription drugs, or dietary supplements you use. Also tell them if you smoke, drink alcohol, or use illegal drugs. Some items may interact with your medicine. What should I watch for while using this medicine? Tell your doctor or healthcare  professional if your symptoms do not start to get better or if they get worse. Avoid taking antacids within 4 hours of taking this medicine. It is best to separate these medicines  by at least 4 hours. Tell your doctor or health care professional right away if you have any change in your eyesight. Your vision and blood may be tested before and during use of this medicine. This medicine can make you more sensitive to the sun. Keep out of the sun. If you cannot avoid being in the sun, wear protective clothing and use sunscreen. Do not use sun lamps or tanning beds/booths. What side effects may I notice from receiving this medicine? Side effects that you should report to your doctor or health care professional as soon as possible: -allergic reactions like skin rash, itching or hives, swelling of the face, lips, or tongue -changes in vision -decreased hearing or ringing of the ears -redness, blistering, peeling or loosening of the skin, including inside the mouth -seizures -sensitivity to light -signs and symptoms of a dangerous change in heartbeat or heart rhythm like chest pain; dizziness; fast or irregular heartbeat; palpitations; feeling faint or lightheaded, falls; breathing problems -signs and symptoms of liver injury like dark yellow or brown urine; general ill feeling or flu-like symptoms; light-colored stools; loss of appetite; nausea; right upper belly pain; unusually weak or tired; yellowing of the eyes or skin -signs and symptoms of low blood sugar such as feeling anxious; confusion; dizziness; increased hunger; unusually weak or tired; sweating; shakiness; cold; irritable; headache; blurred vision; fast heartbeat; loss of consciousness -uncontrollable head, mouth, neck, arm, or leg movements Side effects that usually do not require medical attention (report to your doctor or health care professional if they continue or are bothersome): -anxious -diarrhea -dizziness -hair loss -headache -irritable -loss of appetite -nausea, vomiting -stomach pain This list may not describe all possible side effects. Call your doctor for medical advice about side effects. You  may report side effects to FDA at 1-800-FDA-1088. Where should I keep my medicine? Keep out of the reach of children. In children, this medicine can cause overdose with small doses. Store at room temperature between 15 and 30 degrees C (59 and 86 degrees F). Protect from moisture and light. Throw away any unused medicine after the expiration date. NOTE: This sheet is a summary. It may not cover all possible information. If you have questions about this medicine, talk to your doctor, pharmacist, or health care provider.  2018 Elsevier/Gold Standard (2015-09-07 14:16:15)

## 2017-12-11 NOTE — Progress Notes (Signed)
Pharmacy Note  Subjective:  Patient presents today to the [facility] to see Dr. Corliss Skains.  Patient seen by the pharmacist for counseling on medication options for rheumatoid arthritis.  Previous medications include: sulfasalazine-nausea,, methotrexate-fatigue, Arava-epigastric pain, inadequate response to Humira 2015, Enbrel 2016, Orencia IV (last infusion 2018).  Objective:  Baseline Immunosuppressant Therapy Labs  TB gold negative 08/30/16 Hepatitis panel negative 04/01/12 G6PD negative 04/01/12 Immunoglobulins normal 08/28/13 HIV negative 08/28/13  Chest X-ray  Normal 05/01/17   Last CBC/ CMP within normal limits 11/01/17.  Assessment/Plan: Discussed different medication options such as Plaquenil, methotrexate and continuing Orenica.  Patient is interested in Plaquenil.  Patient was counseled on the purpose, proper use, and adverse effects of hydroxychloroquine including nausea/diarrhea, skin rash, headaches, and sun sensitivity.  Discussed importance of annual eye exams while on hydroxychloroquine to monitor to ocular toxicity and discussed importance of frequent laboratory monitoring.  Provided patient with eye exam form for baseline ophthalmologic exam.  Provided patient with educational materials on hydroxychloroquine and answered all questions.  Patient consented to hydroxychloroquine.  Will upload consent in the media tab.    Patient would like to follow up with opthalmologist Dr.Dunn prior to starting medication.  Patient will call if he clears her to start the medication.   All questions encouraged and answered.  Verlin Fester, PharmD, [facility] Clinical Pharmacist  12/11/2017 2:31 PM

## 2017-12-18 ENCOUNTER — Encounter: Payer: Self-pay | Admitting: Rheumatology

## 2018-01-07 ENCOUNTER — Ambulatory Visit (INDEPENDENT_AMBULATORY_CARE_PROVIDER_SITE_OTHER): Payer: Medicare Other | Admitting: Cardiology

## 2018-01-07 ENCOUNTER — Encounter: Payer: Self-pay | Admitting: Cardiology

## 2018-01-07 VITALS — BP 114/65 | HR 60 | Ht 61.0 in | Wt 164.8 lb

## 2018-01-07 DIAGNOSIS — Z8249 Family history of ischemic heart disease and other diseases of the circulatory system: Secondary | ICD-10-CM | POA: Diagnosis not present

## 2018-01-07 DIAGNOSIS — R002 Palpitations: Secondary | ICD-10-CM | POA: Diagnosis not present

## 2018-01-07 DIAGNOSIS — I10 Essential (primary) hypertension: Secondary | ICD-10-CM | POA: Diagnosis not present

## 2018-01-07 DIAGNOSIS — I493 Ventricular premature depolarization: Secondary | ICD-10-CM

## 2018-01-07 NOTE — Patient Instructions (Signed)
Medication Instructions:  NOT NEEDED  If you need a refill on your cardiac medications before your next appointment, please call your pharmacy.   Lab work: *NOT NEEDE If you have labs (blood work) drawn today and your tests are completely normal, you will receive your results only by: Marland Kitchen MyChart Message (if you have MyChart) OR . A paper copy in the mail If you have any lab test that is abnormal or we need to change your treatment, we will call you to review the results.  Testing/Procedures: *NOT NEEDED  Follow-Up: At [facility], you and your health needs are our priority.  As part of our continuing mission to provide you with exceptional heart care, we have created designated Provider Care Teams.  These Care Teams include your primary Cardiologist (physician) and Advanced Practice Providers (APPs -  Physician Assistants and Nurse Practitioners) who all work together to provide you with the care you need, when you need it. You will need a follow up appointment in 12 months.  Please call our office 2 months in advance to schedule this appointment.  You may see No primary care provider on file. or one of the following Advanced Practice Providers on your designated Care Team:   Theodore Demark, PA-C . Joni Reining, DNP, ANP  Any Other Special Instructions Will Be Listed Below (If Applicable). NOT NEEDED

## 2018-01-07 NOTE — Progress Notes (Signed)
PCP: Joette Catching, MD  Clinic Note: Chief Complaint  Patient presents with  . Follow-up    Doing well.  . Palpitations    No longer present with beta-blocker.    HPI: Kiara Miller is a 67 y.o. female who is being seen today for the evaluation of Chest Pain & Irregular Heart Beat at the request of Joette Catching, MD. Cardiac risk factors include hypertension, hyperlipidemia and family history of premature CAD (her father died from heart attack at age 21, and paternal grandmother had an aortic aneurysm).   Cardiology Evaluation in 2012 (Dr. Elease Hashimoto) --> evaluated with an echocardiogram and Lexiscan Myoview (see PSH)  Initially seen by me back on May 03, 2017 for off-and-on episodes of skipping heartbeats and chest discomfort. She was evaluated with a coronary calcium score which was somewhat elevated and therefore followed up with a Myoview stress test.  She herself purchased a portable home cardiac monitor (EMAY) showing PACs, PVCs with some trigeminy.Kiara Miller was seen on July 11, 2017.  Was doing much better.  Recent Hospitalizations: None  Studies Personally Reviewed - (if available, images/films reviewed: From Epic Chart or Care Everywhere)  None  Interval History: Kiara Miller is now saying that she is really having no more palpitations ever since we went up on her beta-blocker dose.  Palpitations are all but gone and her only complaint now is rheumatoid arthritis pains.  Has not had any lightheadedness or dizziness.  No syncope or near syncope.  No TIA or amaurosis fugax.  No chest pain or tightness with rest or exertion.  No PND, orthopnea or edema. Very stable from cardiac standpoint.  ROS: A comprehensive was performed. Review of Systems  Constitutional: Negative for malaise/fatigue.  Respiratory: Negative for cough, shortness of breath (None since her cold resolved ) and wheezing.   Cardiovascular:       Per HPI  Gastrointestinal: Positive for heartburn.  Negative for blood in stool, constipation and melena.  Genitourinary: Negative for hematuria.  Musculoskeletal: Positive for joint pain.   I have reviewed and (if needed) personally updated the patient's problem list, medications, allergies, past medical and surgical history, social and family history.   Past Medical History:  Diagnosis Date  . Diverticulitis April 2017  . Hyperlipidemia   . Hypertension   . Joint pain   . Polyarthralgia   . Right knee pain     Past Surgical History:  Procedure Laterality Date  . ABDOMINAL HYSTERECTOMY    . CARPAL TUNNEL RELEASE    . KNEE ARTHROSCOPY    . MANDIBLE SURGERY    . NM MYOVIEW LTD  06/2017   (Ordered for coronary calcium score 376) LOW RISK.  EF 72%.  No ischemia or infarction.  No wall motion normality.  No change from previous.  . OSTEOTOMY PELVIS BILATERAL    . TONSILLECTOMY    . TRANSTHORACIC ECHOCARDIOGRAM  12/2010   EF 65-70%. Mild LVH. No RWMA/ Gr1 DD.  . TUBAL LIGATION    . WRIST FUSION      Current Meds  Medication Sig  . aspirin 81 MG tablet Take 81 mg by mouth every other day.   . ketoconazole (NIZORAL) 2 % cream as needed.  Marland Kitchen levothyroxine (SYNTHROID, LEVOTHROID) 75 MCG tablet Take 75 mcg by mouth daily before breakfast.  . losartan-hydrochlorothiazide (HYZAAR) 100-25 MG per tablet Take 1 tablet by mouth daily.  . metoprolol succinate (TOPROL XL) 25 MG 24 hr tablet Take 1 tablet (25 mg  total) by mouth daily.  . montelukast (SINGULAIR) 10 MG tablet Take 10 mg by mouth at bedtime.    . Omega-3 Fatty Acids (FISH OIL) 1200 MG CAPS Take 1,200 mg by mouth 2 (two) times daily.    . Vitamin D, Ergocalciferol, (DRISDOL) 50000 UNITS CAPS capsule Take 50,000 Units by mouth every 7 (seven) days. Takes on Wednesdays.    Allergies  Allergen Reactions  . Clindamycin/Lincomycin Other (See Comments)    Severe esophagitis  . Codeine   . Leflunomide   . Levofloxacin   . Penicillins   . Statins   . Tetracyclines & Related      Social History   Tobacco Use  . Smoking status: Current Every Day Smoker    Packs/day: 0.50    Types: Cigarettes  . Smokeless tobacco: Never Used  Substance Use Topics  . Alcohol use: No  . Drug use: No   Social History   Social History Narrative  . Not on file    family history includes Hypertension in her father; Lung disease in her brother.  Wt Readings from Last 3 Encounters:  01/07/18 164 lb 12.8 oz (74.8 kg)  12/11/17 164 lb 3.2 oz (74.5 kg)  07/11/17 157 lb (71.2 kg)    PHYSICAL EXAM BP 114/65   Pulse 60   Ht 5\' 1"  (1.549 m)   Wt 164 lb 12.8 oz (74.8 kg)   SpO2 96%   BMI 31.14 kg/m  Physical Exam  Constitutional: She is oriented to person, place, and time. She appears well-nourished. No distress.  Healthy-appearing.  Well- groomed  HENT:  Head: Normocephalic and atraumatic.  Neck: Normal range of motion. Neck supple. No hepatojugular reflux and no JVD present. Carotid bruit is not present.  Cardiovascular: Normal rate, regular rhythm, normal heart sounds, intact distal pulses and normal pulses.  No extrasystoles are present. PMI is not displaced. Exam reveals no gallop and no friction rub.  No murmur heard. Pulmonary/Chest: Effort normal and breath sounds normal. No respiratory distress. She has no wheezes. She has no rales (Or rhonchi).  Abdominal: Soft. Bowel sounds are normal. She exhibits no distension. There is no tenderness. There is no rebound.  Musculoskeletal: Normal range of motion. She exhibits no edema.  Mild RA swelling in MIP and PIP joints.  Neurological: She is alert and oriented to person, place, and time.  Psychiatric: She has a normal mood and affect. Her behavior is normal. Judgment and thought content normal.  Nursing note and vitals reviewed.    Adult ECG Report None  Other studies Reviewed: Additional studies/ records that were reviewed today include:  Recent Labs: No new labs No results found for: CHOL, HDL, LDLCALC,  LDLDIRECT, TRIG, CHOLHDL Lab Results  Component Value Date   CREATININE 0.67 10/25/2016   BUN 25 (H) 10/25/2016   NA 136 10/25/2016   K 3.9 10/25/2016   CL 99 (L) 10/25/2016   CO2 25 10/25/2016   Lab Results  Component Value Date   WBC 7.4 10/25/2016   HGB 16.1 (H) 10/25/2016   HCT 46.5 (H) 10/25/2016   MCV 90.1 10/25/2016   PLT 188 10/25/2016    1 ASSESSMENT / PLAN: Problem List Items Addressed This Visit    Essential hypertension (Chronic)    Stable on current dose of Toprol along with Hyzaar.      Family history of premature CAD (Chronic)    Elevated coronary calcium score, but nonischemic stress test.  With target LDL closer to 70.  Is  on fish oil currently.  I do not have recent lipids, but would recommend treating if not close to goal.      Palpitations - Primary (Chronic)    Controlled with beta-blocker.      PVC's (premature ventricular contractions) (Chronic)    Well-controlled with beta-blocker.  Burden does not seem to be very high.  Relatively normal ischemic evaluation         Current medicines are reviewed at length with the patient today.  (+/- concerns) n/a The following changes have been made:  n/a  Patient Instructions  Medication Instructions:  NOT NEEDED  If you need a refill on your cardiac medications before your next appointment, please call your pharmacy.   Lab work: *NOT NEEDE If you have labs (blood work) drawn today and your tests are completely normal, you will receive your results only by: Marland Kitchen MyChart Message (if you have MyChart) OR . A paper copy in the mail If you have any lab test that is abnormal or we need to change your treatment, we will call you to review the results.  Testing/Procedures: *NOT NEEDED  Follow-Up: At [facility], you and your health needs are our priority.  As part of our continuing mission to provide you with exceptional heart care, we have created designated Provider Care Teams.  These Care Teams  include your primary Cardiologist (physician) and Advanced Practice Providers (APPs -  Physician Assistants and Nurse Practitioners) who all work together to provide you with the care you need, when you need it. You will need a follow up appointment in 12 months.  Please call our office 2 months in advance to schedule this appointment.  You may see No primary care provider on file. or one of the following Advanced Practice Providers on your designated Care Team:   Theodore Demark, PA-C . Joni Reining, DNP, ANP  Any Other Special Instructions Will Be Listed Below (If Applicable). NOT NEEDED   She indicated that she would like to follow-up in 1 year.  Studies Ordered:   No orders of the defined types were placed in this encounter.     Bryan Lemma, M.D., M.S. Interventional Cardiologist   Pager # [phone] Phone # [phone] [address] [place], [place] [address]   Thank you for choosing Heartcare at [facility]!!

## 2018-01-09 ENCOUNTER — Encounter: Payer: Self-pay | Admitting: Cardiology

## 2018-01-09 NOTE — Assessment & Plan Note (Signed)
Stable on current dose of Toprol along with Hyzaar.

## 2018-01-09 NOTE — Assessment & Plan Note (Signed)
Elevated coronary calcium score, but nonischemic stress test.  With target LDL closer to 70.  Is on fish oil currently.  I do not have recent lipids, but would recommend treating if not close to goal.

## 2018-01-09 NOTE — Assessment & Plan Note (Signed)
Controlled with beta blocker  

## 2018-01-09 NOTE — Assessment & Plan Note (Signed)
Well-controlled with beta-blocker.  Burden does not seem to be very high.  Relatively normal ischemic evaluation

## 2018-07-01 ENCOUNTER — Other Ambulatory Visit: Payer: Self-pay | Admitting: Cardiology

## 2018-07-30 ENCOUNTER — Telehealth: Payer: Self-pay | Admitting: Physician Assistant

## 2018-07-30 ENCOUNTER — Telehealth: Payer: Self-pay | Admitting: Cardiology

## 2018-07-30 NOTE — Telephone Encounter (Signed)
   Primary Cardiologist: No primary care provider on file.  Chart reviewed as part of pre-operative protocol coverage. Patient was contacted 07/30/2018 in reference to pre-operative risk assessment for pending surgery as outlined below.  Kiara Miller was last seen on 01/07/2018 by Dr. Ellyn Hack.  Since that day, Kiara Miller has done well without chest pain or shortness of breath. Last stress test in 06/2017 was normal.  Therefore, based on ACC/AHA guidelines, the patient would be at acceptable risk for the planned procedure without further cardiovascular testing.   I will route this recommendation to the requesting party via Epic fax function and remove from pre-op pool.  Please call with questions. She may hold aspirin for 7 days prior to the surgery and restart as soon as possible after the surgery at the discretion of the surgeon.   [place], [place] 07/30/2018, 5:32 PM

## 2018-07-30 NOTE — Telephone Encounter (Signed)
Agree  DH 

## 2018-07-30 NOTE — Telephone Encounter (Signed)
   [facility] Pre-operative Risk Assessment    Request for surgical clearance:  1. What type of surgery is being performed? Total Knee Arthroplasty   2. When is this surgery scheduled? 09/02/18  3. What type of clearance is required (medical clearance vs. Pharmacy clearance to hold med vs. Both)? Both  4. Are there any medications that need to be held prior to surgery and how long? Aspirin for 7 days   5. Practice name and name of physician performing surgery? [facility]; Dr. Paralee Cancel   6. What is your office phone number [phone]    7.   What is your office fax number [phone] ATTN: Orson Slick  8.   Anesthesia type (None, local, MAC, general) ? Spinal   Kiara Miller 07/30/2018, 1:49 PM  _________________________________________________________________   (provider comments below)

## 2018-07-30 NOTE — Telephone Encounter (Signed)
ERROR

## 2018-08-21 NOTE — H&P (Signed)
TOTAL KNEE ADMISSION H&P  Patient is being admitted for right total knee arthroplasty.  Subjective:  Chief Complaint:   Right knee primary OA / pain  HPI: NATASSJA OLLIS, 68 y.o. female, has a history of pain and functional disability in the right knee due to arthritis and has failed non-surgical conservative treatments for greater than 12 weeks to include NSAID's and/or analgesics, corticosteriod injections and activity modification.  Onset of symptoms was gradual, starting >10 years ago with gradually worsening course since that time. The patient noted prior procedures on the knee to include  arthroscopy on the right knee(s).  Patient currently rates pain in the right knee(s) at 8 out of 10 with activity. Patient has night pain, worsening of pain with activity and weight bearing, pain that interferes with activities of daily living, pain with passive range of motion, crepitus and joint swelling.  Patient has evidence of periarticular osteophytes and joint space narrowing by imaging studies.  There is no active infection.  Risks, benefits and expectations were discussed with the patient.  Risks including but not limited to the risk of anesthesia, blood clots, nerve damage, blood vessel damage, failure of the prosthesis, infection and up to and including death.  Patient understand the risks, benefits and expectations and wishes to proceed with surgery.   PCP: Joette Catching, MD  D/C Plans:       Home   Post-op Meds:       No Rx given  Tranexamic Acid:      To be given - IV   Decadron:      Is to be given  FYI:      ASA   DME:   Pt already has equipment  PT:   OPPT    Pharmacy: [facility]    Patient Active Problem List   Diagnosis Date Noted  . PAC (premature atrial contraction) 07/11/2017  . History of hypothyroidism 06/11/2017  . Essential hypertension 06/11/2017  . History of obesity 06/11/2017  . S/P right knee arthroscopy 06/11/2017  . DDD (degenerative disc disease),  cervical 06/11/2017  . DDD (degenerative disc disease), lumbar 06/11/2017  . History of carpal tunnel surgery of right wrist 06/11/2017  . Primary osteoarthritis of both knees 06/11/2017  . Chest pain, precordial 05/05/2017  . PVC's (premature ventricular contractions) 05/03/2017  . Family history of premature CAD 05/03/2017  . 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 Uveitis 10/10/2015  . Diverticulitis 10/10/2015  . Dyspnea 12/05/2010  . Palpitations 12/05/2010   Past Medical History:  Diagnosis Date  . Diverticulitis April 2017  . Hyperlipidemia   . Hypertension   . Joint pain   . Polyarthralgia   . Right knee pain     Past Surgical History:  Procedure Laterality Date  . ABDOMINAL HYSTERECTOMY    . CARPAL TUNNEL RELEASE    . KNEE ARTHROSCOPY    . MANDIBLE SURGERY    . NM MYOVIEW LTD  06/2017   (Ordered for coronary calcium score 376) LOW RISK.  EF 72%.  No ischemia or infarction.  No wall motion normality.  No change from previous.  . OSTEOTOMY PELVIS BILATERAL    . TONSILLECTOMY    . TRANSTHORACIC ECHOCARDIOGRAM  12/2010   EF 65-70%. Mild LVH. No RWMA/ Gr1 DD.  . TUBAL LIGATION    . WRIST FUSION      No current facility-administered medications for this encounter.    Current Outpatient Medications  Medication Sig Dispense Refill  Last Dose  . aspirin 81 MG tablet Take 81 mg by mouth every other day.    Taking  . ketoconazole (NIZORAL) 2 % cream as needed.   Taking  . levothyroxine (SYNTHROID, LEVOTHROID) 75 MCG tablet Take 75 mcg by mouth daily before breakfast.   Taking  . losartan-hydrochlorothiazide (HYZAAR) 100-25 MG per tablet Take 1 tablet by mouth daily.   Taking  . metoprolol succinate (TOPROL-XL) 25 MG 24 hr tablet TAKE 1 TABLET DAILY 90 tablet 0   . montelukast (SINGULAIR) 10 MG tablet Take 10 mg by mouth at bedtime.     Taking  . Omega-3 Fatty Acids (FISH OIL) 1200 MG CAPS Take 1,200 mg by mouth 2 (two) times  daily.     Taking  . Vitamin D, Ergocalciferol, (DRISDOL) 50000 UNITS CAPS capsule Take 50,000 Units by mouth every 7 (seven) days. Takes on Wednesdays.   Taking   Facility-Administered Medications Ordered in Other Encounters  Medication Dose Route Frequency Provider Last Rate Last Dose  . acetaminophen (TYLENOL) tablet 650 mg  650 mg Oral 60 min Pre-Op Deveshwar, Abel Presto, MD      . diphenhydrAMINE (BENADRYL) capsule 25 mg  25 mg Oral 60 min Pre-Op Bo Merino, MD       Allergies  Allergen Reactions  . Clindamycin/Lincomycin Other (See Comments)    Severe esophagitis  . Codeine   . Leflunomide   . Levofloxacin   . Penicillins   . Statins   . Tetracyclines & Related     Social History   Tobacco Use  . Smoking status: Current Every Day Smoker    Packs/day: 0.50    Types: Cigarettes  . Smokeless tobacco: Never Used  Substance Use Topics  . Alcohol use: No    Family History  Problem Relation Age of Onset  . Hypertension Father   . Lung disease Brother      Review of Systems  Constitutional: Negative.   HENT: Negative.   Eyes: Negative.   Respiratory: Negative.   Cardiovascular: Negative.   Gastrointestinal: Negative.   Genitourinary: Negative.   Musculoskeletal: Positive for joint pain.  Skin: Negative.   Neurological: Negative.   Endo/Heme/Allergies: Negative.   Psychiatric/Behavioral: Negative.     Objective:  Physical Exam  Constitutional: She is oriented to person, place, and time. She appears well-developed.  HENT:  Head: Normocephalic.  Eyes: Pupils are equal, round, and reactive to light.  Neck: Neck supple. No JVD present. No tracheal deviation present. No thyromegaly present.  Cardiovascular: Normal rate, regular rhythm and intact distal pulses.  Respiratory: Effort normal and breath sounds normal. No respiratory distress. She has no wheezes.  GI: Soft. There is no abdominal tenderness. There is no guarding.  Musculoskeletal:     Right knee: She  exhibits swelling and bony tenderness. She exhibits no ecchymosis, no deformity, no laceration and no erythema. Tenderness found.  Lymphadenopathy:    She has no cervical adenopathy.  Neurological: She is alert and oriented to person, place, and time.  Skin: Skin is warm and dry.  Psychiatric: She has a normal mood and affect.      Labs:  Estimated body mass index is 31.14 kg/m as calculated from the following:   Height as of 01/07/18: 5\' 1"  (1.549 m).   Weight as of 01/07/18: 74.8 kg.   Imaging Review Plain radiographs demonstrate severe degenerative joint disease of the right knee.  The bone quality appears to be good for age and reported activity level.  Assessment/Plan:  End stage arthritis, right knee   The patient history, physical examination, clinical judgment of the provider and imaging studies are consistent with end stage degenerative joint disease of the right knee and total knee arthroplasty is deemed medically necessary. The treatment options including medical management, injection therapy arthroscopy and arthroplasty were discussed at length. The risks and benefits of total knee arthroplasty were presented and reviewed. The risks due to aseptic loosening, infection, stiffness, patella tracking problems, thromboembolic complications and other imponderables were discussed. The patient acknowledged the explanation, agreed to proceed with the plan and consent was signed. Patient is being admitted for inpatient treatment for surgery, pain control, PT, OT, prophylactic antibiotics, VTE prophylaxis, progressive ambulation and ADL's and discharge planning. The patient is planning to be discharged home.    Patient's anticipated LOS is less than 2 midnights, meeting these requirements: - Lives within 1 hour of care - Has a competent adult at home to recover with post-op recover - NO history of  - Chronic pain requiring opiods  - Diabetes  - Coronary Artery Disease  -  Heart failure  - Heart attack  - Stroke  - DVT/VTE  - Cardiac arrhythmia  - Respiratory Failure/COPD  - Renal failure  - Anemia  - Advanced Liver disease     Anastasio Auerbach. Emmanual Gauthreaux   PA-C  08/21/2018, 8:32 AM

## 2018-08-28 NOTE — Progress Notes (Signed)
LOV [facility] 01-17-18 Epic  CARDIAC CLEARANCE HAO MENG PA 07-30-18 Epic STRESS TEST 06-06-17 Epic

## 2018-08-28 NOTE — Patient Instructions (Addendum)
YOU  HAD  A COVID 19 TEST ON 08-29-2018 .ONCE YOUR COVID TEST IS COMPLETED, PLEASE BEGIN THE QUARANTINE INSTRUCTIONS AS OUTLINED IN YOUR HANDOUT.                Kiara Miller    Your procedure is scheduled on: 09-02-2018   Report to [facility] Main  Entrance    Report to admitting at 7:30 AM    1 VISITOR IS ALLOWED TO WAIT IN WAITING ROOM  ONLY DAY OF YOUR SURGERY.    Call this number if you have problems the morning of surgery [phone]    Remember:    NO SOLID FOOD AFTER MIDNIGHT THE NIGHT PRIOR TO SURGERY. NOTHING BY MOUTH EXCEPT CLEAR LIQUIDS UNTIL 705 AM. PLEASE FINISH ENSURE DRINK PER SURGEON ORDER 3 HOURS PRIOR TO SCHEDULED SURGERY TIME WHICH NEEDS TO BE COMPLETED AT 705 AM.    CLEAR LIQUID DIET   Foods Allowed                                                                     Foods Excluded  Coffee and tea, regular and decaf                             liquids that you cannot  Plain Jell-O any favor except red or purple                                           see through such as: Fruit ices (not with fruit pulp)                                     milk, soups, orange juice  Iced Popsicles                                    All solid food Carbonated beverages, regular and diet                                    Cranberry, grape and apple juices Sports drinks like Gatorade Lightly seasoned clear broth or consume(fat free) Sugar, honey syrup  Sample Menu Breakfast                                Lunch                                     Supper Cranberry juice                    Beef broth                            Chicken broth Jell-O  Grape juice                           Apple juice Coffee or tea                        Jell-O                                      Popsicle                                                Coffee or tea                        Coffee or  tea  _____________________________________________________________________    Take these medicines the morning of surgery with A SIP OF WATER: Levothyroxine (Synthroid)  BRUSH YOUR TEETH MORNING OF SURGERY AND RINSE YOUR MOUTH OUT, NO CHEWING GUM CANDY OR MINTS.                                You may not have any metal on your body including hair pins and              piercings     Do not wear jewelry, make-up, lotions, powders or perfumes, deodorant              Do not wear nail polish.  Do not shave  48 hours prior to surgery.             Do not bring valuables to the hospital. [place].  Contacts, dentures or bridgework may not be worn into surgery.       _____________________________________________________________________             [facility] - Preparing for Surgery Before surgery, you can play an important role.  Because skin is not sterile, your skin needs to be as free of germs as possible.  You can reduce the number of germs on your skin by washing with CHG (chlorahexidine gluconate) soap before surgery.  CHG is an antiseptic cleaner which kills germs and bonds with the skin to continue killing germs even after washing. Please DO NOT use if you have an allergy to CHG or antibacterial soaps.  If your skin becomes reddened/irritated stop using the CHG and inform your nurse when you arrive at Short Stay. Do not shave (including legs and underarms) for at least 48 hours prior to the first CHG shower.  You may shave your face/neck. Please follow these instructions carefully:  1.  Shower with CHG Soap the night before surgery and the  morning of Surgery.  2.  If you choose to wash your hair, wash your hair first as usual with your  normal  shampoo.  3.  After you shampoo, rinse your hair and body thoroughly to remove the  shampoo.                           4.  Use CHG  as you would any other liquid soap.  You can apply chg  directly  to the skin and wash                       Gently with a scrungie or clean washcloth.  5.  Apply the CHG Soap to your body ONLY FROM THE NECK DOWN.   Do not use on face/ open                           Wound or open sores. Avoid contact with eyes, ears mouth and genitals (private parts).                       Wash face,  Genitals (private parts) with your normal soap.             6.  Wash thoroughly, paying special attention to the area where your surgery  will be performed.  7.  Thoroughly rinse your body with warm water from the neck down.  8.  DO NOT shower/wash with your normal soap after using and rinsing off  the CHG Soap.                9.  Pat yourself dry with a clean towel.            10.  Wear clean pajamas.            11.  Place clean sheets on your bed the night of your first shower and do not  sleep with pets. Day of Surgery : Do not apply any lotions/deodorants the morning of surgery.  Please wear clean clothes to the hospital/surgery center.  FAILURE TO FOLLOW THESE INSTRUCTIONS MAY RESULT IN THE CANCELLATION OF YOUR SURGERY PATIENT SIGNATURE_________________________________  NURSE SIGNATURE__________________________________  ________________________________________________________________________   Kiara MireIncentive Spirometer  An incentive spirometer is a tool that can help keep your lungs clear and active. This tool measures how well you are filling your lungs with each breath. Taking long deep breaths may help reverse or decrease the chance of developing breathing (pulmonary) problems (especially infection) following:  A long period of time when you are unable to move or be active. BEFORE THE PROCEDURE   If the spirometer includes an indicator to show your best effort, your nurse or respiratory therapist will set it to a desired goal.  If possible, sit up straight or lean slightly forward. Try not to slouch.  Hold the incentive spirometer in an upright  position. INSTRUCTIONS FOR USE  1. Sit on the edge of your bed if possible, or sit up as far as you can in bed or on a chair. 2. Hold the incentive spirometer in an upright position. 3. Breathe out normally. 4. Place the mouthpiece in your mouth and seal your lips tightly around it. 5. Breathe in slowly and as deeply as possible, raising the piston or the ball toward the top of the column. 6. Hold your breath for 3-5 seconds or for as long as possible. Allow the piston or ball to fall to the bottom of the column. 7. Remove the mouthpiece from your mouth and breathe out normally. 8. Rest for a few seconds and repeat Steps 1 through 7 at least 10 times every 1-2 hours when you are awake. Take your time and take a few normal breaths between deep breaths. 9. The spirometer may include an indicator to show your best effort.  Use the indicator as a goal to work toward during each repetition. 10. After each set of 10 deep breaths, practice coughing to be sure your lungs are clear. If you have an incision (the cut made at the time of surgery), support your incision when coughing by placing a pillow or rolled up towels firmly against it. Once you are able to get out of bed, walk around indoors and cough well. You may stop using the incentive spirometer when instructed by your caregiver.  RISKS AND COMPLICATIONS  Take your time so you do not get dizzy or light-headed.  If you are in pain, you may need to take or ask for pain medication before doing incentive spirometry. It is harder to take a deep breath if you are having pain. AFTER USE  Rest and breathe slowly and easily.  It can be helpful to keep track of a log of your progress. Your caregiver can provide you with a simple table to help with this. If you are using the spirometer at home, follow these instructions: [place] IF:   You are having difficultly using the spirometer.  You have trouble using the spirometer as often as  instructed.  Your pain medication is not giving enough relief while using the spirometer.  You develop fever of 100.5 F (38.1 C) or higher. SEEK IMMEDIATE MEDICAL CARE IF:   You cough up bloody sputum that had not been present before.  You develop fever of 102 F (38.9 C) or greater.  You develop worsening pain at or near the incision site. MAKE SURE YOU:   Understand these instructions.  Will watch your condition.  Will get help right away if you are not doing well or get worse. Document Released: 06/04/2006 Document Revised: 04/16/2011 Document Reviewed: 08/05/2006 Exit[employer] Patient Information 2014 Exit[employer], [place].   ________________________________________________________________________  WHAT IS A BLOOD TRANSFUSION? Blood Transfusion Information  A transfusion is the replacement of blood or some of its parts. Blood is made up of multiple cells which provide different functions.  Red blood cells carry oxygen and are used for blood loss replacement.  White blood cells fight against infection.  Platelets control bleeding.  Plasma helps clot blood.  Other blood products are available for specialized needs, such as hemophilia or other clotting disorders. BEFORE THE TRANSFUSION  Who gives blood for transfusions?   Healthy volunteers who are fully evaluated to make sure their blood is safe. This is blood bank blood. Transfusion therapy is the safest it has ever been in the practice of medicine. Before blood is taken from a donor, a complete history is taken to make sure that person has no history of diseases nor engages in risky social behavior (examples are intravenous drug use or sexual activity with multiple partners). The donor's travel history is screened to minimize risk of transmitting infections, such as malaria. The donated blood is tested for signs of infectious diseases, such as HIV and hepatitis. The blood is then tested to be sure it is compatible with you in  order to minimize the chance of a transfusion reaction. If you or a relative donates blood, this is often done in anticipation of surgery and is not appropriate for emergency situations. It takes many days to process the donated blood. RISKS AND COMPLICATIONS Although transfusion therapy is very safe and saves many lives, the main dangers of transfusion include:   Getting an infectious disease.  Developing a transfusion reaction. This is an allergic reaction to something in the  blood you were given. Every precaution is taken to prevent this. The decision to have a blood transfusion has been considered carefully by your caregiver before blood is given. Blood is not given unless the benefits outweigh the risks. AFTER THE TRANSFUSION  Right after receiving a blood transfusion, you will usually feel much better and more energetic. This is especially true if your red blood cells have gotten low (anemic). The transfusion raises the level of the red blood cells which carry oxygen, and this usually causes an energy increase.  The nurse administering the transfusion will monitor you carefully for complications. HOME CARE INSTRUCTIONS  No special instructions are needed after a transfusion. You may find your energy is better. Speak with your caregiver about any limitations on activity for underlying diseases you may have. SEEK MEDICAL CARE IF:   Your condition is not improving after your transfusion.  You develop redness or irritation at the intravenous (IV) site. SEEK IMMEDIATE MEDICAL CARE IF:  Any of the following symptoms occur over the next 12 hours:  Shaking chills.  You have a temperature by mouth above 102 F (38.9 C), not controlled by medicine.  Chest, back, or muscle pain.  People around you feel you are not acting correctly or are confused.  Shortness of breath or difficulty breathing.  Dizziness and fainting.  You get a rash or develop hives.  You have a decrease in urine  output.  Your urine turns a dark color or changes to pink, red, or brown. Any of the following symptoms occur over the next 10 days:  You have a temperature by mouth above 102 F (38.9 C), not controlled by medicine.  Shortness of breath.  Weakness after normal activity.  The white part of the eye turns yellow (jaundice).  You have a decrease in the amount of urine or are urinating less often.  Your urine turns a dark color or changes to pink, red, or brown. Document Released: 01/20/2000 Document Revised: 04/16/2011 Document Reviewed: 09/08/2007 [facility] Patient Information 2014 [place], [place].  _______________________________________________________________________

## 2018-08-29 ENCOUNTER — Encounter (HOSPITAL_COMMUNITY)
Admission: RE | Admit: 2018-08-29 | Discharge: 2018-08-29 | Disposition: A | Discharge status: Home / Self Care | Payer: Medicare Other | Source: Ambulatory Visit | Attending: Orthopedic Surgery | Admitting: Orthopedic Surgery

## 2018-08-29 ENCOUNTER — Other Ambulatory Visit: Payer: Self-pay

## 2018-08-29 ENCOUNTER — Other Ambulatory Visit (HOSPITAL_COMMUNITY)
Admission: RE | Admit: 2018-08-29 | Discharge: 2018-08-29 | Disposition: A | Discharge status: Home / Self Care | Payer: Medicare Other | Source: Ambulatory Visit | Attending: Orthopedic Surgery | Admitting: Orthopedic Surgery

## 2018-08-29 ENCOUNTER — Encounter (HOSPITAL_COMMUNITY): Payer: Self-pay

## 2018-08-29 DIAGNOSIS — Z79890 Hormone replacement therapy (postmenopausal): Secondary | ICD-10-CM | POA: Insufficient documentation

## 2018-08-29 DIAGNOSIS — R9431 Abnormal electrocardiogram [ECG] [EKG]: Secondary | ICD-10-CM | POA: Diagnosis not present

## 2018-08-29 DIAGNOSIS — I10 Essential (primary) hypertension: Secondary | ICD-10-CM | POA: Diagnosis not present

## 2018-08-29 DIAGNOSIS — Z7982 Long term (current) use of aspirin: Secondary | ICD-10-CM | POA: Diagnosis not present

## 2018-08-29 DIAGNOSIS — Z87891 Personal history of nicotine dependence: Secondary | ICD-10-CM | POA: Diagnosis not present

## 2018-08-29 DIAGNOSIS — M1711 Unilateral primary osteoarthritis, right knee: Secondary | ICD-10-CM | POA: Insufficient documentation

## 2018-08-29 DIAGNOSIS — R001 Bradycardia, unspecified: Secondary | ICD-10-CM | POA: Insufficient documentation

## 2018-08-29 DIAGNOSIS — Z79899 Other long term (current) drug therapy: Secondary | ICD-10-CM | POA: Insufficient documentation

## 2018-08-29 DIAGNOSIS — Z01818 Encounter for other preprocedural examination: Secondary | ICD-10-CM | POA: Diagnosis not present

## 2018-08-29 DIAGNOSIS — E785 Hyperlipidemia, unspecified: Secondary | ICD-10-CM | POA: Insufficient documentation

## 2018-08-29 DIAGNOSIS — Z1159 Encounter for screening for other viral diseases: Secondary | ICD-10-CM | POA: Diagnosis not present

## 2018-08-29 LAB — CBC
HCT: 45.8 % (ref 36.0–46.0)
Hemoglobin: 15.5 g/dL — ABNORMAL HIGH (ref 12.0–15.0)
MCH: 32 pg (ref 26.0–34.0)
MCHC: 33.8 g/dL (ref 30.0–36.0)
MCV: 94.6 fL (ref 80.0–100.0)
Platelets: 163 10*3/uL (ref 150–400)
RBC: 4.84 MIL/uL (ref 3.87–5.11)
RDW: 12.8 % (ref 11.5–15.5)
WBC: 8.4 10*3/uL (ref 4.0–10.5)
nRBC: 0 % (ref 0.0–0.2)

## 2018-08-29 LAB — BASIC METABOLIC PANEL
Anion gap: 9 (ref 5–15)
BUN: 39 mg/dL — ABNORMAL HIGH (ref 8–23)
CO2: 25 mmol/L (ref 22–32)
Calcium: 9.4 mg/dL (ref 8.9–10.3)
Chloride: 103 mmol/L (ref 98–111)
Creatinine, Ser: 0.97 mg/dL (ref 0.44–1.00)
GFR calc Af Amer: >60 mL/min (ref >60)
GFR calc non Af Amer: >60 mL/min (ref >60)
Glucose, Bld: 92 mg/dL (ref 70–99)
Potassium: 4.1 mmol/L (ref 3.5–5.1)
Sodium: 137 mmol/L (ref 135–145)

## 2018-08-29 LAB — SURGICAL PCR SCREEN
MRSA, PCR: NEGATIVE
Staphylococcus aureus: NEGATIVE

## 2018-08-29 LAB — ABO/RH: ABO/RH(D): B NEG

## 2018-08-30 LAB — SARS CORONAVIRUS 2 (TAT 6-24 HRS): SARS Coronavirus 2: NEGATIVE

## 2018-09-01 NOTE — Progress Notes (Signed)
08-29-18 BMP result routed to Dr. Alvan Dame for review

## 2018-09-01 NOTE — Anesthesia Preprocedure Evaluation (Addendum)
Anesthesia Evaluation  Patient identified by MRN, date of birth, ID band Patient awake    Reviewed: Allergy & Precautions, NPO status , Patient's Chart, lab work & pertinent test results, reviewed documented beta blocker date and time   Airway Mallampati: II  TM Distance: >3 FB     Dental  (+) Dental Advisory Given   Pulmonary former smoker,    breath sounds clear to auscultation       Cardiovascular hypertension, Pt. on medications and Pt. on home beta blockers  Rhythm:Regular Rate:Normal     Neuro/Psych negative neurological ROS     GI/Hepatic negative GI ROS, Neg liver ROS,   Endo/Other  negative endocrine ROS  Renal/GU negative Renal ROS     Musculoskeletal  (+) Arthritis ,   Abdominal   Peds  Hematology negative hematology ROS (+)   Anesthesia Other Findings   Reproductive/Obstetrics                            Lab Results  Component Value Date   WBC 8.4 08/29/2018   HGB 15.5 (H) 08/29/2018   HCT 45.8 08/29/2018   MCV 94.6 08/29/2018   PLT 163 08/29/2018   Lab Results  Component Value Date   CREATININE 0.97 08/29/2018   BUN 39 (H) 08/29/2018   NA 137 08/29/2018   K 4.1 08/29/2018   CL 103 08/29/2018   CO2 25 08/29/2018    Anesthesia Physical Anesthesia Plan  ASA: II  Anesthesia Plan: Spinal   Post-op Pain Management:  Regional for Post-op pain   Induction:   PONV Risk Score and Plan: 2 and Propofol infusion, Ondansetron and Treatment may vary due to age or medical condition  Airway Management Planned: Natural Airway and Simple Face Mask  Additional Equipment:   Intra-op Plan:   Post-operative Plan:   Informed Consent: I have reviewed the patients History and Physical, chart, labs and discussed the procedure including the risks, benefits and alternatives for the proposed anesthesia with the patient or authorized representative who has indicated his/her  understanding and acceptance.       Plan Discussed with: CRNA  Anesthesia Plan Comments:        Anesthesia Quick Evaluation

## 2018-09-01 NOTE — Progress Notes (Signed)
Anesthesia Chart Review   Case: [id number] Date/Time: 09/02/18 0950   Procedure: TOTAL KNEE ARTHROPLASTY (Right ) - 70 mins   Anesthesia type: Spinal   Pre-op diagnosis: Right knee osteoarthritis   Location: [facility] ROOM 09 / [facility]   Surgeon: Durene Romans, MD      DISCUSSION:68 y.o. former smoker (24 pack years, quit 08/18/2018) with h/o HTN, HLD, right knee OA scheduled for above procedure 09/02/2018 with Dr. Durene Romans.   Echo 12/14/2010 with normal LV function.  Mild diastolic dysfunction. Stress test 06/06/17 low risk study.   Pt cleared by cardiology 07/30/2018.  Per Azalee Course, PA-C, "FATEMAH POURCIAU was last seen on 01/07/2018 by Dr. Herbie Baltimore.  Since that day, LAREE GARRON has done well without chest pain or shortness of breath. Last stress test in 06/2017 was normal.  Therefore, based on ACC/AHA guidelines, the patient would be at acceptable risk for the planned procedure without further cardiovascular testing. Please call with questions. She may hold aspirin for 7 days prior to the surgery and restart as soon as possible after the surgery at the discretion of the surgeon."  Anticipate pt can proceed with planned procedure barring acute status change.   VS: BP 125/65   Pulse (!) 57   Temp 36.6 C (Oral)   Resp 69  Ht 5' 1.5" (1.562 m)   Wt 76.4 kg   SpO2 97%   BMI 31.32 kg/m   PROVIDERS: Tracey Harries, MD is PCP   Bryan Lemma, MD is Cardiologist  LABS: Labs reviewed: Acceptable for surgery. (all labs ordered are listed, but only abnormal results are displayed)  Labs Reviewed  BASIC METABOLIC PANEL - Abnormal; Notable for the following components:      Result Value   BUN 39 (*)    All other components within normal limits  CBC - Abnormal; Notable for the following components:   Hemoglobin 15.5 (*)    All other components within normal limits  SURGICAL PCR SCREEN  TYPE AND SCREEN  ABO/RH     IMAGES:   EKG: 08/29/2018 Rate 52 bpm Sinus bradycardia Left axis  deviation  Possible anterior infarct, age undetermined  CV: Stress Test 06/06/17  The left ventricular ejection fraction is hyperdynamic (>65%).  Nuclear stress EF: 72%.  There was no ST segment deviation noted during stress.  The study is normal.  This is a low risk study.    Past Medical History:  Diagnosis Date  . Diverticulitis April 2017  . Hyperlipidemia   . Hypertension   . Joint pain   . Polyarthralgia   . Right knee pain     Past Surgical History:  Procedure Laterality Date  . ABDOMINAL HYSTERECTOMY    . CARPAL TUNNEL RELEASE    . KNEE ARTHROSCOPY    . MANDIBLE SURGERY    . NM MYOVIEW LTD  06/2017   (Ordered for coronary calcium score 376) LOW RISK.  EF 72%.  No ischemia or infarction.  No wall motion normality.  No change from previous.  . OSTEOTOMY PELVIS BILATERAL    . TONSILLECTOMY    . TONSILLECTOMY    . TRANSTHORACIC ECHOCARDIOGRAM  12/2010   EF 65-70%. Mild LVH. No RWMA/ Gr1 DD.  . TUBAL LIGATION    . WRIST FUSION      MEDICATIONS: . aspirin 81 MG tablet  . ketoconazole (NIZORAL) 2 % cream  . levothyroxine (SYNTHROID, LEVOTHROID) 75 MCG tablet  . losartan-hydrochlorothiazide (HYZAAR) 100-25 MG per tablet  . metoprolol  succinate (TOPROL-XL) 25 MG 24 hr tablet  . montelukast (SINGULAIR) 10 MG tablet  . NON FORMULARY  . Omega-3 Fatty Acids (FISH OIL) 1200 MG CAPS  . Vitamin D, Ergocalciferol, (DRISDOL) 50000 UNITS CAPS capsule   No current facility-administered medications for this encounter.    Marland Kitchen acetaminophen (TYLENOL) tablet 650 mg  . diphenhydrAMINE (BENADRYL) capsule 25 mg     Maia Plan [facility] Pre-Surgical Testing [phone] 09/01/18  10:47 AM

## 2018-09-02 ENCOUNTER — Other Ambulatory Visit: Payer: Self-pay

## 2018-09-02 ENCOUNTER — Observation Stay (HOSPITAL_COMMUNITY)
Admission: RE | Admit: 2018-09-02 | Discharge: 2018-09-03 | Disposition: A | Discharge status: Home / Self Care | Payer: Medicare Other | Attending: Orthopedic Surgery | Admitting: Orthopedic Surgery

## 2018-09-02 ENCOUNTER — Encounter (HOSPITAL_COMMUNITY)
Admission: RE | Disposition: A | Discharge status: Home / Self Care | Payer: Self-pay | Source: Home / Self Care | Attending: Orthopedic Surgery

## 2018-09-02 ENCOUNTER — Encounter (HOSPITAL_COMMUNITY): Payer: Self-pay | Admitting: *Deleted

## 2018-09-02 ENCOUNTER — Ambulatory Visit (HOSPITAL_COMMUNITY): Payer: Medicare Other | Admitting: Physician Assistant

## 2018-09-02 DIAGNOSIS — I10 Essential (primary) hypertension: Secondary | ICD-10-CM | POA: Diagnosis not present

## 2018-09-02 DIAGNOSIS — M069 Rheumatoid arthritis, unspecified: Secondary | ICD-10-CM | POA: Insufficient documentation

## 2018-09-02 DIAGNOSIS — Z881 Allergy status to other antibiotic agents status: Secondary | ICD-10-CM | POA: Insufficient documentation

## 2018-09-02 DIAGNOSIS — M1711 Unilateral primary osteoarthritis, right knee: Secondary | ICD-10-CM | POA: Diagnosis present

## 2018-09-02 DIAGNOSIS — M948X6 Other specified disorders of cartilage, lower leg: Secondary | ICD-10-CM | POA: Insufficient documentation

## 2018-09-02 DIAGNOSIS — Z888 Allergy status to other drugs, medicaments and biological substances status: Secondary | ICD-10-CM | POA: Diagnosis not present

## 2018-09-02 DIAGNOSIS — Z7982 Long term (current) use of aspirin: Secondary | ICD-10-CM | POA: Insufficient documentation

## 2018-09-02 DIAGNOSIS — Z79890 Hormone replacement therapy (postmenopausal): Secondary | ICD-10-CM | POA: Diagnosis not present

## 2018-09-02 DIAGNOSIS — Z880 Allergy status to penicillin: Secondary | ICD-10-CM | POA: Insufficient documentation

## 2018-09-02 DIAGNOSIS — F17210 Nicotine dependence, cigarettes, uncomplicated: Secondary | ICD-10-CM | POA: Insufficient documentation

## 2018-09-02 DIAGNOSIS — E669 Obesity, unspecified: Secondary | ICD-10-CM | POA: Diagnosis not present

## 2018-09-02 DIAGNOSIS — R262 Difficulty in walking, not elsewhere classified: Secondary | ICD-10-CM | POA: Insufficient documentation

## 2018-09-02 DIAGNOSIS — Z885 Allergy status to narcotic agent status: Secondary | ICD-10-CM | POA: Diagnosis not present

## 2018-09-02 DIAGNOSIS — Z79899 Other long term (current) drug therapy: Secondary | ICD-10-CM | POA: Diagnosis not present

## 2018-09-02 DIAGNOSIS — Z6831 Body mass index (BMI) 31.0-31.9, adult: Secondary | ICD-10-CM | POA: Diagnosis not present

## 2018-09-02 DIAGNOSIS — Z96651 Presence of right artificial knee joint: Secondary | ICD-10-CM

## 2018-09-02 DIAGNOSIS — R2689 Other abnormalities of gait and mobility: Secondary | ICD-10-CM | POA: Diagnosis not present

## 2018-09-02 DIAGNOSIS — E785 Hyperlipidemia, unspecified: Secondary | ICD-10-CM | POA: Insufficient documentation

## 2018-09-02 HISTORY — PX: TOTAL KNEE ARTHROPLASTY: SHX125

## 2018-09-02 LAB — TYPE AND SCREEN
ABO/RH(D): B NEG
Antibody Screen: NEGATIVE

## 2018-09-02 SURGERY — ARTHROPLASTY, KNEE, TOTAL
Anesthesia: Spinal | Site: Knee | Laterality: Right

## 2018-09-02 MED ORDER — DIPHENHYDRAMINE HCL 12.5 MG/5ML PO ELIX: 12.5000 mg | ORAL_SOLUTION | ORAL | Status: DC | PRN

## 2018-09-02 MED ORDER — MENTHOL 3 MG MT LOZG: 1.0000 | LOZENGE | OROMUCOSAL | Status: DC | PRN

## 2018-09-02 MED ORDER — SODIUM CHLORIDE (PF) 0.9 % IJ SOLN
INTRAMUSCULAR | Status: DC | PRN
  Administered 2018-09-02: 30 mL

## 2018-09-02 MED ORDER — BUPIVACAINE-EPINEPHRINE (PF) 0.25% -1:200000 IJ SOLN
INTRAMUSCULAR | Status: AC
  Filled 2018-09-02: qty 30

## 2018-09-02 MED ORDER — KETOROLAC TROMETHAMINE 30 MG/ML IJ SOLN
INTRAMUSCULAR | Status: AC
  Filled 2018-09-02: qty 1

## 2018-09-02 MED ORDER — LEVOTHYROXINE SODIUM 75 MCG PO TABS
75.0000 ug | ORAL_TABLET | Freq: Every day | ORAL | Status: DC
  Administered 2018-09-03: 75 ug via ORAL
  Filled 2018-09-02 (×2): qty 1

## 2018-09-02 MED ORDER — ACETAMINOPHEN 500 MG PO TABS
1000.0000 mg | ORAL_TABLET | Freq: Four times a day (QID) | ORAL | Status: DC
  Administered 2018-09-02 – 2018-09-03 (×3): 1000 mg via ORAL
  Filled 2018-09-02 (×3): qty 2

## 2018-09-02 MED ORDER — BISACODYL 10 MG RE SUPP: 10.0000 mg | Freq: Every day | RECTAL | Status: DC | PRN

## 2018-09-02 MED ORDER — LACTATED RINGERS IV SOLN
INTRAVENOUS | Status: DC
  Administered 2018-09-02: 08:00:00 via INTRAVENOUS

## 2018-09-02 MED ORDER — DEXAMETHASONE SODIUM PHOSPHATE 10 MG/ML IJ SOLN
10.0000 mg | Freq: Once | INTRAMUSCULAR | Status: AC
  Administered 2018-09-02: 10 mg via INTRAVENOUS

## 2018-09-02 MED ORDER — ALUM & MAG HYDROXIDE-SIMETH 200-200-20 MG/5ML PO SUSP: 15.0000 mL | ORAL | Status: DC | PRN

## 2018-09-02 MED ORDER — ASPIRIN 81 MG PO CHEW
81.0000 mg | CHEWABLE_TABLET | Freq: Two times a day (BID) | ORAL | Status: DC
  Administered 2018-09-02 – 2018-09-03 (×2): 81 mg via ORAL
  Filled 2018-09-02 (×2): qty 1

## 2018-09-02 MED ORDER — MIDAZOLAM HCL 2 MG/2ML IJ SOLN
1.0000 mg | Freq: Once | INTRAMUSCULAR | Status: AC
  Administered 2018-09-02: 09:00:00 1 mg via INTRAVENOUS
  Filled 2018-09-02: qty 2

## 2018-09-02 MED ORDER — CEFAZOLIN SODIUM-DEXTROSE 2-4 GM/100ML-% IV SOLN
2.0000 g | Freq: Four times a day (QID) | INTRAVENOUS | Status: AC
  Administered 2018-09-02 (×2): 2 g via INTRAVENOUS
  Filled 2018-09-02 (×2): qty 100

## 2018-09-02 MED ORDER — CEFAZOLIN SODIUM-DEXTROSE 2-4 GM/100ML-% IV SOLN
2.0000 g | INTRAVENOUS | Status: AC
  Administered 2018-09-02: 2 g via INTRAVENOUS

## 2018-09-02 MED ORDER — BUPIVACAINE-EPINEPHRINE (PF) 0.25% -1:200000 IJ SOLN
INTRAMUSCULAR | Status: DC | PRN
  Administered 2018-09-02: 30 mL

## 2018-09-02 MED ORDER — HYDROCHLOROTHIAZIDE 25 MG PO TABS
25.0000 mg | ORAL_TABLET | Freq: Every day | ORAL | Status: DC
  Administered 2018-09-02: 25 mg via ORAL
  Filled 2018-09-02 (×2): qty 1

## 2018-09-02 MED ORDER — EPHEDRINE 5 MG/ML INJ
INTRAVENOUS | Status: AC
  Filled 2018-09-02: qty 10

## 2018-09-02 MED ORDER — STERILE WATER FOR IRRIGATION IR SOLN
Status: DC | PRN
  Administered 2018-09-02: 2000 mL

## 2018-09-02 MED ORDER — ONDANSETRON HCL 4 MG/2ML IJ SOLN: 4.0000 mg | Freq: Four times a day (QID) | INTRAMUSCULAR | Status: DC | PRN

## 2018-09-02 MED ORDER — DOCUSATE SODIUM 100 MG PO CAPS
100.0000 mg | ORAL_CAPSULE | Freq: Two times a day (BID) | ORAL | Status: DC
  Administered 2018-09-02 – 2018-09-03 (×2): 100 mg via ORAL
  Filled 2018-09-02 (×2): qty 1

## 2018-09-02 MED ORDER — POLYETHYLENE GLYCOL 3350 17 G PO PACK
17.0000 g | PACK | Freq: Two times a day (BID) | ORAL | Status: DC
  Administered 2018-09-02: 17 g via ORAL
  Filled 2018-09-02 (×2): qty 1

## 2018-09-02 MED ORDER — CHLORHEXIDINE GLUCONATE 4 % EX LIQD: 60.0000 mL | Freq: Once | CUTANEOUS | Status: DC

## 2018-09-02 MED ORDER — FERROUS SULFATE 325 (65 FE) MG PO TABS
325.0000 mg | ORAL_TABLET | Freq: Two times a day (BID) | ORAL | Status: DC
  Administered 2018-09-02 – 2018-09-03 (×2): 325 mg via ORAL
  Filled 2018-09-02 (×2): qty 1

## 2018-09-02 MED ORDER — ONDANSETRON HCL 4 MG/2ML IJ SOLN
INTRAMUSCULAR | Status: AC
  Filled 2018-09-02: qty 2

## 2018-09-02 MED ORDER — PHENOL 1.4 % MT LIQD
1.0000 | OROMUCOSAL | Status: DC | PRN
  Filled 2018-09-02: qty 177

## 2018-09-02 MED ORDER — KETOROLAC TROMETHAMINE 30 MG/ML IJ SOLN
INTRAMUSCULAR | Status: DC | PRN
  Administered 2018-09-02: 30 mg via INTRA_ARTICULAR

## 2018-09-02 MED ORDER — HYDROMORPHONE HCL 2 MG PO TABS
2.0000 mg | ORAL_TABLET | ORAL | Status: DC
  Administered 2018-09-02 (×3): 2 mg via ORAL
  Administered 2018-09-03: 4 mg via ORAL
  Filled 2018-09-02 (×2): qty 1
  Filled 2018-09-02: qty 2
  Filled 2018-09-02: qty 1

## 2018-09-02 MED ORDER — PROPOFOL 10 MG/ML IV BOLUS
INTRAVENOUS | Status: DC | PRN
  Administered 2018-09-02: 20 mg via INTRAVENOUS
  Administered 2018-09-02: 10 mg via INTRAVENOUS
  Administered 2018-09-02: 20 mg via INTRAVENOUS

## 2018-09-02 MED ORDER — METHOCARBAMOL 500 MG PO TABS
500.0000 mg | ORAL_TABLET | Freq: Four times a day (QID) | ORAL | Status: DC | PRN
  Administered 2018-09-02: 500 mg via ORAL
  Filled 2018-09-02 (×2): qty 1

## 2018-09-02 MED ORDER — DEXAMETHASONE SODIUM PHOSPHATE 10 MG/ML IJ SOLN
10.0000 mg | Freq: Once | INTRAMUSCULAR | Status: AC
  Administered 2018-09-03: 10 mg via INTRAVENOUS
  Filled 2018-09-02: qty 1

## 2018-09-02 MED ORDER — MAGNESIUM CITRATE PO SOLN: 1.0000 | Freq: Once | ORAL | Status: DC | PRN

## 2018-09-02 MED ORDER — FENTANYL CITRATE (PF) 100 MCG/2ML IJ SOLN: 25.0000 ug | INTRAMUSCULAR | Status: DC | PRN

## 2018-09-02 MED ORDER — PROPOFOL 10 MG/ML IV BOLUS
INTRAVENOUS | Status: AC
  Filled 2018-09-02: qty 20

## 2018-09-02 MED ORDER — SODIUM CHLORIDE 0.9 % IV SOLN
INTRAVENOUS | Status: DC
  Administered 2018-09-02 – 2018-09-03 (×2): via INTRAVENOUS

## 2018-09-02 MED ORDER — SODIUM CHLORIDE (PF) 0.9 % IJ SOLN
INTRAMUSCULAR | Status: AC
  Filled 2018-09-02: qty 50

## 2018-09-02 MED ORDER — LOSARTAN POTASSIUM 50 MG PO TABS
100.0000 mg | ORAL_TABLET | Freq: Every day | ORAL | Status: DC
  Administered 2018-09-02: 100 mg via ORAL
  Filled 2018-09-02 (×2): qty 2

## 2018-09-02 MED ORDER — PROPOFOL 500 MG/50ML IV EMUL
INTRAVENOUS | Status: DC | PRN
  Administered 2018-09-02: 135 ug/kg/min via INTRAVENOUS

## 2018-09-02 MED ORDER — CEFAZOLIN SODIUM-DEXTROSE 2-4 GM/100ML-% IV SOLN
INTRAVENOUS | Status: AC
  Filled 2018-09-02: qty 100

## 2018-09-02 MED ORDER — HYDROMORPHONE HCL 1 MG/ML IJ SOLN
0.5000 mg | INTRAMUSCULAR | Status: DC | PRN
  Administered 2018-09-03: 1 mg via INTRAVENOUS
  Filled 2018-09-02: qty 1

## 2018-09-02 MED ORDER — METHOCARBAMOL 1000 MG/10ML IJ SOLN
500.0000 mg | Freq: Four times a day (QID) | INTRAVENOUS | Status: DC | PRN
  Filled 2018-09-02: qty 5

## 2018-09-02 MED ORDER — EPHEDRINE SULFATE-NACL 50-0.9 MG/10ML-% IV SOSY
PREFILLED_SYRINGE | INTRAVENOUS | Status: DC | PRN
  Administered 2018-09-02: 10 mg via INTRAVENOUS

## 2018-09-02 MED ORDER — PROPOFOL 10 MG/ML IV BOLUS
INTRAVENOUS | Status: AC
  Filled 2018-09-02: qty 80

## 2018-09-02 MED ORDER — FENTANYL CITRATE (PF) 100 MCG/2ML IJ SOLN
50.0000 ug | Freq: Once | INTRAMUSCULAR | Status: AC
  Administered 2018-09-02: 50 ug via INTRAVENOUS
  Filled 2018-09-02: qty 2

## 2018-09-02 MED ORDER — PROMETHAZINE HCL 25 MG/ML IJ SOLN: 6.2500 mg | INTRAMUSCULAR | Status: DC | PRN

## 2018-09-02 MED ORDER — TRANEXAMIC ACID-NACL 1000-0.7 MG/100ML-% IV SOLN
INTRAVENOUS | Status: AC
  Filled 2018-09-02: qty 100

## 2018-09-02 MED ORDER — BUPIVACAINE IN DEXTROSE 0.75-8.25 % IT SOLN
INTRATHECAL | Status: DC | PRN
  Administered 2018-09-02: 1.6 mL via INTRATHECAL

## 2018-09-02 MED ORDER — TRANEXAMIC ACID-NACL 1000-0.7 MG/100ML-% IV SOLN
1000.0000 mg | Freq: Once | INTRAVENOUS | Status: AC
  Administered 2018-09-02: 1000 mg via INTRAVENOUS
  Filled 2018-09-02: qty 100

## 2018-09-02 MED ORDER — ONDANSETRON HCL 4 MG/2ML IJ SOLN
INTRAMUSCULAR | Status: DC | PRN
  Administered 2018-09-02: 4 mg via INTRAVENOUS

## 2018-09-02 MED ORDER — METOCLOPRAMIDE HCL 5 MG PO TABS: 5.0000 mg | ORAL_TABLET | Freq: Three times a day (TID) | ORAL | Status: DC | PRN

## 2018-09-02 MED ORDER — 0.9 % SODIUM CHLORIDE (POUR BTL) OPTIME
TOPICAL | Status: DC | PRN
  Administered 2018-09-02: 1000 mL

## 2018-09-02 MED ORDER — POVIDONE-IODINE 10 % EX SWAB
2.0000 "application " | Freq: Once | CUTANEOUS | Status: AC
  Administered 2018-09-02: 2 via TOPICAL

## 2018-09-02 MED ORDER — SODIUM CHLORIDE 0.9 % IV SOLN
INTRAVENOUS | Status: DC | PRN
  Administered 2018-09-02: 25 ug/min via INTRAVENOUS

## 2018-09-02 MED ORDER — METOPROLOL SUCCINATE ER 25 MG PO TB24
25.0000 mg | ORAL_TABLET | Freq: Every day | ORAL | Status: DC
  Administered 2018-09-02: 25 mg via ORAL
  Filled 2018-09-02: qty 1

## 2018-09-02 MED ORDER — MONTELUKAST SODIUM 10 MG PO TABS
10.0000 mg | ORAL_TABLET | Freq: Every day | ORAL | Status: DC
  Administered 2018-09-02: 10 mg via ORAL
  Filled 2018-09-02: qty 1

## 2018-09-02 MED ORDER — LACTATED RINGERS IV SOLN: INTRAVENOUS | Status: DC

## 2018-09-02 MED ORDER — METOCLOPRAMIDE HCL 5 MG/ML IJ SOLN: 5.0000 mg | Freq: Three times a day (TID) | INTRAMUSCULAR | Status: DC | PRN

## 2018-09-02 MED ORDER — LOSARTAN POTASSIUM-HCTZ 100-25 MG PO TABS: 1.0000 | ORAL_TABLET | Freq: Every day | ORAL | Status: DC

## 2018-09-02 MED ORDER — DEXAMETHASONE SODIUM PHOSPHATE 10 MG/ML IJ SOLN
INTRAMUSCULAR | Status: AC
  Filled 2018-09-02: qty 1

## 2018-09-02 MED ORDER — TRANEXAMIC ACID-NACL 1000-0.7 MG/100ML-% IV SOLN
1000.0000 mg | INTRAVENOUS | Status: AC
  Administered 2018-09-02: 11:00:00 1000 mg via INTRAVENOUS

## 2018-09-02 MED ORDER — ROPIVACAINE HCL 5 MG/ML IJ SOLN
INTRAMUSCULAR | Status: DC | PRN
  Administered 2018-09-02: 20 mL via PERINEURAL

## 2018-09-02 MED ORDER — PHENYLEPHRINE HCL (PRESSORS) 10 MG/ML IV SOLN
INTRAVENOUS | Status: AC
  Filled 2018-09-02: qty 1

## 2018-09-02 MED ORDER — ONDANSETRON HCL 4 MG PO TABS: 4.0000 mg | ORAL_TABLET | Freq: Four times a day (QID) | ORAL | Status: DC | PRN

## 2018-09-02 SURGICAL SUPPLY — 60 items
ATTUNE MED ANAT PAT 35 KNEE (Knees) ×1 IMPLANT
ATTUNE MED ANAT PAT 35MM KNEE (Knees) ×1 IMPLANT
ATTUNE PSFEM RTSZ4 NARCEM KNEE (Femur) ×2 IMPLANT
ATTUNE PSRP INSR SZ4 6 KNEE (Insert) ×1 IMPLANT
ATTUNE PSRP INSR SZ4 6MM KNEE (Insert) ×1 IMPLANT
BAG ZIPLOCK 12X15 (MISCELLANEOUS) IMPLANT
BASE TIBIAL ROT PLAT SZ 3 KNEE (Knees) IMPLANT
BLADE SAW SGTL 11.0X1.19X90.0M (BLADE) ×2 IMPLANT
BLADE SAW SGTL 13.0X1.19X90.0M (BLADE) ×3 IMPLANT
BLADE SURG SZ10 CARB STEEL (BLADE) ×6 IMPLANT
BNDG ELASTIC 6X10 VLCR STRL LF (GAUZE/BANDAGES/DRESSINGS) ×2 IMPLANT
BNDG ELASTIC 6X5.8 VLCR STR LF (GAUZE/BANDAGES/DRESSINGS) ×3 IMPLANT
BOWL SMART MIX CTS (DISPOSABLE) ×3 IMPLANT
CEMENT HV SMART SET (Cement) ×4 IMPLANT
COVER SURGICAL LIGHT HANDLE (MISCELLANEOUS) ×3 IMPLANT
COVER WAND RF STERILE (DRAPES) IMPLANT
CUFF TOURN SGL QUICK 34 (TOURNIQUET CUFF) ×2
CUFF TRNQT CYL 34X4.125X (TOURNIQUET CUFF) ×1 IMPLANT
DECANTER SPIKE VIAL GLASS SM (MISCELLANEOUS) ×6 IMPLANT
DERMABOND ADVANCED (GAUZE/BANDAGES/DRESSINGS) ×2
DERMABOND ADVANCED .7 DNX12 (GAUZE/BANDAGES/DRESSINGS) ×1 IMPLANT
DRAPE U-SHAPE 47X51 STRL (DRAPES) ×3 IMPLANT
DRESSING AQUACEL AG SP 3.5X10 (GAUZE/BANDAGES/DRESSINGS) ×1 IMPLANT
DRSG AQUACEL AG SP 3.5X10 (GAUZE/BANDAGES/DRESSINGS) ×3
DURAPREP 26ML APPLICATOR (WOUND CARE) ×6 IMPLANT
ELECT REM PT RETURN 15FT ADLT (MISCELLANEOUS) ×3 IMPLANT
GLOVE BIO SURGEON STRL SZ 6 (GLOVE) ×3 IMPLANT
GLOVE BIOGEL PI IND STRL 6.5 (GLOVE) ×1 IMPLANT
GLOVE BIOGEL PI IND STRL 7.5 (GLOVE) ×1 IMPLANT
GLOVE BIOGEL PI IND STRL 8.5 (GLOVE) ×1 IMPLANT
GLOVE BIOGEL PI INDICATOR 6.5 (GLOVE) ×2
GLOVE BIOGEL PI INDICATOR 7.5 (GLOVE) ×2
GLOVE BIOGEL PI INDICATOR 8.5 (GLOVE) ×2
GLOVE ECLIPSE 8.0 STRL XLNG CF (GLOVE) ×3 IMPLANT
GLOVE ORTHO TXT STRL SZ7.5 (GLOVE) ×3 IMPLANT
GOWN STRL REUS W/ TWL LRG LVL3 (GOWN DISPOSABLE) ×1 IMPLANT
GOWN STRL REUS W/TWL 2XL LVL3 (GOWN DISPOSABLE) ×3 IMPLANT
GOWN STRL REUS W/TWL LRG LVL3 (GOWN DISPOSABLE) ×5 IMPLANT
HANDPIECE INTERPULSE COAX TIP (DISPOSABLE) ×2
HOLDER FOLEY CATH W/STRAP (MISCELLANEOUS) ×2 IMPLANT
KIT TURNOVER KIT A (KITS) IMPLANT
MANIFOLD NEPTUNE II (INSTRUMENTS) ×3 IMPLANT
NDL SAFETY ECLIPSE 18X1.5 (NEEDLE) IMPLANT
NEEDLE HYPO 18GX1.5 SHARP (NEEDLE) ×2
NS IRRIG 1000ML POUR BTL (IV SOLUTION) ×3 IMPLANT
PACK TOTAL KNEE CUSTOM (KITS) ×3 IMPLANT
PROTECTOR NERVE ULNAR (MISCELLANEOUS) ×3 IMPLANT
SET HNDPC FAN SPRY TIP SCT (DISPOSABLE) ×1 IMPLANT
SET PAD KNEE POSITIONER (MISCELLANEOUS) ×3 IMPLANT
SUT MNCRL AB 4-0 PS2 18 (SUTURE) ×3 IMPLANT
SUT STRATAFIX PDS+ 0 24IN (SUTURE) ×3 IMPLANT
SUT VIC AB 1 CT1 36 (SUTURE) ×3 IMPLANT
SUT VIC AB 2-0 CT1 27 (SUTURE) ×6
SUT VIC AB 2-0 CT1 TAPERPNT 27 (SUTURE) ×3 IMPLANT
SYR 3ML LL SCALE MARK (SYRINGE) ×3 IMPLANT
TIBIAL BASE ROT PLAT SZ 3 KNEE (Knees) ×3 IMPLANT
TRAY FOLEY MTR SLVR 14FR STAT (SET/KITS/TRAYS/PACK) ×2 IMPLANT
WATER STERILE IRR 1000ML POUR (IV SOLUTION) ×6 IMPLANT
WRAP KNEE MAXI GEL POST OP (GAUZE/BANDAGES/DRESSINGS) ×3 IMPLANT
YANKAUER SUCT BULB TIP 10FT TU (MISCELLANEOUS) ×3 IMPLANT

## 2018-09-02 NOTE — Anesthesia Procedure Notes (Signed)
Spinal  Patient location during procedure: OR Start time: 09/02/2018 10:17 AM End time: 09/02/2018 10:23 AM Staffing Anesthesiologist: Suzette Battiest, MD Performed: anesthesiologist  Preanesthetic Checklist Completed: patient identified, site marked, surgical consent, pre-op evaluation, timeout performed, IV checked, risks and benefits discussed and monitors and equipment checked Spinal Block Patient position: sitting Prep: DuraPrep Patient monitoring: heart rate, cardiac monitor, continuous pulse ox and blood pressure Approach: midline Location: L3-4 Injection technique: single-shot Needle Needle type: Pencan  Needle gauge: 24 G Needle length: 9 cm Assessment Sensory level: T4

## 2018-09-02 NOTE — Evaluation (Signed)
Physical Therapy Evaluation Patient Details Name: Kiara Miller MRN: [id number] DOB: 1950-05-04 Today's Date: 09/02/2018   History of Present Illness  68 yo female s/p R TKR on 09/02/18. PMH includes HTN, obesity, DDD, carpal tunnel, dyspnea, HLD.  Clinical Impression  Pt presents with R knee pain, decreased R knee ROM, post-operative RLE weakness, difficulty performing mobility tasks, and decreased activity tolerance due to pain and fatigue. Pt to benefit from acute PT to address deficits. Pt ambulated short room distance with min assist for steadying and RLE guarding, verbal cuing for form and safety provided throughout. Pt educated on ankle pumps (20/hour) to perform this afternoon/evening to increase circulation, to pt's tolerance and limited by pain. PT to progress mobility as tolerated, and will continue to follow acutely.        Follow Up Recommendations Follow surgeon's recommendation for DC plan and follow-up therapies;Supervision for mobility/OOB    Equipment Recommendations  None recommended by PT    Recommendations for Other Services       Precautions / Restrictions Precautions Precautions: Fall Restrictions Weight Bearing Restrictions: No Other Position/Activity Restrictions: WBAT      Mobility  Bed Mobility Overal bed mobility: Needs Assistance Bed Mobility: Supine to Sit     Supine to sit: Min assist;HOB elevated     General bed mobility comments: Min assist for RLE lifting, translation to EOB. Increased time and effort with use of overhead trapeze to come to sitting.  Transfers Overall transfer level: Needs assistance Equipment used: Rolling walker (2 wheeled) Transfers: Sit to/from Stand Sit to Stand: Min assist;From elevated surface         General transfer comment: Min assist for power up, steadying, R knee guarding upon standing and weightshifting. Verbal cuing for hand placement when rising.  Ambulation/Gait Ambulation/Gait assistance: Min  assist Gait Distance (Feet): 10 Feet Assistive device: Rolling walker (2 wheeled) Gait Pattern/deviations: Step-to pattern;Decreased step length - right;Decreased step length - left;Antalgic;Decreased weight shift to right;Decreased stance time - right Gait velocity: decr   General Gait Details: Min assist for RLE guarding, mild buckling present in RLE stance phase. Verbal cuing for placement in RW, sequencing with step-to gait pattern, upright posture.  Stairs            Wheelchair Mobility    Modified Rankin (Stroke Patients Only)       Balance Overall balance assessment: Mild deficits observed, not formally tested                                           Pertinent Vitals/Pain Pain Assessment: 0-10 Pain Score: 5  Pain Location: R knee/thigh Pain Descriptors / Indicators: Sore Pain Intervention(s): Limited activity within patient's tolerance;Repositioned;Monitored during session;Premedicated before session    Home Living Family/patient expects to be discharged to:: Private residence Living Arrangements: Spouse/significant other Available Help at Discharge: Family Type of Home: House Home Access: Stairs to enter Entrance Stairs-Rails: Right Entrance Stairs-Number of Steps: 2 Home Layout: One level Home Equipment: Environmental consultant - 2 wheels;Walker - 4 wheels;Cane - single point;Shower seat - built in;Hand held shower head;Grab bars - tub/shower      Prior Function Level of Independence: Independent with assistive device(s)         Comments: pt reports using cane PTA for ambulation, otherwise independent. Pt is a retired Therapist, sports.     Hand Dominance   Dominant Hand: Right  Extremity/Trunk Assessment   Upper Extremity Assessment Upper Extremity Assessment: Overall WFL for tasks assessed    Lower Extremity Assessment Lower Extremity Assessment: Overall WFL for tasks assessed;RLE deficits/detail RLE Deficits / Details: suspected post-surgical  weakness; able to perform ankle pumps, quad set, knee flexion EOB to 60*, SLR with significant lift assist RLE Sensation: WNL    Cervical / Trunk Assessment Cervical / Trunk Assessment: Normal  Communication   Communication: No difficulties  Cognition Arousal/Alertness: Awake/alert Behavior During Therapy: WFL for tasks assessed/performed Overall Cognitive Status: Within Functional Limits for tasks assessed                                        General Comments      Exercises     Assessment/Plan    PT Assessment Patient needs continued PT services  PT Problem List Decreased mobility;Decreased strength;Decreased range of motion;Decreased activity tolerance;Decreased balance;Decreased knowledge of use of DME;Pain       PT Treatment Interventions DME instruction;Therapeutic activities;Gait training;Therapeutic exercise;Patient/family education;Balance training;Stair training;Functional mobility training    PT Goals (Current goals can be found in the Care Plan section)  Acute Rehab PT Goals Patient Stated Goal: go home with husband PT Goal Formulation: With patient Time For Goal Achievement: 09/09/18 Potential to Achieve Goals: Good    Frequency 7X/week   Barriers to discharge        Co-evaluation               AM-PAC PT "6 Clicks" Mobility  Outcome Measure Help needed turning from your back to your side while in a flat bed without using bedrails?: A Little Help needed moving from lying on your back to sitting on the side of a flat bed without using bedrails?: A Little Help needed moving to and from a bed to a chair (including a wheelchair)?: A Little Help needed standing up from a chair using your arms (e.g., wheelchair or bedside chair)?: A Little Help needed to walk in hospital room?: A Little Help needed climbing 3-5 steps with a railing? : A Lot 6 Click Score: 17    End of Session Equipment Utilized During Treatment: Gait belt Activity  Tolerance: Patient limited by pain;Patient limited by fatigue Patient left: in chair;with call bell/phone within reach;with SCD's reapplied(pt verbally agrees to press call button and wait for assist before mobilizing) Nurse Communication: Mobility status PT Visit Diagnosis: Other abnormalities of gait and mobility (R26.89);Difficulty in walking, not elsewhere classified (R26.2)    Time: 8250-5397 PT Time Calculation (min) (ACUTE ONLY): 30 min   Charges:   PT Evaluation $PT Eval Low Complexity: 1 Low PT Treatments $Gait Training: 8-22 mins      Nicola Police, PT Acute Rehabilitation Services Pager [phone]  Office [phone]  Loucile Posner D Despina Hidden 09/02/2018, 5:41 PM

## 2018-09-02 NOTE — Discharge Instructions (Signed)

## 2018-09-02 NOTE — Progress Notes (Signed)
AssistedDr. Rob Fitzgerald with right, ultrasound guided, adductor canal block. Side rails up, monitors on throughout procedure. See vital signs in flow sheet. Tolerated Procedure well.  

## 2018-09-02 NOTE — Op Note (Signed)
NAME:  Kiara Miller                      MEDICAL RECORD NO.:  [id number]                             [facility]      PHYSICIAN:  Madlyn FrankelMatthew D. Charlann Boxerlin, M.D.  DATE OF BIRTH:  December 19, 1950      DATE OF PROCEDURE:  09/02/2018                                     OPERATIVE REPORT         PREOPERATIVE DIAGNOSIS:  Right knee osteoarthritis.      POSTOPERATIVE DIAGNOSIS:  Right knee osteoarthritis.      FINDINGS:  The patient was noted to have complete loss of cartilage and   bone-on-bone arthritis with associated osteophytes in the medial and patellofemoral compartments of   the knee with a significant synovitis and associated effusion.  The patient had failed months of conservative treatment including medications, injection therapy, activity modification.     PROCEDURE:  Right total knee replacement.      COMPONENTS USED:  DePuy Attune rotating platform posterior stabilized knee   system, a size 4N femur, 3 tibia, size 6 mm PS AOX insert, and 35 anatomic patellar   button.      SURGEON:  Madlyn FrankelMatthew D. Charlann Boxerlin, M.D.      ASSISTANT:  Dennie BibleAshley Stinson, PA-C.      ANESTHESIA:  Regional and Spinal.      SPECIMENS:  None.      COMPLICATION:  None.      DRAINS:  None.  EBL: <100cc      TOURNIQUET TIME:   Total Tourniquet Time Documented: Thigh (Right) - 26 minutes Total: Thigh (Right) - 26 minutes  .      The patient was stable to the recovery room.      INDICATION FOR PROCEDURE:  Kiara Miller is a 68 y.o. female patient of   mine.  The patient had been seen, evaluated, and treated for months conservatively in the   office with medication, activity modification, and injections.  The patient had   radiographic changes of bone-on-bone arthritis with endplate sclerosis and osteophytes noted.  Based on the radiographic changes and failed conservative measures, the patient   decided to proceed with definitive treatment, total knee replacement.  Risks of infection, DVT, component failure,  need for revision surgery, neurovascular injury were reviewed in the office setting.  The postop course was reviewed stressing the efforts to maximize post-operative satisfaction and function.  Consent was obtained for benefit of pain   relief.      PROCEDURE IN DETAIL:  The patient was brought to the operative theater.   Once adequate anesthesia, preoperative antibiotics, 2 gm of Ancef,1 gm of Tranexamic Acid, and 10 mg of Decadron administered, the patient was positioned supine with a right thigh tourniquet placed.  The  right lower extremity was prepped and draped in sterile fashion.  A time-   out was performed identifying the patient, planned procedure, and the appropriate extremity.      The right lower extremity was placed in the [facility] leg holder.  The leg was   exsanguinated, tourniquet elevated to 250 mmHg.  A midline incision was   made  followed by median parapatellar arthrotomy.  Following initial   exposure, attention was first directed to the patella.  Precut   measurement was noted to be 22 mm.  I resected down to 13 mm and used a   35 anatomic patellar button to restore patellar height as well as cover the cut surface.      The lug holes were drilled and a metal shim was placed to protect the   patella from retractors and saw blade during the procedure.      At this point, attention was now directed to the femur.  The femoral   canal was opened with a drill, irrigated to try to prevent fat emboli.  An   intramedullary rod was passed at 3 degrees valgus, 9 mm of bone was   resected off the distal femur.  Following this resection, the tibia was   subluxated anteriorly.  Using the extramedullary guide, 2 mm of bone was resected off   the proximal medial tibia.  We confirmed the gap would be   stable medially and laterally with a size 5 spacer block as well as confirmed that the tibial cut was perpendicular in the coronal plane, checking with an alignment rod.      Once this was  done, I sized the femur to be a size 4 in the anterior-   posterior dimension, chose a narrow component based on medial and   lateral dimension.  The size 4 rotation block was then pinned in   position anterior referenced using the C-clamp to set rotation.  The   anterior, posterior, and  chamfer cuts were made without difficulty nor   notching making certain that I was along the anterior cortex to help   with flexion gap stability.      The final box cut was made off the lateral aspect of distal femur.      At this point, the tibia was sized to be a size 3.  The size 3 tray was   then pinned in position through the medial third of the tubercle,   drilled, and keel punched.  Trial reduction was now carried with a 4 femur,  3 tibia, a size 6  mm PS insert, and the 35 anatomic patella botton.  The knee was brought to full extension with good flexion stability with the patella   tracking through the trochlea without application of pressure.  Given   all these findings the trial components removed.  Final components were   opened and cement was mixed.  The knee was irrigated with normal saline solution and pulse lavage.  The synovial lining was   then injected with 30 cc of 0.25% Marcaine with epinephrine, 1 cc of Toradol and 30 cc of NS for a total of 61 cc.     Final implants were then cemented onto cleaned and dried cut surfaces of bone with the knee brought to extension with a size 6 mm PS trial insert.      Once the cement had fully cured, excess cement was removed   throughout the knee.  I confirmed that I was satisfied with the range of   motion and stability, and the final size 6 mm PS AOX insert was chosen.  It was   placed into the knee.      The tourniquet had been let down at 26 minutes.  No significant   hemostasis was required.  The extensor mechanism was then reapproximated using #1 Vicryl  and #1 Stratafix sutures with the knee   in flexion.  The   remaining wound was closed  with 2-0 Vicryl and running 4-0 Monocryl.   The knee was cleaned, dried, dressed sterilely using Dermabond and   Aquacel dressing.  The patient was then   brought to recovery room in stable condition, tolerating the procedure   well.   Please note that Physician Assistant, Griffith Citron, PA-C was present for the entirety of the case, and was utilized for pre-operative positioning, peri-operative retractor management, general facilitation of the procedure and for primary wound closure at the end of the case.              Pietro Cassis Alvan Dame, M.D.    09/02/2018 11:33 AM

## 2018-09-02 NOTE — Anesthesia Postprocedure Evaluation (Signed)
Anesthesia Post Note  Patient: Kiara Miller  Procedure(s) Performed: TOTAL KNEE ARTHROPLASTY (Right Knee)     Patient location during evaluation: PACU Anesthesia Type: Spinal Level of consciousness: awake and alert Pain management: pain level controlled Vital Signs Assessment: post-procedure vital signs reviewed and stable Respiratory status: spontaneous breathing and respiratory function stable Cardiovascular status: blood pressure returned to baseline and stable Postop Assessment: spinal receding Anesthetic complications: no    Last Vitals:  Vitals:   09/02/18 1504 09/02/18 1603  BP: (!) 138/52 (!) 127/52  Pulse: (!) 57 (!) 56  Resp: 14 16  Temp: 36.4 C 36.5 C  SpO2: 98% 98%    Last Pain:  Vitals:   09/02/18 1603  TempSrc: Oral  PainSc:                  [name]

## 2018-09-02 NOTE — Anesthesia Procedure Notes (Addendum)
Anesthesia Regional Block: Adductor canal block   Pre-Anesthetic Checklist: ,, timeout performed, Correct Patient, Correct Site, Correct Laterality, Correct Procedure, Correct Position, site marked, Risks and benefits discussed,  Surgical consent,  Pre-op evaluation,  At surgeon's request and post-op pain management  Laterality: Right  Prep: chloraprep       Needles:  Injection technique: Single-shot  Needle Type: Echogenic Needle     Needle Length: 9cm  Needle Gauge: 21     Additional Needles:   Procedures:,,,, ultrasound used (permanent image in chart),,,,  Narrative:  Start time: 09/02/2018 9:03 AM End time: 09/02/2018 9:08 AM Injection made incrementally with aspirations every 5 mL.  Performed by: Personally  Anesthesiologist: Suzette Battiest, MD

## 2018-09-02 NOTE — Transfer of Care (Signed)
Immediate Anesthesia Transfer of Care Note  Patient: Kiara Miller  Procedure(s) Performed: TOTAL KNEE ARTHROPLASTY (Right Knee)  Patient Location: PACU  Anesthesia Type:Spinal  Level of Consciousness: awake and alert   Airway & Oxygen Therapy: Patient Spontanous Breathing and Patient connected to face mask oxygen  Post-op Assessment: Report given to RN and Post -op Vital signs reviewed and stable  Post vital signs: Reviewed and stable  Last Vitals:  Vitals Value Taken Time  BP 99/52 09/02/18 1203  Temp    Pulse 56 09/02/18 1205  Resp 16 09/02/18 1205  SpO2 100 % 09/02/18 1205  Vitals shown include unvalidated device data.  Last Pain:  Vitals:   09/02/18 0740  TempSrc:   PainSc: 7       Patients Stated Pain Goal: 5 (11/91/47 8295)  Complications: No apparent anesthesia complications

## 2018-09-02 NOTE — Anesthesia Procedure Notes (Signed)
Procedure Name: MAC Date/Time: 09/02/2018 10:11 AM Performed by: Niel Hummer, CRNA Pre-anesthesia Checklist: Patient identified, Emergency Drugs available, Suction available and Patient being monitored Patient Re-evaluated:Patient Re-evaluated prior to induction Oxygen Delivery Method: Simple face mask

## 2018-09-02 NOTE — Interval H&P Note (Signed)
History and Physical Interval Note:  09/02/2018 8:49 AM  Kiara Miller  has presented today for surgery, with the diagnosis of Right knee osteoarthritis.  The various methods of treatment have been discussed with the patient and family. After consideration of risks, benefits and other options for treatment, the patient has consented to  Procedure(s) with comments: TOTAL KNEE ARTHROPLASTY (Right) - 70 mins as a surgical intervention.  The patient's history has been reviewed, patient examined, no change in status, stable for surgery.  I have reviewed the patient's chart and labs.  Questions were answered to the patient's satisfaction.     Mauri Pole

## 2018-09-03 ENCOUNTER — Encounter (HOSPITAL_COMMUNITY): Payer: Self-pay | Admitting: Orthopedic Surgery

## 2018-09-03 DIAGNOSIS — E669 Obesity, unspecified: Secondary | ICD-10-CM | POA: Diagnosis present

## 2018-09-03 DIAGNOSIS — M1711 Unilateral primary osteoarthritis, right knee: Secondary | ICD-10-CM | POA: Diagnosis not present

## 2018-09-03 LAB — CBC
HCT: 38.6 % (ref 36.0–46.0)
Hemoglobin: 12.8 g/dL (ref 12.0–15.0)
MCH: 31.2 pg (ref 26.0–34.0)
MCHC: 33.2 g/dL (ref 30.0–36.0)
MCV: 94.1 fL (ref 80.0–100.0)
Platelets: 133 10*3/uL — ABNORMAL LOW (ref 150–400)
RBC: 4.1 MIL/uL (ref 3.87–5.11)
RDW: 12.5 % (ref 11.5–15.5)
WBC: 10.3 10*3/uL (ref 4.0–10.5)
nRBC: 0 % (ref 0.0–0.2)

## 2018-09-03 LAB — BASIC METABOLIC PANEL
Anion gap: 9 (ref 5–15)
BUN: 23 mg/dL (ref 8–23)
CO2: 22 mmol/L (ref 22–32)
Calcium: 8.6 mg/dL — ABNORMAL LOW (ref 8.9–10.3)
Chloride: 105 mmol/L (ref 98–111)
Creatinine, Ser: 0.76 mg/dL (ref 0.44–1.00)
GFR calc Af Amer: >60 mL/min (ref >60)
GFR calc non Af Amer: >60 mL/min (ref >60)
Glucose, Bld: 134 mg/dL — ABNORMAL HIGH (ref 70–99)
Potassium: 4.1 mmol/L (ref 3.5–5.1)
Sodium: 136 mmol/L (ref 135–145)

## 2018-09-03 MED ORDER — HYDROMORPHONE HCL 2 MG PO TABS: 2.0000 mg | ORAL_TABLET | ORAL | 0 refills | Status: DC | PRN

## 2018-09-03 MED ORDER — ASPIRIN 81 MG PO CHEW: 81.0000 mg | CHEWABLE_TABLET | Freq: Two times a day (BID) | ORAL | 0 refills | Status: AC

## 2018-09-03 MED ORDER — POLYETHYLENE GLYCOL 3350 17 G PO PACK: 17.0000 g | PACK | Freq: Two times a day (BID) | ORAL | 0 refills | Status: DC

## 2018-09-03 MED ORDER — FERROUS SULFATE 325 (65 FE) MG PO TABS: 325.0000 mg | ORAL_TABLET | Freq: Three times a day (TID) | ORAL | 0 refills | Status: DC

## 2018-09-03 MED ORDER — HYDROCODONE-ACETAMINOPHEN 7.5-325 MG PO TABS: 1.0000 | ORAL_TABLET | ORAL | 0 refills | Status: DC | PRN

## 2018-09-03 MED ORDER — DOCUSATE SODIUM 100 MG PO CAPS: 100.0000 mg | ORAL_CAPSULE | Freq: Two times a day (BID) | ORAL | 0 refills | Status: DC

## 2018-09-03 MED ORDER — METHOCARBAMOL 500 MG PO TABS: 500.0000 mg | ORAL_TABLET | Freq: Four times a day (QID) | ORAL | 0 refills | Status: DC | PRN

## 2018-09-03 NOTE — Progress Notes (Signed)
     Subjective: 1 Day Post-Op Procedure(s) (LRB): TOTAL KNEE ARTHROPLASTY (Right)   Patient reports pain as mild, pain controlled.  Patient states she has difficulty sleeping last night, otherwise no events.  We have discussed the procedure, expectations and plans moving forward.  Patient is ready to be discharged home today, if she does well therapy.   Patient's anticipated LOS is less than 2 midnights, meeting these requirements: - Lives within 1 hour of care - Has a competent adult at home to recover with post-op recover - NO history of  - Chronic pain requiring opiods  - Diabetes  - Coronary Artery Disease  - Heart failure  - Heart attack  - Stroke  - DVT/VTE  - Cardiac arrhythmia  - Respiratory Failure/COPD  - Renal failure  - Anemia  - Advanced Liver disease       Objective:   VITALS:   Vitals:   09/03/18 0103 09/03/18 0449  BP: 138/65 119/68  Pulse: (!) 57 (!) 51  Resp: 16 16  Temp: 97.7 F (36.5 C) 97.8 F (36.6 C)  SpO2: 98% 99%    Dorsiflexion/Plantar flexion intact Incision: dressing C/D/I No cellulitis present Compartment soft  LABS Recent Labs    09/03/18 0314  HGB 12.8  HCT 38.6  WBC 10.3  PLT 133*    Recent Labs    09/03/18 0314  NA 136  K 4.1  BUN 23  CREATININE 0.76  GLUCOSE 134*     Assessment/Plan: 1 Day Post-Op Procedure(s) (LRB): TOTAL KNEE ARTHROPLASTY (Right) Foley cath d/c'ed Advance diet Up with therapy D/C IV fluids Discharge home Follow up in 2 weeks at [facility] ([place] Follow up with OLIN,Talyn Eddie D in 2 weeks.  Contact information:  [facility] [address], [place] [phone]    Obese (BMI 30-39.9) Estimated body mass index is 31.32 kg/m as calculated from the following:   Height as of this encounter: 5' 1.5" (1.562 m).   Weight as of this encounter: 76.4 kg. Patient also counseled that weight may  inhibit the healing process Patient counseled that losing weight will help with future health issues       West Pugh. Kc Sedlak   PAC  09/03/2018, 8:27 AM

## 2018-09-03 NOTE — Progress Notes (Signed)
Physical Therapy Treatment Patient Details Name: Kiara Miller MRN: [id number] DOB: 02/20/1950 Today's Date: 09/03/2018    History of Present Illness 68 yo female s/p R TKR on 09/02/18. PMH includes HTN, obesity, DDD, carpal tunnel, dyspnea, HLD.    PT Comments    Progressing well with mobility. Reviewed/practiced exercises, gait training, and stair training. Issued HEP for pt to perform 3x/day until OP PT begins. All education completed. Okay to d/c from PT standpoint.    Follow Up Recommendations  Follow surgeon's recommendation for DC plan and follow-up therapies     Equipment Recommendations  None recommended by PT    Recommendations for Other Services       Precautions / Restrictions Precautions Precautions: Fall Restrictions Weight Bearing Restrictions: No Other Position/Activity Restrictions: WBAT    Mobility  Bed Mobility               General bed mobility comments: oob in recliner  Transfers Overall transfer level: Needs assistance Equipment used: Rolling walker (2 wheeled) Transfers: Sit to/from Stand Sit to Stand: Min guard         General transfer comment: close guard for safety. VCs safety, hand placement  Ambulation/Gait Ambulation/Gait assistance: Min guard Gait Distance (Feet): 100 Feet Assistive device: Rolling walker (2 wheeled) Gait Pattern/deviations: Step-to pattern;Step-through pattern;Decreased stride length     General Gait Details: close guard for safety. slow gait speed.   Stairs Stairs: Yes Min guard Stair Management: One rail Right;Sideways;Step to pattern Number of Stairs: 2 General stair comments: VCS safety, technique, sequence. Pt used 2 hands on 1 rail for support. She had some difficulty. Recommended she use 1 rail, 1 cane for a little additional support-pt in agreement.   Wheelchair Mobility    Modified Rankin (Stroke Patients Only)       Balance Overall balance assessment: Mild deficits observed, not  formally tested                                          Cognition Arousal/Alertness: Awake/alert Behavior During Therapy: WFL for tasks assessed/performed Overall Cognitive Status: Within Functional Limits for tasks assessed                                        Exercises Total Joint Exercises Ankle Circles/Pumps: AROM;Both;10 reps;Seated Quad Sets: AROM;Both;10 reps;Seated Straight Leg Raises: AROM;Right;10 reps;Supine Long Arc Quad: AROM;Right;10 reps;Seated Knee Flexion: AROM;Right;10 reps;Seated Goniometric ROM: ~5-100 degrees    General Comments        Pertinent Vitals/Pain Pain Assessment: 0-10 Pain Score: 6  Pain Location: R thigh Pain Descriptors / Indicators: Discomfort;Grimacing;Aching Pain Intervention(s): Monitored during session;Repositioned;Ice applied    Home Living                      Prior Function            PT Goals (current goals can now be found in the care plan section) Progress towards PT goals: Progressing toward goals    Frequency    7X/week      PT Plan Current plan remains appropriate    Co-evaluation              AM-PAC PT "6 Clicks" Mobility   Outcome Measure  Help needed turning from your back to your  side while in a flat bed without using bedrails?: A Little Help needed moving from lying on your back to sitting on the side of a flat bed without using bedrails?: A Little Help needed moving to and from a bed to a chair (including a wheelchair)?: A Little Help needed standing up from a chair using your arms (e.g., wheelchair or bedside chair)?: A Little Help needed to walk in hospital room?: A Little Help needed climbing 3-5 steps with a railing? : A Little 6 Click Score: 18    End of Session Equipment Utilized During Treatment: Gait belt Activity Tolerance: Patient tolerated treatment well Patient left: in chair;with call bell/phone within reach   PT Visit Diagnosis:  Other abnormalities of gait and mobility (R26.89);Pain Pain - Right/Left: Right Pain - part of body: Knee     Time: 0802-2336 PT Time Calculation (min) (ACUTE ONLY): 36 min  Charges:  $Gait Training: 8-22 mins $Therapeutic Exercise: 8-22 mins                       Weston Anna, PT Acute Rehabilitation Services Pager: [phone] Office: [phone]

## 2018-09-03 NOTE — TOC Transition Note (Signed)
Transition of Care [facility]) - CM/SW Discharge Note   Patient Details  Name: Kiara Miller MRN: [id number] Date of Birth: [birth year]  [facility]) CM/SW Contact:  Leeroy Cha, RN Phone Number: 09/03/2018, 10:02 AM   Clinical Narrative:    To home with oppt   Final next level of care: OP Rehab Barriers to Discharge: No Barriers Identified   Patient Goals and CMS Choice Patient states their goals for this hospitalization and ongoing recovery are:: to home and doing the therapy CMS Medicare.gov Compare Post Acute Care list provided to:: Patient Choice offered to / list presented to : Patient  Discharge Placement                       Discharge Plan and Services   Discharge Planning Services: CM Consult Post Acute Care Choice: Durable Medical Equipment          DME Arranged: Gilford Rile rolling, 3-N-1 DME Agency: [employer] Date DME Agency Contacted: 09/03/18 Time DME Agency Contacted: 0900 Representative spoke with at DME Agency: nathan            Social Determinants of Health ([place]) Interventions     Readmission Risk Interventions No flowsheet data found.

## 2018-09-04 NOTE — Discharge Summary (Signed)
Physician Discharge Summary  Patient ID: Kiara Miller MRN: [id number] DOB/AGE: 69-Oct-1952 69 y.o.  Admit date: 09/02/2018 Discharge date: 09/03/2018   Procedures:  Procedure(s) (LRB): TOTAL KNEE ARTHROPLASTY (Right)  Attending Physician:  Dr. Durene Romans   Admission Diagnoses:   Right knee primary OA / pain  Discharge Diagnoses:  Principal Problem:   S/P right TKA Active Problems:   Obese  Past Medical History:  Diagnosis Date  . Diverticulitis April 2017  . Hyperlipidemia   . Hypertension   . Joint pain   . Polyarthralgia   . Right knee pain     HPI:    Kiara Miller, 68 y.o. female, has a history of pain and functional disability in the right knee due to arthritis and has failed non-surgical conservative treatments for greater than 12 weeks to include NSAID's and/or analgesics, corticosteriod injections and activity modification.  Onset of symptoms was gradual, starting >10 years ago with gradually worsening course since that time. The patient noted prior procedures on the knee to include  arthroscopy on the right knee(s).  Patient currently rates pain in the right knee(s) at 8 out of 10 with activity. Patient has night pain, worsening of pain with activity and weight bearing, pain that interferes with activities of daily living, pain with passive range of motion, crepitus and joint swelling.  Patient has evidence of periarticular osteophytes and joint space narrowing by imaging studies.  There is no active infection.  Risks, benefits and expectations were discussed with the patient.  Risks including but not limited to the risk of anesthesia, blood clots, nerve damage, blood vessel damage, failure of the prosthesis, infection and up to and including death.  Patient understand the risks, benefits and expectations and wishes to proceed with surgery.   PCP: Tracey Harries, MD   Discharged Condition: good  Hospital Course:  Patient underwent the above stated procedure on  09/02/2018. Patient tolerated the procedure well and brought to the recovery room in good condition and subsequently to the floor.  POD #1 BP: 119/68 ; Pulse: 51 ; Temp: 97.8 F (36.6 C) ; Resp: 16 Patient reports pain as mild, pain controlled.  Patient states she has difficulty sleeping last night, otherwise no events.  We have discussed the procedure, expectations and plans moving forward.  Patient is ready to be discharged home. Dorsiflexion/plantar flexion intact, incision: dressing C/D/I, no cellulitis present and compartment soft.   LABS  Basename    HGB     12.8  HCT     38.6    Discharge Exam: General appearance: alert, cooperative and no distress Extremities: Homans sign is negative, no sign of DVT, no edema, redness or tenderness in the calves or thighs and no ulcers, gangrene or trophic changes  Disposition:  Home with follow up in 2 weeks   Follow-up Information    Durene Romans, MD. Schedule an appointment as soon as possible for a visit in 2 weeks.   Specialty: Orthopedic Surgery Contact information: [address] [place] [address] [phone]           Discharge Instructions    Call MD / Call 911   Complete by: As directed    If you experience chest pain or shortness of breath, CALL 911 and be transported to the hospital emergency room.  If you develope a fever above 101 F, pus (white drainage) or increased drainage or redness at the wound, or calf pain, call your surgeon's office.   Change  dressing   Complete by: As directed    Maintain surgical dressing until follow up in the clinic. If the edges start to pull up, may reinforce with tape. If the dressing is no longer working, may remove and cover with gauze and tape, but must keep the area dry and clean.  Call with any questions or concerns.   Constipation Prevention   Complete by: As directed    Drink plenty of fluids.  Prune juice may be helpful.  You may use a stool softener, such as  Colace (over the counter) 100 mg twice a day.  Use MiraLax (over the counter) for constipation as needed.   Diet - low sodium heart healthy   Complete by: As directed    Discharge instructions   Complete by: As directed    Maintain surgical dressing until follow up in the clinic. If the edges start to pull up, may reinforce with tape. If the dressing is no longer working, may remove and cover with gauze and tape, but must keep the area dry and clean.  Follow up in 2 weeks at [facility]. Call with any questions or concerns.   Increase activity slowly as tolerated   Complete by: As directed    Weight bearing as tolerated with assist device (walker, cane, etc) as directed, use it as long as suggested by your surgeon or therapist, typically at least 4-6 weeks.   TED hose   Complete by: As directed    Use stockings (TED hose) for 2 weeks on both leg(s).  You may remove them at night for sleeping.      Allergies as of 09/03/2018      Reactions   Clindamycin/lincomycin Other (See Comments)   Severe esophagitis   Codeine Itching   Leflunomide Nausea Only   Penicillins Itching   Did it involve swelling of the face/tongue/throat, SOB, or low BP? No Did it involve sudden or severe rash/hives, skin peeling, or any reaction on the inside of your mouth or nose? No Did you need to seek medical attention at a hospital or doctor's office? No When did it last happen?2010 If all above answers are "NO", may proceed with cephalosporin use.   Statins    Tetracyclines & Related Nausea And Vomiting   Levofloxacin Nausea Only, Palpitations      Medication List    STOP taking these medications   aspirin 81 MG tablet Replaced by: aspirin 81 MG chewable tablet     TAKE these medications   aspirin 81 MG chewable tablet Commonly known as: Aspirin Childrens Chew 1 tablet (81 mg total) by mouth 2 (two) times daily. Take for 4 weeks, then resume regular dose. Replaces: aspirin 81 MG  tablet   docusate sodium 100 MG capsule Commonly known as: Colace Take 1 capsule (100 mg total) by mouth 2 (two) times daily.   ferrous sulfate 325 (65 FE) MG tablet Commonly known as: FerrouSul Take 1 tablet (325 mg total) by mouth 3 (three) times daily with meals for 14 days.   Fish Oil 1200 MG Caps Take 1,200 mg by mouth 2 (two) times daily.   HYDROcodone-acetaminophen 7.5-325 MG tablet Commonly known as: Norco Take 1-2 tablets by mouth every 4 (four) hours as needed for moderate pain.   ketoconazole 2 % cream Commonly known as: NIZORAL Apply 1 application topically daily as needed for irritation.   levothyroxine 75 MCG tablet Commonly known as: SYNTHROID Take 75 mcg by mouth daily before breakfast.   losartan-hydrochlorothiazide  100-25 MG tablet Commonly known as: HYZAAR Take 1 tablet by mouth daily.   methocarbamol 500 MG tablet Commonly known as: Robaxin Take 1 tablet (500 mg total) by mouth every 6 (six) hours as needed for muscle spasms.   metoprolol succinate 25 MG 24 hr tablet Commonly known as: TOPROL-XL TAKE 1 TABLET DAILY   montelukast 10 MG tablet Commonly known as: SINGULAIR Take 10 mg by mouth at bedtime.   NON FORMULARY Place 33 mg under the tongue 2 (two) times a day. CBD Oil   polyethylene glycol 17 g packet Commonly known as: MIRALAX / GLYCOLAX Take 17 g by mouth 2 (two) times daily.   Vitamin D (Ergocalciferol) 1.25 MG (50000 UT) Caps capsule Commonly known as: DRISDOL Take 50,000 Units by mouth every Wednesday.            Discharge Care Instructions  (From admission, onward)         Start     Ordered   09/03/18 0000  Change dressing    Comments: Maintain surgical dressing until follow up in the clinic. If the edges start to pull up, may reinforce with tape. If the dressing is no longer working, may remove and cover with gauze and tape, but must keep the area dry and clean.  Call with any questions or concerns.   09/03/18 0831            Signed: Anastasio Auerbach. Estoria Geary   PA-C  09/04/2018, 9:49 AM

## 2018-09-05 ENCOUNTER — Ambulatory Visit: Payer: Medicare Other | Attending: Orthopedic Surgery | Admitting: Physical Therapy

## 2018-09-05 ENCOUNTER — Other Ambulatory Visit: Payer: Self-pay

## 2018-09-05 ENCOUNTER — Encounter: Payer: Self-pay | Admitting: Physical Therapy

## 2018-09-05 DIAGNOSIS — M25561 Pain in right knee: Secondary | ICD-10-CM | POA: Diagnosis not present

## 2018-09-05 DIAGNOSIS — R262 Difficulty in walking, not elsewhere classified: Secondary | ICD-10-CM | POA: Insufficient documentation

## 2018-09-05 DIAGNOSIS — M6281 Muscle weakness (generalized): Secondary | ICD-10-CM | POA: Diagnosis present

## 2018-09-05 DIAGNOSIS — M25661 Stiffness of right knee, not elsewhere classified: Secondary | ICD-10-CM | POA: Diagnosis present

## 2018-09-05 NOTE — Therapy (Signed)
[facility] [place], [place], [address] Phone: [phone]   Fax:  [phone]  Physical Therapy Evaluation  Patient Details  Name: Kiara Miller MRN: [id number] Date of Birth: May 22, 1950 Referring Provider (PT): Danae Orleans, PA-C   Encounter Date: 09/05/2018  PT End of Session - 09/05/18 1239    Visit Number  1    Number of Visits  12    Date for PT Re-Evaluation  10/10/18    PT Start Time  1030    PT Stop Time  1116    PT Time Calculation (min)  46 min    Activity Tolerance  Patient tolerated treatment well    Behavior During Therapy  [facility] for tasks assessed/performed       Past Medical History:  Diagnosis Date  . Diverticulitis April 2017  . Hyperlipidemia   . Hypertension   . Joint pain   . Polyarthralgia   . Right knee pain     Past Surgical History:  Procedure Laterality Date  . ABDOMINAL HYSTERECTOMY    . CARPAL TUNNEL RELEASE    . KNEE ARTHROSCOPY    . MANDIBLE SURGERY    . NM MYOVIEW LTD  06/2017   (Ordered for coronary calcium score 376) LOW RISK.  EF 72%.  No ischemia or infarction.  No wall motion normality.  No change from previous.  . OSTEOTOMY PELVIS BILATERAL    . TONSILLECTOMY    . TONSILLECTOMY    . 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 TRANSTHORACIC ECHOCARDIOGRAM  12/2010   EF 65-70%. Mild LVH. No RWMA/ Gr1 DD.  . TUBAL LIGATION    . WRIST FUSION      There were no vitals filed for this visit.   Subjective Assessment - 09/05/18 1236    Subjective  COVID-19 screening performed upon arrival. Patient arrives to physical therapy with reports of right knee pain, difficulty walking, and difficulty performing ADLs secondary to right TKA on 09/02/2018. Patient reported doing really well after coming from the hospital but the swelling set in and has  been causing her increased pain and less AROM. Patient requires assistance from her husband for lowerbody dressing and requires supervision for getting into the shower. Patient reports being compliant with HEP provided by hospital and with icing and elevating. Patient reports pain at worst 10/10 and pain at best 0/10 with rest and pain medication. Patient's goals are to decrease pain, improve movment, improve strength, improve ability to perform home activities, and walk without an AD.    Pertinent History  HTN, R TKA 09/02/2018, Rheumatoid Arthritis    Limitations  Sitting;Standing;Walking;House hold activities    Diagnostic tests  x-ray    Patient Stated Goals  get rid of pain, walk without walker    Currently in Pain?  Yes    Pain Score  7     Pain Location  Knee    Pain Orientation  Right    Pain Descriptors / Indicators  Aching;Sore    Pain Type  Surgical pain    Pain Onset  In the past 7 days    Pain Frequency  Constant    Aggravating Factors   movement    Pain Relieving Factors  medication, ice and elevation    Effect of Pain on Daily Activities  unable to peform LE dressing. supervision A  for getting into the shower         [facility] PT Assessment - 09/05/18 0001      Assessment   Medical Diagnosis  Unilateral primary osteoarthritis, right knee    Referring Provider (PT)  Lanney Gins, PA-C    Onset Date/Surgical Date  09/02/18    Next MD Visit  09/18/2018    Prior Therapy  no      Precautions   Precautions  Other (comment)    Precaution Comments  no ultrasound      Restrictions   Weight Bearing Restrictions  No      Balance Screen   Has the patient fallen in the past 6 months  No    Has the patient had a decrease in activity level because of a fear of falling?   No    Is the patient reluctant to leave their home because of a fear of falling?   No      Home Environment   Living Environment  Private residence    Living Arrangements  Spouse/significant other    Type of  Home  House    Home Access  Stairs to enter    Entrance Stairs-Number of Steps  2    Entrance Stairs-Rails  Right      Prior Function   Level of Independence  Independent with household mobility with device;Needs assistance with ADLs      Observation/Other Assessments   Skin Integrity  noted with ecchymosis throughout right knee    Focus on Therapeutic Outcomes (FOTO)   79% limited      Observation/Other Assessments-Edema    Edema  Circumferential      Circumferential Edema   Circumferential - Right  40 cm at mid patella    Circumferential - Left   37.5 cm at mid patella      ROM / Strength   AROM / PROM / Strength  AROM;PROM      AROM   AROM Assessment Site  Knee    Right/Left Knee  Right    Right Knee Extension  14    Right Knee Flexion  74      PROM   PROM Assessment Site  Knee    Right/Left Knee  Right    Right Knee Extension  12    Right Knee Flexion  86      Transfers   Comments  requires assist from left LE and hooks foot to perform bed mobility.      Ambulation/Gait   Assistive device  Rolling walker    Gait Pattern  Step-through pattern;Decreased step length - left;Decreased stance time - right;Decreased stride length;Decreased hip/knee flexion - right;Right flexed knee in stance;Antalgic;Trunk flexed    Ambulation Surface  Level;Unlevel                Objective measurements completed on examination: See above findings.      [facility] Adult PT Treatment/Exercise - 09/05/18 0001      Modalities   Modalities  Vasopneumatic      Vasopneumatic   Number Minutes Vasopneumatic   10 minutes    Vasopnuematic Location   Knee    Vasopneumatic Pressure  Low    Vasopneumatic Temperature   34             PT Education - 09/05/18 1238    Education Details  heel prop in sitting and supine, quad sets, prone hang, continue HEP provided by [employer]) Educated  Patient    Methods  Demonstration;Explanation;Handout    Comprehension  Verbalized  understanding;Returned demonstration       PT Short Term Goals - 09/05/18 1244      PT SHORT TERM GOAL #1   Title  Patient will be independent with HEP    Time  2    Period  Weeks    Status  New      PT SHORT TERM GOAL #2   Title  Patient will demonstrate 90+ degrees of right knee flexion AROM to improve ROM for functional tasks    Time  2    Period  Weeks    Status  New      PT SHORT TERM GOAL #3   Title  Patient will demonstrate 10 degrees or less of right knee extension AROM to improve gait mechanics.    Time  2    Period  Weeks    Status  New        PT Long Term Goals - 09/05/18 1245      PT LONG TERM GOAL #1   Title  Patient will be independent with advanced HEP    Time  4    Period  Weeks    Status  New      PT LONG TERM GOAL #2   Title  Patient will demonstrate 115+ degrees of right knee flexion AROM to improve ability to perform functoinal tasks    Time  4    Period  Weeks    Status  New      PT LONG TERM GOAL #3   Title  Patient will demonstrate 5 degrees or less of right knee extension AROM to improve gait mechanics.    Time  4    Period  Weeks    Status  New      PT LONG TERM GOAL #4   Title  Patient will report ability to ambulate front steps with 1 rail and reciprocating gait pattern to safely enter/exit home.    Time  4    Period  Weeks    Status  New      PT LONG TERM GOAL #5   Title  Patient will demonstrate 4+/5 or greater right knee MMT in all planes to improve stability during functional tasks.    Time  4    Period  Weeks    Status  New             Plan - 09/05/18 1239    Clinical Impression Statement  Patient is a 68 year old female who presents to physical therapy with right knee pain, difficulty walking, and decreased ROM secondary to a right TKA on 09/02/2018. Patient noted with increased edema in comparison to unaffected. Patient requires assistance from unaffected LE to raise right LE on/off the bed. Patient amulates with a  rolling walker with an antalgic gait patter, decreased right stance time, increased knee flexion during stance, and increased UE support on the walker. Patient is independent with hosptial HEP and was instructed to continue as well as add exercises provided today. Patient reported understanding. Patient would benefit from skilled physical therapy to address deficits and address patient's goals.    Personal Factors and Comorbidities  Age;Comorbidity 1    Comorbidities  HTN, R TKA 09/02/2018    Examination-Activity Limitations  Bed Mobility;Dressing;Stand;Stairs;Squat;Sit;Transfers;Locomotion Level    Examination-Participation Restrictions  Cleaning;Driving    Stability/Clinical Decision Making  Stable/Uncomplicated    Clinical Decision Making  Low    Rehab Potential  Good    PT Frequency  2x / week    PT Duration  6 weeks    PT Treatment/Interventions  ADLs/Self Care Home Management;Cryotherapy;Electrical Stimulation;Moist Heat;Gait training;Stair training;Functional mobility training;Therapeutic activities;Therapeutic exercise;Balance training;Passive range of motion;Patient/family education;Neuromuscular re-education;Manual techniques;Taping;Vasopneumatic Device    PT Next Visit Plan  Nustep, AROM and PROM of right knee modalities PRN for pain relief.    Consulted and Agree with Plan of Care  Patient       Patient will benefit from skilled therapeutic intervention in order to improve the following deficits and impairments:  Decreased activity tolerance, Decreased range of motion, Decreased strength, Difficulty walking, Increased edema, Pain  Visit Diagnosis: 1. Acute pain of right knee   2. Stiffness of right knee, not elsewhere classified   3. Difficulty in walking, not elsewhere classified   4. Muscle weakness (generalized)        Problem List Patient Active Problem List   Diagnosis Date Noted  . Obese 09/03/2018  . 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 PAC (premature atrial contraction)  07/11/2017  . History of hypothyroidism 06/11/2017  . Essential hypertension 06/11/2017  . History of obesity 06/11/2017  . S/P right knee arthroscopy 06/11/2017  . DDD (degenerative disc disease), cervical 06/11/2017  . DDD (degenerative disc disease), lumbar 06/11/2017  . History of carpal tunnel surgery of right wrist 06/11/2017  . Primary osteoarthritis of both knees 06/11/2017  . Chest pain, precordial 05/05/2017  . PVC's (premature ventricular contractions) 05/03/2017  . Family history of premature CAD 05/03/2017  . 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 Uveitis 10/10/2015  . Diverticulitis 10/10/2015  . Dyspnea 12/05/2010  . Palpitations 12/05/2010   Guss BundeKrystle Jesper Stirewalt, PT, DPT 09/05/2018, 12:58 PM  [facility] [address] [place], [place], [address] Phone: [phone]   Fax:  [phone]  Name: Kiara FinesLisa B Miller MRN: [id number] Date of Birth: 01/30/1951

## 2018-09-08 ENCOUNTER — Encounter: Payer: Self-pay | Admitting: Physical Therapy

## 2018-09-08 ENCOUNTER — Other Ambulatory Visit: Payer: Self-pay

## 2018-09-08 ENCOUNTER — Ambulatory Visit: Payer: Medicare Other | Attending: Orthopedic Surgery | Admitting: Physical Therapy

## 2018-09-08 DIAGNOSIS — M25561 Pain in right knee: Secondary | ICD-10-CM | POA: Diagnosis present

## 2018-09-08 DIAGNOSIS — M6281 Muscle weakness (generalized): Secondary | ICD-10-CM | POA: Insufficient documentation

## 2018-09-08 DIAGNOSIS — R262 Difficulty in walking, not elsewhere classified: Secondary | ICD-10-CM | POA: Diagnosis present

## 2018-09-08 DIAGNOSIS — M25661 Stiffness of right knee, not elsewhere classified: Secondary | ICD-10-CM | POA: Diagnosis present

## 2018-09-08 NOTE — Therapy (Signed)
[facility] [address] [place], [place], [address] Phone: [phone]   Fax:  [phone]  Physical Therapy Treatment  Patient Details  Name: Kiara Miller MRN: [id number] Date of Birth: February 25, 1950 Referring Provider (PT): Lanney Gins, PA-C   Encounter Date: 09/08/2018  PT End of Session - 09/08/18 1046    Visit Number  2    Number of Visits  12    Date for PT Re-Evaluation  10/10/18    PT Start Time  1041   late start due to vitals assessment   PT Stop Time  1135    PT Time Calculation (min)  54 min    Equipment Utilized During Treatment  Other (comment)   FWW   Activity Tolerance  Patient tolerated treatment well    Behavior During Therapy  [facility] for tasks assessed/performed       Past Medical History:  Diagnosis Date  . Diverticulitis April 2017  . Hyperlipidemia   . Hypertension   . Joint pain   . Polyarthralgia   . Right knee pain     Past Surgical History:  Procedure Laterality Date  . ABDOMINAL HYSTERECTOMY    . CARPAL TUNNEL RELEASE    . KNEE ARTHROSCOPY    . MANDIBLE SURGERY    . NM MYOVIEW LTD  06/2017   (Ordered for coronary calcium score 376) LOW RISK.  EF 72%.  No ischemia or infarction.  No wall motion normality.  No change from previous.  . OSTEOTOMY PELVIS BILATERAL    . TONSILLECTOMY    . TONSILLECTOMY    . TOTAL KNEE ARTHROPLASTY Right 09/02/2018   Procedure: TOTAL KNEE ARTHROPLASTY;  Surgeon: Durene Romans, MD;  Location: [facility];  Service: Orthopedics;  Laterality: Right;  70 mins  . TRANSTHORACIC ECHOCARDIOGRAM  12/2010   EF 65-70%. Mild LVH. No RWMA/ Gr1 DD.  . TUBAL LIGATION    . WRIST FUSION      There were no vitals filed for this visit.  Subjective Assessment - 09/08/18 1043    Subjective  COVID 19 screening performed on patient upon arrival. Patient reports SOB, lightheadedness for past 3 days at rest. Reports no calf tightness but does have increased edema in RLE. Tightness and  discomfort reported in R thigh. Reports that she has eaten prior to PT session and took half a pain pill at 2 am.    Pertinent History  HTN, R TKA 09/02/2018, Rheumatoid Arthritis    Limitations  Sitting;Standing;Walking;House hold activities    Diagnostic tests  x-ray    Patient Stated Goals  get rid of pain, walk without walker    Currently in Pain?  Yes    Pain Score  2     Pain Location  Knee    Pain Orientation  Right    Pain Descriptors / Indicators  Tightness;Discomfort    Pain Type  Surgical pain    Pain Onset  In the past 7 days    Pain Frequency  Constant         [facility] PT Assessment - 09/08/18 0001      Assessment   Medical Diagnosis  Unilateral primary osteoarthritis, right knee    Referring Provider (PT)  Lanney Gins, PA-C    Onset Date/Surgical Date  09/02/18    Next MD Visit  09/18/2018    Prior Therapy  no      Precautions   Precautions  Other (comment)    Precaution Comments  no ultrasound  Restrictions   Weight Bearing Restrictions  No                   [facility] Adult PT Treatment/Exercise - 09/08/18 0001      Exercises   Exercises  Knee/Hip      Knee/Hip Exercises: Aerobic   Nustep  L1, seat 9-8 x15 min      Modalities   Modalities  Electrical Stimulation;Vasopneumatic      Electrical Stimulation   Electrical Stimulation Location  L knee    Electrical Stimulation Action  IFC    Electrical Stimulation Parameters  80-150 hz x 14 min    Electrical Stimulation Goals  Pain;Edema      Vasopneumatic   Number Minutes Vasopneumatic   14 minutes    Vasopnuematic Location   Knee    Vasopneumatic Pressure  Medium    Vasopneumatic Temperature   34      Manual Therapy   Manual Therapy  Passive ROM    Passive ROM  PROM of L knee into flexion, extension with intermittant oscillations to reduce pain and muscle guarding               PT Short Term Goals - 09/05/18 1244      PT SHORT TERM GOAL #1   Title  Patient will be  independent with HEP    Time  2    Period  Weeks    Status  New      PT SHORT TERM GOAL #2   Title  Patient will demonstrate 90+ degrees of right knee flexion AROM to improve ROM for functional tasks    Time  2    Period  Weeks    Status  New      PT SHORT TERM GOAL #3   Title  Patient will demonstrate 10 degrees or less of right knee extension AROM to improve gait mechanics.    Time  2    Period  Weeks    Status  New        PT Long Term Goals - 09/05/18 1245      PT LONG TERM GOAL #1   Title  Patient will be independent with advanced HEP    Time  4    Period  Weeks    Status  New      PT LONG TERM GOAL #2   Title  Patient will demonstrate 115+ degrees of right knee flexion AROM to improve ability to perform functoinal tasks    Time  4    Period  Weeks    Status  New      PT LONG TERM GOAL #3   Title  Patient will demonstrate 5 degrees or less of right knee extension AROM to improve gait mechanics.    Time  4    Period  Weeks    Status  New      PT LONG TERM GOAL #4   Title  Patient will report ability to ambulate front steps with 1 rail and reciprocating gait pattern to safely enter/exit home.    Time  4    Period  Weeks    Status  New      PT LONG TERM GOAL #5   Title  Patient will demonstrate 4+/5 or greater right knee MMT in all planes to improve stability during functional tasks.    Time  4    Period  Weeks    Status  New  Plan - 09/08/18 1129    Clinical Impression Statement  Patient presented in clinic with reports of lightheadedness and SOB at rest that has been happening for around 3 days. Vitals were assessed and listed in today's note. Patient able to start some Nustep with no complaints of pain. Patient able to tolerate PROM flexion well but very tender with extension PROM. Frequent oscillations of R knee utilized to reduce pain and muscle guarding. AROM of R knee measured as 8-104 deg following PROM. Normal modaltieis response noted  although reported pain in R hip. Vitals of 131/69, 98%, 87 bpm.    Personal Factors and Comorbidities  Age;Comorbidity 1    Comorbidities  HTN, R TKA 09/02/2018    Examination-Activity Limitations  Bed Mobility;Dressing;Stand;Stairs;Squat;Sit;Transfers;Locomotion Level    Examination-Participation Restrictions  Cleaning;Driving    Stability/Clinical Decision Making  Stable/Uncomplicated    Rehab Potential  Good    PT Frequency  2x / week    PT Duration  6 weeks    PT Treatment/Interventions  ADLs/Self Care Home Management;Cryotherapy;Electrical Stimulation;Moist Heat;Gait training;Stair training;Functional mobility training;Therapeutic activities;Therapeutic exercise;Balance training;Passive range of motion;Patient/family education;Neuromuscular re-education;Manual techniques;Taping;Vasopneumatic Device    PT Next Visit Plan  Nustep, AROM and PROM of right knee modalities PRN for pain relief.    Consulted and Agree with Plan of Care  Patient       Patient will benefit from skilled therapeutic intervention in order to improve the following deficits and impairments:  Decreased activity tolerance, Decreased range of motion, Decreased strength, Difficulty walking, Increased edema, Pain  Visit Diagnosis: 1. Acute pain of right knee   2. Stiffness of right knee, not elsewhere classified   3. Difficulty in walking, not elsewhere classified   4. Muscle weakness (generalized)        Problem List Patient Active Problem List   Diagnosis Date Noted  . Obese 09/03/2018  . 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 PAC (premature atrial contraction) 07/11/2017  . History of hypothyroidism 06/11/2017  . Essential hypertension 06/11/2017  . History of obesity 06/11/2017  . S/P right knee arthroscopy 06/11/2017  . DDD (degenerative disc disease), cervical 06/11/2017  . DDD (degenerative disc disease), lumbar 06/11/2017  . History of carpal tunnel surgery of right wrist 06/11/2017  . Primary osteoarthritis of  both knees 06/11/2017  . Chest pain, precordial 05/05/2017  . PVC's (premature ventricular contractions) 05/03/2017  . Family history of premature CAD 05/03/2017  . Rheumatoid arthritis of multiple sites without rheumatoid factor ([place]) 10/10/2015  . Uveitis 10/10/2015  . Diverticulitis 10/10/2015  . Dyspnea 12/05/2010  . Palpitations 12/05/2010    Standley Brooking, PTA 09/08/2018, 11:48 AM  [facility] [address] [place], [place], [address] Phone: [phone]   Fax:  [phone]  Name: Kiara Miller MRN: [id number] Date of Birth: Apr 02, 1950

## 2018-09-10 ENCOUNTER — Ambulatory Visit: Payer: Medicare Other | Admitting: Physical Therapy

## 2018-09-12 ENCOUNTER — Encounter: Payer: Self-pay | Admitting: Physical Therapy

## 2018-09-12 ENCOUNTER — Other Ambulatory Visit: Payer: Self-pay

## 2018-09-12 ENCOUNTER — Ambulatory Visit: Payer: Medicare Other | Admitting: Physical Therapy

## 2018-09-12 DIAGNOSIS — R262 Difficulty in walking, not elsewhere classified: Secondary | ICD-10-CM

## 2018-09-12 DIAGNOSIS — M25561 Pain in right knee: Secondary | ICD-10-CM | POA: Diagnosis not present

## 2018-09-12 DIAGNOSIS — M6281 Muscle weakness (generalized): Secondary | ICD-10-CM

## 2018-09-12 DIAGNOSIS — M25661 Stiffness of right knee, not elsewhere classified: Secondary | ICD-10-CM

## 2018-09-12 NOTE — Therapy (Signed)
[facility] [address] [place], [place], [address] Phone: [phone]   Fax:  [phone]  Physical Therapy Treatment  Patient Details  Name: Kiara Miller MRN: [id number] Date of Birth: 01-20-1951 Referring Provider (PT): Lanney Gins, PA-C   Encounter Date: 09/12/2018  PT End of Session - 09/12/18 1214    Visit Number  3    Number of Visits  12    Date for PT Re-Evaluation  10/10/18    PT Start Time  1028    PT Stop Time  1128    PT Time Calculation (min)  60 min    Activity Tolerance  Patient tolerated treatment well    Behavior During Therapy  [facility] for tasks assessed/performed       Past Medical History:  Diagnosis Date  . Diverticulitis April 2017  . Hyperlipidemia   . Hypertension   . Joint pain   . Polyarthralgia   . Right knee pain     Past Surgical History:  Procedure Laterality Date  . ABDOMINAL HYSTERECTOMY    . CARPAL TUNNEL RELEASE    . KNEE ARTHROSCOPY    . MANDIBLE SURGERY    . NM MYOVIEW LTD  06/2017   (Ordered for coronary calcium score 376) LOW RISK.  EF 72%.  No ischemia or infarction.  No wall motion normality.  No change from previous.  . OSTEOTOMY PELVIS BILATERAL    . TONSILLECTOMY    . TONSILLECTOMY    . TOTAL KNEE ARTHROPLASTY Right 09/02/2018   Procedure: TOTAL KNEE ARTHROPLASTY;  Surgeon: Durene Romans, MD;  Location: [facility];  Service: Orthopedics;  Laterality: Right;  70 mins  . TRANSTHORACIC ECHOCARDIOGRAM  12/2010   EF 65-70%. Mild LVH. No RWMA/ Gr1 DD.  . TUBAL LIGATION    . WRIST FUSION      There were no vitals filed for this visit.  Subjective Assessment - 09/12/18 1030    Subjective  COVID 19 screening performed on patient upon arrival. Patient continues to report vertigo symptoms and feels as if she has lost all her post surgical improvments as she cannot lean forward or lay supine. Did get old PCP to prescribe a vertigo medication.    Pertinent History  HTN, R TKA 09/02/2018,  Rheumatoid Arthritis    Limitations  Sitting;Standing;Walking;House hold activities    Diagnostic tests  x-ray    Patient Stated Goals  get rid of pain, walk without walker    Currently in Pain?  Yes    Pain Score  6     Pain Location  Knee    Pain Orientation  Right    Pain Descriptors / Indicators  Discomfort;Tightness         [facility] PT Assessment - 09/12/18 0001      Assessment   Medical Diagnosis  Unilateral primary osteoarthritis, right knee    Referring Provider (PT)  Lanney Gins, PA-C    Onset Date/Surgical Date  09/02/18    Next MD Visit  09/18/2018    Prior Therapy  no      Precautions   Precautions  Other (comment)    Precaution Comments  no ultrasound      Restrictions   Weight Bearing Restrictions  No                   [facility] Adult PT Treatment/Exercise - 09/12/18 0001      Knee/Hip Exercises: Aerobic   Nustep  L3, seat 8 x16 min  Knee/Hip Exercises: Seated   Long Arc Quad  Other (comment)   Attempted but unable d/t lack of volitional contraction   Heel Slides  AROM;Right   x5 min total     Modalities   Modalities  Electrical Stimulation;Vasopneumatic   completed in sitting due to vertigo     Electrical Stimulation   Electrical Stimulation Location  L knee    Electrical Stimulation Action  IFC    Electrical Stimulation Parameters  80-150 hz x15 min    Electrical Stimulation Goals  Pain;Edema      Vasopneumatic   Number Minutes Vasopneumatic   15 minutes    Vasopnuematic Location   Knee    Vasopneumatic Pressure  Medium    Vasopneumatic Temperature   34      Manual Therapy   Manual Therapy  Soft tissue mobilization    Soft tissue mobilization  STW to R ITB, quads to reduce muscle tightness               PT Short Term Goals - 09/05/18 1244      PT SHORT TERM GOAL #1   Title  Patient will be independent with HEP    Time  2    Period  Weeks    Status  New      PT SHORT TERM GOAL #2   Title  Patient will  demonstrate 90+ degrees of right knee flexion AROM to improve ROM for functional tasks    Time  2    Period  Weeks    Status  New      PT SHORT TERM GOAL #3   Title  Patient will demonstrate 10 degrees or less of right knee extension AROM to improve gait mechanics.    Time  2    Period  Weeks    Status  New        PT Long Term Goals - 09/05/18 1245      PT LONG TERM GOAL #1   Title  Patient will be independent with advanced HEP    Time  4    Period  Weeks    Status  New      PT LONG TERM GOAL #2   Title  Patient will demonstrate 115+ degrees of right knee flexion AROM to improve ability to perform functoinal tasks    Time  4    Period  Weeks    Status  New      PT LONG TERM GOAL #3   Title  Patient will demonstrate 5 degrees or less of right knee extension AROM to improve gait mechanics.    Time  4    Period  Weeks    Status  New      PT LONG TERM GOAL #4   Title  Patient will report ability to ambulate front steps with 1 rail and reciprocating gait pattern to safely enter/exit home.    Time  4    Period  Weeks    Status  New      PT LONG TERM GOAL #5   Title  Patient will demonstrate 4+/5 or greater right knee MMT in all planes to improve stability during functional tasks.    Time  4    Period  Weeks    Status  New            Plan - 09/12/18 1119    Clinical Impression Statement  Today's PT session overall limited PT session due to vertigo  symptoms and unable to lay supine. Treatment completed in sitting to avoid vertigo symptoms. Patient educated verbally regarding modifications she can make to continue her HEP in sititng. STW completed to R ITB and quad to reduce discomfort and tightness. Normal modalities response noted following removal of the modalities.    Personal Factors and Comorbidities  Age;Comorbidity 1    Comorbidities  HTN, R TKA 09/02/2018    Examination-Activity Limitations  Bed Mobility;Dressing;Stand;Stairs;Squat;Sit;Transfers;Locomotion  Level    Examination-Participation Restrictions  Cleaning;Driving    Stability/Clinical Decision Making  Stable/Uncomplicated    Rehab Potential  Good    PT Frequency  2x / week    PT Duration  6 weeks    PT Treatment/Interventions  ADLs/Self Care Home Management;Cryotherapy;Electrical Stimulation;Moist Heat;Gait training;Stair training;Functional mobility training;Therapeutic activities;Therapeutic exercise;Balance training;Passive range of motion;Patient/family education;Neuromuscular re-education;Manual techniques;Taping;Vasopneumatic Device    PT Next Visit Plan  Nustep, AROM and PROM of right knee modalities PRN for pain relief.    Consulted and Agree with Plan of Care  Patient       Patient will benefit from skilled therapeutic intervention in order to improve the following deficits and impairments:  Decreased activity tolerance, Decreased range of motion, Decreased strength, Difficulty walking, Increased edema, Pain  Visit Diagnosis: 1. Acute pain of right knee   2. Stiffness of right knee, not elsewhere classified   3. Difficulty in walking, not elsewhere classified   4. Muscle weakness (generalized)        Problem List Patient Active Problem List   Diagnosis Date Noted  . Obese 09/03/2018  . 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 PAC (premature atrial contraction) 07/11/2017  . History of hypothyroidism 06/11/2017  . Essential hypertension 06/11/2017  . History of obesity 06/11/2017  . S/P right knee arthroscopy 06/11/2017  . DDD (degenerative disc disease), cervical 06/11/2017  . DDD (degenerative disc disease), lumbar 06/11/2017  . History of carpal tunnel surgery of right wrist 06/11/2017  . Primary osteoarthritis of both knees 06/11/2017  . Chest pain, precordial 05/05/2017  . PVC's (premature ventricular contractions) 05/03/2017  . Family history of premature CAD 05/03/2017  . Rheumatoid arthritis of multiple sites without rheumatoid factor ([place]) 10/10/2015  . Uveitis  10/10/2015  . Diverticulitis 10/10/2015  . Dyspnea 12/05/2010  . Palpitations 12/05/2010    Standley Brooking, PTA 09/12/2018, 12:19 PM  [facility] [address] [place], [place], [address] Phone: [phone]   Fax:  [phone]  Name: YOUNG BRIM MRN: [id number] Date of Birth: [birth year]

## 2018-09-15 ENCOUNTER — Encounter: Payer: Self-pay | Admitting: Physical Therapy

## 2018-09-15 ENCOUNTER — Ambulatory Visit: Payer: Medicare Other | Admitting: Physical Therapy

## 2018-09-15 ENCOUNTER — Other Ambulatory Visit: Payer: Self-pay

## 2018-09-15 DIAGNOSIS — R262 Difficulty in walking, not elsewhere classified: Secondary | ICD-10-CM

## 2018-09-15 DIAGNOSIS — M6281 Muscle weakness (generalized): Secondary | ICD-10-CM

## 2018-09-15 DIAGNOSIS — M25661 Stiffness of right knee, not elsewhere classified: Secondary | ICD-10-CM

## 2018-09-15 DIAGNOSIS — M25561 Pain in right knee: Secondary | ICD-10-CM

## 2018-09-15 NOTE — Therapy (Signed)
[facility] [address] [place], [place], [address] Phone: [phone]   Fax:  [phone]  Physical Therapy Treatment  Patient Details  Name: Kiara FinesLisa B Bomba MRN: [id number] Date of Birth: Oct 07, 1950 Referring Provider (PT): Lanney GinsMatthew Babish, PA-C   Encounter Date: 09/15/2018  PT End of Session - 09/15/18 0917    Visit Number  4    Number of Visits  12    Date for PT Re-Evaluation  10/10/18    PT Start Time  0900    PT Stop Time  0955    PT Time Calculation (min)  55 min    Activity Tolerance  Patient tolerated treatment well    Behavior During Therapy  [facility] for tasks assessed/performed       Past Medical History:  Diagnosis Date  . Diverticulitis April 2017  . Hyperlipidemia   . Hypertension   . Joint pain   . Polyarthralgia   . Right knee pain     Past Surgical History:  Procedure Laterality Date  . ABDOMINAL HYSTERECTOMY    . CARPAL TUNNEL RELEASE    . KNEE ARTHROSCOPY    . MANDIBLE SURGERY    . NM MYOVIEW LTD  06/2017   (Ordered for coronary calcium score 376) LOW RISK.  EF 72%.  No ischemia or infarction.  No wall motion normality.  No change from previous.  . OSTEOTOMY PELVIS BILATERAL    . TONSILLECTOMY    . TONSILLECTOMY    . TOTAL KNEE ARTHROPLASTY Right 09/02/2018   Procedure: TOTAL KNEE ARTHROPLASTY;  Surgeon: Durene Romanslin, Matthew, MD;  Location: [facility];  Service: Orthopedics;  Laterality: Right;  70 mins  . TRANSTHORACIC ECHOCARDIOGRAM  12/2010   EF 65-70%. Mild LVH. No RWMA/ Gr1 DD.  . TUBAL LIGATION    . WRIST FUSION      There were no vitals filed for this visit.  Subjective Assessment - 09/15/18 0900    Subjective  COVID 19 screening performed on patient upon arrival. Patient continues to report vertigo symptoms. Pt reporting 5/10 pain in R knee. Pt reporting pain up into her R hip.  Did get old PCP to prescribe a vertigo medication.    Pertinent History  HTN, R TKA 09/02/2018, Rheumatoid Arthritis    Currently in Pain?  Yes    Pain Score  5     Pain Location  Knee    Pain Orientation  Right    Pain Descriptors / Indicators  Aching;Tightness    Pain Type  Surgical pain;Acute pain    Pain Onset  In the past 7 days    Pain Frequency  Constant    Multiple Pain Sites  Yes    Pain Score  3    Pain Location  Hip    Pain Orientation  Right    Pain Descriptors / Indicators  Aching;Sore    Pain Type  Acute pain    Pain Frequency  Intermittent    Aggravating Factors   walking    Pain Relieving Factors  sitting resting                       [facility] Adult PT Treatment/Exercise - 09/15/18 0001      Knee/Hip Exercises: Aerobic   Nustep  L4, seat 8 x15 min      Knee/Hip Exercises: Seated   Long Arc Quad  AROM;Strengthening;Right;10 reps;Limitations   Pt with limited lift, (pt able to lift to 45 degrees sitting  Long [employer] Limitations  pt able to lift to 45 degree from resting on the floor, limited strengthe noted.     Heel Slides  AROM;Right   x5 min total     Modalities   Modalities  Electrical Stimulation;Vasopneumatic   completed in sitting due to vertigo     Electrical Stimulation   Electrical Stimulation Location  L knee    Electrical Stimulation Action  IFC    Electrical Stimulation Parameters  80-150 Hz x 15 minutes , intensity to tolerance    Electrical Stimulation Goals  Pain;Edema      Vasopneumatic   Number Minutes Vasopneumatic   15 minutes    Vasopnuematic Location   Knee    Vasopneumatic Pressure  Medium    Vasopneumatic Temperature   34      Manual Therapy   Manual Therapy  Soft tissue mobilization;Passive ROM    Soft tissue mobilization  STW to R ITB, quads to reduce muscle tightness    Passive ROM  knee flexion/extension with ocillations to reduce pain, mild overpressure in extension              PT Education - 09/15/18 1045    Education Details  Reviewed HEP    Person(s) Educated  Patient    Methods  Explanation    Comprehension   Verbalized understanding       PT Short Term Goals - 09/15/18 1034      PT SHORT TERM GOAL #1   Title  Patient will be independent with HEP    Time  2    Period  Weeks    Status  On-going      PT SHORT TERM GOAL #2   Title  Patient will demonstrate 90+ degrees of right knee flexion AROM to improve ROM for functional tasks    Time  2    Period  Weeks      PT SHORT TERM GOAL #3   Title  Patient will demonstrate 10 degrees or less of right knee extension AROM to improve gait mechanics.    Time  2    Period  Weeks    Status  On-going        PT Long Term Goals - 09/05/18 1245      PT LONG TERM GOAL #1   Title  Patient will be independent with advanced HEP    Time  4    Period  Weeks    Status  New      PT LONG TERM GOAL #2   Title  Patient will demonstrate 115+ degrees of right knee flexion AROM to improve ability to perform functoinal tasks    Time  4    Period  Weeks    Status  New      PT LONG TERM GOAL #3   Title  Patient will demonstrate 5 degrees or less of right knee extension AROM to improve gait mechanics.    Time  4    Period  Weeks    Status  New      PT LONG TERM GOAL #4   Title  Patient will report ability to ambulate front steps with 1 rail and reciprocating gait pattern to safely enter/exit home.    Time  4    Period  Weeks    Status  New      PT LONG TERM GOAL #5   Title  Patient will demonstrate 4+/5 or greater right knee MMT in all planes  to improve stability during functional tasks.    Time  4    Period  Weeks    Status  New            Plan - 09/15/18 3546    Clinical Impression Statement  Pt tolerating session well with exercises selected to accomodate pt's vertigo symptoms due to unable to lye supine. Pt with limited knee ROM and pain reported up into R hip. Pt still amb with step to gait pattern with RW. Pt instructed in heel to toe gait pattern.  STM perfomred during session. Pt reporting less pain in R knee and R hip at end of  session. Pt was able to tolerate Vaso and E-stim in supine with pillows to elevate to prevent pt from having a vertigo attack.    Personal Factors and Comorbidities  Age;Comorbidity 1    Comorbidities  HTN, R TKA 09/02/2018    Examination-Activity Limitations  Bed Mobility;Dressing;Stand;Stairs;Squat;Sit;Transfers;Locomotion Level    Examination-Participation Restrictions  Cleaning;Driving    Rehab Potential  Good    PT Frequency  2x / week    PT Duration  6 weeks    PT Treatment/Interventions  ADLs/Self Care Home Management;Cryotherapy;Electrical Stimulation;Moist Heat;Gait training;Stair training;Functional mobility training;Therapeutic activities;Therapeutic exercise;Balance training;Passive range of motion;Patient/family education;Neuromuscular re-education;Manual techniques;Taping;Vasopneumatic Device    PT Next Visit Plan  Nustep, AROM and PROM of right knee modalities PRN for pain relief.    Consulted and Agree with Plan of Care  Patient       Patient will benefit from skilled therapeutic intervention in order to improve the following deficits and impairments:  Decreased activity tolerance, Decreased range of motion, Decreased strength, Difficulty walking, Increased edema, Pain  Visit Diagnosis: 1. Stiffness of right knee, not elsewhere classified   2. Acute pain of right knee   3. Difficulty in walking, not elsewhere classified   4. Muscle weakness (generalized)        Problem List Patient Active Problem List   Diagnosis Date Noted  . Obese 09/03/2018  . 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 PAC (premature atrial contraction) 07/11/2017  . History of hypothyroidism 06/11/2017  . Essential hypertension 06/11/2017  . History of obesity 06/11/2017  . S/P right knee arthroscopy 06/11/2017  . DDD (degenerative disc disease), cervical 06/11/2017  . DDD (degenerative disc disease), lumbar 06/11/2017  . History of carpal tunnel surgery of right wrist 06/11/2017  . Primary osteoarthritis  of both knees 06/11/2017  . Chest pain, precordial 05/05/2017  . PVC's (premature ventricular contractions) 05/03/2017  . Family history of premature CAD 05/03/2017  . 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 Uveitis 10/10/2015  . Diverticulitis 10/10/2015  . Dyspnea 12/05/2010  . Palpitations 12/05/2010    Sharmon Leyden, PT 09/15/2018, 10:51 AM  [facility] [address] [place], [place], [address] Phone: [phone]   Fax:  [phone]  Name: KESHANNA RISO MRN: [id number] Date of Birth: [birth year]

## 2018-09-17 ENCOUNTER — Other Ambulatory Visit: Payer: Self-pay

## 2018-09-17 ENCOUNTER — Ambulatory Visit: Payer: Medicare Other | Admitting: Physical Therapy

## 2018-09-17 ENCOUNTER — Encounter: Payer: Self-pay | Admitting: Physical Therapy

## 2018-09-17 DIAGNOSIS — M25561 Pain in right knee: Secondary | ICD-10-CM | POA: Diagnosis not present

## 2018-09-17 DIAGNOSIS — M6281 Muscle weakness (generalized): Secondary | ICD-10-CM

## 2018-09-17 DIAGNOSIS — R262 Difficulty in walking, not elsewhere classified: Secondary | ICD-10-CM

## 2018-09-17 DIAGNOSIS — M25661 Stiffness of right knee, not elsewhere classified: Secondary | ICD-10-CM

## 2018-09-17 NOTE — Therapy (Signed)
[facility] [address] [place], [place], [address] Phone: [phone]   Fax:  [phone]  Physical Therapy Treatment  Patient Details  Name: Kiara Miller MRN: [id number] Date of Birth: November 24, 1950 Referring Provider (PT): Lanney Gins, PA-C   Encounter Date: 09/17/2018  PT End of Session - 09/17/18 1243    Visit Number  5    Number of Visits  12    Date for PT Re-Evaluation  10/10/18    PT Start Time  1115    PT Stop Time  1220    PT Time Calculation (min)  65 min       Past Medical History:  Diagnosis Date  . Diverticulitis April 2017  . Hyperlipidemia   . Hypertension   . Joint pain   . Polyarthralgia   . Right knee pain     Past Surgical History:  Procedure Laterality Date  . ABDOMINAL HYSTERECTOMY    . CARPAL TUNNEL RELEASE    . KNEE ARTHROSCOPY    . MANDIBLE SURGERY    . NM MYOVIEW LTD  06/2017   (Ordered for coronary calcium score 376) LOW RISK.  EF 72%.  No ischemia or infarction.  No wall motion normality.  No change from previous.  . OSTEOTOMY PELVIS BILATERAL    . TONSILLECTOMY    . TONSILLECTOMY    . TOTAL KNEE ARTHROPLASTY Right 09/02/2018   Procedure: TOTAL KNEE ARTHROPLASTY;  Surgeon: Durene Romans, MD;  Location: [facility];  Service: Orthopedics;  Laterality: Right;  70 mins  . TRANSTHORACIC ECHOCARDIOGRAM  12/2010   EF 65-70%. Mild LVH. No RWMA/ Gr1 DD.  . TUBAL LIGATION    . WRIST FUSION      There were no vitals filed for this visit.      [facility] PT Assessment - 09/17/18 0001      AROM   Right Knee Extension  -11    Right Knee Flexion  103      PROM   Right Knee Extension  -10    Right Knee Flexion  109                   [facility] Adult PT Treatment/Exercise - 09/17/18 0001      Exercises   Exercises  Knee/Hip      Knee/Hip Exercises: Aerobic   Nustep  Level 4 moving forward as tolerated x 15 minutes.      Knee/Hip Exercises: Supine   Short Arc Quad Sets Limitations  15  minutes SAQ's with 10 sec extension holds and 10 sec rest facilitated with [employer] conservator/restorer Stimulation Location  Left knee.   Medial.   Electrical Stimulation Action  IFC    Electrical Stimulation Parameters  80-150 Hz x 14 minutes.    Electrical Stimulation Goals  Edema;Pain      Vasopneumatic   Number Minutes Vasopneumatic   14 minutes    Vasopnuematic Location   --   Right knee.   Vasopneumatic Pressure  Medium               PT Short Term Goals - 09/15/18 1034      PT SHORT TERM GOAL #1   Title  Patient will be independent with HEP    Time  2    Period  Weeks    Status  On-going  PT SHORT TERM GOAL #2   Title  Patient will demonstrate 90+ degrees of right knee flexion AROM to improve ROM for functional tasks    Time  2    Period  Weeks      PT SHORT TERM GOAL #3   Title  Patient will demonstrate 10 degrees or less of right knee extension AROM to improve gait mechanics.    Time  2    Period  Weeks    Status  On-going        PT Long Term Goals - 09/05/18 1245      PT LONG TERM GOAL #1   Title  Patient will be independent with advanced HEP    Time  4    Period  Weeks    Status  New      PT LONG TERM GOAL #2   Title  Patient will demonstrate 115+ degrees of right knee flexion AROM to improve ability to perform functoinal tasks    Time  4    Period  Weeks    Status  New      PT LONG TERM GOAL #3   Title  Patient will demonstrate 5 degrees or less of right knee extension AROM to improve gait mechanics.    Time  4    Period  Weeks    Status  New      PT LONG TERM GOAL #4   Title  Patient will report ability to ambulate front steps with 1 rail and reciprocating gait pattern to safely enter/exit home.    Time  4    Period  Weeks    Status  New      PT LONG TERM GOAL #5   Title  Patient will demonstrate 4+/5 or greater  right knee MMT in all planes to improve stability during functional tasks.    Time  4    Period  Weeks    Status  New            Plan - 09/17/18 1246    Clinical Impression Statement  The patient id doing well with a notable improvement in right knee range of motion.  She is unfortunately having problems with vertigo.  She was very tender to palption over her Pes Anserine.    Comorbidities  HTN, R TKA 09/02/2018    Examination-Activity Limitations  Bed Mobility;Dressing;Stand;Stairs;Squat;Sit;Transfers;Locomotion Level    Examination-Participation Restrictions  Cleaning;Driving    Stability/Clinical Decision Making  Stable/Uncomplicated    Rehab Potential  Good    PT Frequency  2x / week    PT Duration  6 weeks    PT Treatment/Interventions  ADLs/Self Care Home Management;Cryotherapy;Electrical Stimulation;Moist Heat;Gait training;Stair training;Functional mobility training;Therapeutic activities;Therapeutic exercise;Balance training;Passive range of motion;Patient/family education;Neuromuscular re-education;Manual techniques;Taping;Vasopneumatic Device    PT Next Visit Plan  Nustep, AROM and PROM of right knee modalities PRN for pain relief.    Consulted and Agree with Plan of Care  Patient       Patient will benefit from skilled therapeutic intervention in order to improve the following deficits and impairments:  Decreased activity tolerance, Decreased range of motion, Decreased strength, Difficulty walking, Increased edema, Pain  Visit Diagnosis: 1. Stiffness of right knee, not elsewhere classified   2. Acute pain of right knee   3. Difficulty in walking, not elsewhere classified   4. Muscle weakness (generalized)        Problem List Patient Active Problem List   Diagnosis Date Noted  . Obese  09/03/2018  . 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 PAC (premature atrial contraction) 07/11/2017  . History of hypothyroidism 06/11/2017  . Essential hypertension 06/11/2017  . History of  obesity 06/11/2017  . S/P right knee arthroscopy 06/11/2017  . DDD (degenerative disc disease), cervical 06/11/2017  . DDD (degenerative disc disease), lumbar 06/11/2017  . History of carpal tunnel surgery of right wrist 06/11/2017  . Primary osteoarthritis of both knees 06/11/2017  . Chest pain, precordial 05/05/2017  . PVC's (premature ventricular contractions) 05/03/2017  . Family history of premature CAD 05/03/2017  . 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 Uveitis 10/10/2015  . Diverticulitis 10/10/2015  . Dyspnea 12/05/2010  . Palpitations 12/05/2010    [place], [place] MPT 09/17/2018, 12:57 PM  [facility] [address] [place], [place], [address] Phone: [phone]   Fax:  [phone]  Name: Kiara Miller MRN: [id number] Date of Birth: Mar 28, 1950

## 2018-09-19 ENCOUNTER — Encounter: Payer: Medicare Other | Admitting: *Deleted

## 2018-09-21 ENCOUNTER — Other Ambulatory Visit: Payer: Self-pay

## 2018-09-21 ENCOUNTER — Emergency Department (HOSPITAL_COMMUNITY)
Admission: EM | Admit: 2018-09-21 | Discharge: 2018-09-21 | Disposition: A | Discharge status: Home / Self Care | Payer: Medicare Other | Attending: Emergency Medicine | Admitting: Emergency Medicine

## 2018-09-21 ENCOUNTER — Encounter (HOSPITAL_COMMUNITY): Payer: Self-pay | Admitting: Emergency Medicine

## 2018-09-21 DIAGNOSIS — Z87891 Personal history of nicotine dependence: Secondary | ICD-10-CM | POA: Diagnosis not present

## 2018-09-21 DIAGNOSIS — Z96651 Presence of right artificial knee joint: Secondary | ICD-10-CM | POA: Insufficient documentation

## 2018-09-21 DIAGNOSIS — Z79899 Other long term (current) drug therapy: Secondary | ICD-10-CM | POA: Diagnosis not present

## 2018-09-21 DIAGNOSIS — R220 Localized swelling, mass and lump, head: Secondary | ICD-10-CM | POA: Insufficient documentation

## 2018-09-21 DIAGNOSIS — I10 Essential (primary) hypertension: Secondary | ICD-10-CM | POA: Insufficient documentation

## 2018-09-21 DIAGNOSIS — L509 Urticaria, unspecified: Secondary | ICD-10-CM | POA: Diagnosis not present

## 2018-09-21 LAB — CBC WITH DIFFERENTIAL/PLATELET
Abs Immature Granulocytes: 0.06 10*3/uL (ref 0.00–0.07)
Basophils Absolute: 0 10*3/uL (ref 0.0–0.1)
Basophils Relative: 0 %
Eosinophils Absolute: 0.1 10*3/uL (ref 0.0–0.5)
Eosinophils Relative: 1 %
HCT: 41.1 % (ref 36.0–46.0)
Hemoglobin: 13.7 g/dL (ref 12.0–15.0)
Immature Granulocytes: 1 %
Lymphocytes Relative: 9 %
Lymphs Abs: 1 10*3/uL (ref 0.7–4.0)
MCH: 30.9 pg (ref 26.0–34.0)
MCHC: 33.3 g/dL (ref 30.0–36.0)
MCV: 92.8 fL (ref 80.0–100.0)
Monocytes Absolute: 0.6 10*3/uL (ref 0.1–1.0)
Monocytes Relative: 5 %
Neutro Abs: 9.2 10*3/uL — ABNORMAL HIGH (ref 1.7–7.7)
Neutrophils Relative %: 84 %
Platelets: 214 10*3/uL (ref 150–400)
RBC: 4.43 MIL/uL (ref 3.87–5.11)
RDW: 13.3 % (ref 11.5–15.5)
WBC: 10.9 10*3/uL — ABNORMAL HIGH (ref 4.0–10.5)
nRBC: 0 % (ref 0.0–0.2)

## 2018-09-21 LAB — COMPREHENSIVE METABOLIC PANEL
ALT: 20 U/L (ref 0–44)
AST: 21 U/L (ref 15–41)
Albumin: 4.1 g/dL (ref 3.5–5.0)
Alkaline Phosphatase: 50 U/L (ref 38–126)
Anion gap: 10 (ref 5–15)
BUN: 26 mg/dL — ABNORMAL HIGH (ref 8–23)
CO2: 26 mmol/L (ref 22–32)
Calcium: 9.4 mg/dL (ref 8.9–10.3)
Chloride: 104 mmol/L (ref 98–111)
Creatinine, Ser: 0.75 mg/dL (ref 0.44–1.00)
GFR calc Af Amer: >60 mL/min (ref >60)
GFR calc non Af Amer: >60 mL/min (ref >60)
Glucose, Bld: 115 mg/dL — ABNORMAL HIGH (ref 70–99)
Potassium: 3.7 mmol/L (ref 3.5–5.1)
Sodium: 140 mmol/L (ref 135–145)
Total Bilirubin: 0.9 mg/dL (ref 0.3–1.2)
Total Protein: 7.3 g/dL (ref 6.5–8.1)

## 2018-09-21 MED ORDER — FAMOTIDINE 20 MG PO TABS: 20.0000 mg | ORAL_TABLET | Freq: Two times a day (BID) | ORAL | 0 refills | Status: DC

## 2018-09-21 MED ORDER — FAMOTIDINE 20 MG PO TABS
20.0000 mg | ORAL_TABLET | Freq: Once | ORAL | Status: AC
  Administered 2018-09-21: 12:00:00 20 mg via ORAL
  Filled 2018-09-21: qty 1

## 2018-09-21 NOTE — ED Notes (Signed)
Rash to face, trunk, legs and back.  Denies pain but c/o itching.

## 2018-09-21 NOTE — ED Triage Notes (Signed)
Patient c/o hives with swelling to left eye. Per patient went to Urgent Care on Thursday due to Hives and was given a IM injection of steroid and discharge with a 3 day prescription to of prednisone and vistaril. Patient states that hives returned this morning with the swelling to the eye. Denies any new medications, food, lotions, soaps, etc. Patient is 2 weeks postop from knee surgery and states that she has been dizzy as well prior to the hives starting. Denies any shortness of breath or swelling of her tongue or throat. Patient does state "tickle in her throat and chills."

## 2018-09-21 NOTE — Discharge Instructions (Addendum)
Continue to stay off of the hydrocodone.  Make an appointment to follow-up with dermatology.  Continue your prednisone taper.  Switch to Benadryl 25 mg every 6 hours for the next several days.  Start the Pepcid 20 mg twice a day for the next 7 days.  Return for any new or worse symptoms fevers tongue swelling significant lip swelling trouble breathing.  Today's labs without significant abnormalities.

## 2018-09-21 NOTE — ED Provider Notes (Signed)
[facility] EMERGENCY DEPARTMENT Provider Note   CSN: [id number] Arrival date & time: 09/21/18  8416     History   Chief Complaint Chief Complaint  Patient presents with  . Urticaria    HPI Kiara Miller is a 68 y.o. female.     Patient presenting with persistent rash.  Patient went to urgent care on Thursday due to hives.  Patient being treated with steroids and Atarax is on a steroid taper.  Patient states the hives returned this morning with swelling around the eyes but no visual problems.  The rash is itchy.  Made it difficult to sleep.  No new soaps or lotions.  The only new medication has been hydrocodone status post knee surgery but she takes it rarely.  But she stopped it 24 hours ago.  Denies any trouble swallowing any tongue swelling or any significant lip swelling.  She has had some chills but no fever.  No nausea or vomiting.  No history of problem like this before.  No blood in her urine.  Has also been experiencing some dizziness and vertigo but that is been going on for several weeks.  Patient had knee replacement surgery several weeks ago.  Patient does take meclizine for the vertigo but stopped taking because it did not seem to be helping much.  Her primary care doctor is aware of this and they are following her for this.     Past Medical History:  Diagnosis Date  . Diverticulitis April 2017  . Hyperlipidemia   . Hypertension   . Joint pain   . Polyarthralgia   . Right knee pain     Patient Active Problem List   Diagnosis Date Noted  . Obese 09/03/2018  . 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 PAC (premature atrial contraction) 07/11/2017  . History of hypothyroidism 06/11/2017  . Essential hypertension 06/11/2017  . History of obesity 06/11/2017  . S/P right knee arthroscopy 06/11/2017  . DDD (degenerative disc disease), cervical 06/11/2017  . DDD (degenerative disc disease), lumbar 06/11/2017  . History of carpal tunnel surgery of right wrist 06/11/2017  .  Primary osteoarthritis of both knees 06/11/2017  . Chest pain, precordial 05/05/2017  . PVC's (premature ventricular contractions) 05/03/2017  . Family history of premature CAD 05/03/2017  . 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 Uveitis 10/10/2015  . Diverticulitis 10/10/2015  . Dyspnea 12/05/2010  . Palpitations 12/05/2010    Past Surgical History:  Procedure Laterality Date  . ABDOMINAL HYSTERECTOMY    . CARPAL TUNNEL RELEASE    . MANDIBLE SURGERY    . NM MYOVIEW LTD  06/2017   (Ordered for coronary calcium score 376) LOW RISK.  EF 72%.  No ischemia or infarction.  No wall motion normality.  No change from previous.  . OSTEOTOMY PELVIS BILATERAL    . TONSILLECTOMY    . TONSILLECTOMY    . TOTAL KNEE ARTHROPLASTY Right 09/02/2018   Procedure: TOTAL KNEE ARTHROPLASTY;  Surgeon: Durene Romans, MD;  Location: [facility];  Service: Orthopedics;  Laterality: Right;  70 mins  . TRANSTHORACIC ECHOCARDIOGRAM  12/2010   EF 65-70%. Mild LVH. No RWMA/ Gr1 DD.  . TUBAL LIGATION    . WRIST FUSION       OB History   No obstetric history on file.      Home Medications    Prior to Admission medications   Medication Sig Start Date End Date Taking? Authorizing Provider  aspirin (ASPIRIN  CHILDRENS) 81 MG chewable tablet Chew 1 tablet (81 mg total) by mouth 2 (two) times daily. Take for 4 weeks, then resume regular dose. 09/03/18 10/03/18  Lanney Gins, PA-C  docusate sodium (COLACE) 100 MG capsule Take 1 capsule (100 mg total) by mouth 2 (two) times daily. 09/03/18   Lanney Gins, PA-C  famotidine (PEPCID) 20 MG tablet Take 1 tablet (20 mg total) by mouth 2 (two) times daily. 09/21/18   Vanetta Mulders, MD  ferrous sulfate (FERROUSUL) 325 (65 FE) MG tablet Take 1 tablet (325 mg total) by mouth 3 (three) times daily with meals for 14 days. 09/03/18 09/17/18  Lanney Gins, PA-C  HYDROcodone-acetaminophen (NORCO) 7.5-325 MG tablet Take 1-2 tablets by  mouth every 4 (four) hours as needed for moderate pain. 09/03/18   Lanney Gins, PA-C  ketoconazole (NIZORAL) 2 % cream Apply 1 application topically daily as needed for irritation.    [provider]  levothyroxine (SYNTHROID, LEVOTHROID) 75 MCG tablet Take 75 mcg by mouth daily before breakfast.    [provider]  losartan-hydrochlorothiazide (HYZAAR) 100-25 MG per tablet Take 1 tablet by mouth daily. 12/05/10   Nahser, Deloris Ping, MD  methocarbamol (ROBAXIN) 500 MG tablet Take 1 tablet (500 mg total) by mouth every 6 (six) hours as needed for muscle spasms. 09/03/18   Lanney Gins, PA-C  metoprolol succinate (TOPROL-XL) 25 MG 24 hr tablet TAKE 1 TABLET DAILY Patient taking differently: Take 25 mg by mouth daily.  07/01/18   Marykay Lex, MD  montelukast (SINGULAIR) 10 MG tablet Take 10 mg by mouth at bedtime.      [provider]  NON FORMULARY Place 33 mg under the tongue 2 (two) times a day. CBD Oil    [provider]  Omega-3 Fatty Acids (FISH OIL) 1200 MG CAPS Take 1,200 mg by mouth 2 (two) times daily.      [provider]  polyethylene glycol (MIRALAX / GLYCOLAX) 17 g packet Take 17 g by mouth 2 (two) times daily. 09/03/18   Lanney Gins, PA-C  Vitamin D, Ergocalciferol, (DRISDOL) 50000 UNITS CAPS capsule Take 50,000 Units by mouth every Wednesday.     [provider]    Family History Family History  Problem Relation Age of Onset  . Hypertension Father   . Lung disease Brother     Social History Social History   Tobacco Use  . Smoking status: Former Smoker    Packs/day: 0.50    Years: 48.00    Pack years: 24.00    Types: Cigarettes    Quit date: 08/18/2018    Years since quitting: 0.0  . Smokeless tobacco: Never Used  Substance Use Topics  . Alcohol use: No  . Drug use: No     Allergies   Clindamycin/lincomycin, Codeine, Leflunomide, Penicillins, Statins, Tetracyclines & related, and Levofloxacin    Review of Systems Review of Systems  Constitutional: Negative for chills and fever.  HENT: Negative for congestion, rhinorrhea, sore throat and trouble swallowing.   Eyes: Negative for photophobia, pain, redness and visual disturbance.  Respiratory: Negative for cough and shortness of breath.   Cardiovascular: Negative for chest pain and leg swelling.  Gastrointestinal: Negative for abdominal pain, diarrhea, nausea and vomiting.  Genitourinary: Negative for dysuria.  Musculoskeletal: Negative for back pain and neck pain.  Skin: Positive for rash.  Neurological: Negative for dizziness, light-headedness and headaches.  Hematological: Does not bruise/bleed easily.  Psychiatric/Behavioral: Negative for confusion.     Physical Exam Updated  Vital Signs BP (!) 150/71 (BP Location: Left Arm)   Pulse (!) 58   Temp 98.2 F (36.8 C) (Oral)   Resp 18   Ht 1.562 m (5' 1.5")   Wt 77.1 kg   SpO2 98%   BMI 31.60 kg/m   Physical Exam Vitals signs and nursing note reviewed.  Constitutional:      General: She is not in acute distress.    Appearance: Normal appearance. She is well-developed.  HENT:     Head: Normocephalic and atraumatic.     Mouth/Throat:     Mouth: Mucous membranes are moist.     Pharynx: Oropharynx is clear.     Comments: No tongue or lip swelling. Eyes:     Extraocular Movements: Extraocular movements intact.     Conjunctiva/sclera: Conjunctivae normal.     Pupils: Pupils are equal, round, and reactive to light.     Comments: Anterior chambers are normal sclera clear  Neck:     Musculoskeletal: Normal range of motion and neck supple.  Cardiovascular:     Rate and Rhythm: Normal rate and regular rhythm.     Heart sounds: No murmur.  Pulmonary:     Effort: Pulmonary effort is normal. No respiratory distress.     Breath sounds: Normal breath sounds.  Abdominal:     Palpations: Abdomen is soft.     Tenderness: There is no abdominal tenderness.  Musculoskeletal:  Normal range of motion.  Skin:    General: Skin is warm and dry.     Capillary Refill: Capillary refill takes less than 2 seconds.     Findings: Rash present.     Comments: Patient with erythematous rash hive-like in nature raised.  Maybe a rare target lesion but not very common.  Rash involves face up into the scalp.  Around both eyes.  Both upper extremities trunk minimal on the lower extremities.  It does blanch.  Neurological:     General: No focal deficit present.     Mental Status: She is alert and oriented to person, place, and time.      ED Treatments / Results  Labs (all labs ordered are listed, but only abnormal results are displayed) Labs Reviewed  CBC WITH DIFFERENTIAL/PLATELET - Abnormal; Notable for the following components:      Result Value   WBC 10.9 (*)    Neutro Abs 9.2 (*)    All other components within normal limits  COMPREHENSIVE METABOLIC PANEL - Abnormal; Notable for the following components:   Glucose, Bld 115 (*)    BUN 26 (*)    All other components within normal limits    EKG None  Radiology No results found.  Procedures Procedures (including critical care time)  Medications Ordered in ED Medications  famotidine (PEPCID) tablet 20 mg (20 mg Oral Given 09/21/18 1144)     Initial Impression / Assessment and Plan / ED Course  I have reviewed the triage vital signs and the nursing notes.  Pertinent labs & imaging results that were available during my care of the patient were reviewed by me and considered in my medical decision making (see chart for details).       Patient with recurrent him and persistent and fairly significant urticaria.  Some coalescing of the hives and the giant hives.  But no tongue swelling no lip swelling no breathing difficulties.  Lab work shows no systemic abnormalities.  Patient nontoxic no acute distress peer exact cause is not clear.  Patient has  stopped her hydrocodone we will have her continue that.  Patient is  followed by dermatology.  We will have her follow-up with dermatology this week.  We will change her medicines around some we will have her start the Benadryl she is already on a prednisone taper.  We will have her start Pepcid.  We will have her stop the Atarax.  And she will be stopping the hydrocodone.  Exact etiology of the hives is not clear may not be allergic in nature.  May require referral to allergy specialist.  Nontoxic no acute distress.  Patient treated with Pepcid here without any real change but certainly not worse.   Final Clinical Impressions(s) / ED Diagnoses   Final diagnoses:  Hives    ED Discharge Orders         Ordered    famotidine (PEPCID) 20 MG tablet  2 times daily     09/21/18 1424           Fredia Sorrow, MD 09/22/18 1538

## 2018-09-22 ENCOUNTER — Ambulatory Visit: Payer: Medicare Other | Admitting: Physical Therapy

## 2018-09-23 ENCOUNTER — Encounter: Payer: Self-pay | Admitting: Rheumatology

## 2018-09-24 ENCOUNTER — Encounter: Payer: Medicare Other | Admitting: Physical Therapy

## 2018-09-25 ENCOUNTER — Encounter: Payer: Self-pay | Admitting: Physical Therapy

## 2018-09-25 ENCOUNTER — Ambulatory Visit: Payer: Medicare Other | Admitting: Physical Therapy

## 2018-09-25 ENCOUNTER — Other Ambulatory Visit: Payer: Self-pay

## 2018-09-25 DIAGNOSIS — M6281 Muscle weakness (generalized): Secondary | ICD-10-CM

## 2018-09-25 DIAGNOSIS — R262 Difficulty in walking, not elsewhere classified: Secondary | ICD-10-CM

## 2018-09-25 DIAGNOSIS — M25661 Stiffness of right knee, not elsewhere classified: Secondary | ICD-10-CM

## 2018-09-25 DIAGNOSIS — M25561 Pain in right knee: Secondary | ICD-10-CM | POA: Diagnosis not present

## 2018-09-25 NOTE — Therapy (Signed)
[facility] [address] [place], [place], [address] Phone: [phone]   Fax:  [phone]  Physical Therapy Treatment  Patient Details  Name: Kiara Miller MRN: [id number] Date of Birth: 1950-04-28 Referring Provider (PT): Lanney Gins, PA-C   Encounter Date: 09/25/2018  PT End of Session - 09/25/18 1534    Visit Number  6    Number of Visits  12    Date for PT Re-Evaluation  10/10/18    PT Start Time  0230    PT Stop Time  0329    PT Time Calculation (min)  59 min    Activity Tolerance  Patient tolerated treatment well    Behavior During Therapy  [facility] for tasks assessed/performed       Past Medical History:  Diagnosis Date  . Diverticulitis April 2017  . Hyperlipidemia   . Hypertension   . Joint pain   . Polyarthralgia   . Right knee pain     Past Surgical History:  Procedure Laterality Date  . ABDOMINAL HYSTERECTOMY    . CARPAL TUNNEL RELEASE    . MANDIBLE SURGERY    . NM MYOVIEW LTD  06/2017   (Ordered for coronary calcium score 376) LOW RISK.  EF 72%.  No ischemia or infarction.  No wall motion normality.  No change from previous.  . OSTEOTOMY PELVIS BILATERAL    . TONSILLECTOMY    . TONSILLECTOMY    . TOTAL KNEE ARTHROPLASTY Right 09/02/2018   Procedure: TOTAL KNEE ARTHROPLASTY;  Surgeon: Durene Romans, MD;  Location: [facility];  Service: Orthopedics;  Laterality: Right;  70 mins  . TRANSTHORACIC ECHOCARDIOGRAM  12/2010   EF 65-70%. Mild LVH. No RWMA/ Gr1 DD.  . TUBAL LIGATION    . WRIST FUSION      There were no vitals filed for this visit.  Subjective Assessment - 09/25/18 1514    Subjective  COVID-19 screen performed prior to patient entering clinic.  My hives started breaking yesterday.  Got Aquacel removed from knee.    Pertinent History  HTN, R TKA 09/02/2018, Rheumatoid Arthritis    Currently in Pain?  Yes    Pain Score  5     Pain Location  Knee    Pain Orientation  Right    Pain Descriptors /  Indicators  Aching    Pain Type  Surgical pain    Pain Onset  1 to 4 weeks ago         [facility] PT Assessment - 09/25/18 0001      AROM   Right Knee Flexion  110                   [facility] Adult PT Treatment/Exercise - 09/25/18 0001      Exercises   Exercises  Knee/Hip      Knee/Hip Exercises: Aerobic   Nustep  Level 4 moving forward as tolerated to increase knee flexion x 15 minutes.      Programme researcher, broadcasting/film/video Location  --   Right knee.   Electrical Stimulation Action  IFC    Electrical Stimulation Parameters  80-150 Hz x 20 minutes.    Electrical Stimulation Goals  Edema;Pain      Vasopneumatic   Number Minutes Vasopneumatic   20 minutes    Vasopnuematic Location   --   Right knee.   Vasopneumatic Pressure  Low      Manual Therapy   Manual Therapy  Passive ROM  Passive ROM  Right knee PROM into flexion and extension and patellar mobs x 8 minutes.               PT Short Term Goals - 09/15/18 1034      PT SHORT TERM GOAL #1   Title  Patient will be independent with HEP    Time  2    Period  Weeks    Status  On-going      PT SHORT TERM GOAL #2   Title  Patient will demonstrate 90+ degrees of right knee flexion AROM to improve ROM for functional tasks    Time  2    Period  Weeks      PT SHORT TERM GOAL #3   Title  Patient will demonstrate 10 degrees or less of right knee extension AROM to improve gait mechanics.    Time  2    Period  Weeks    Status  On-going        PT Long Term Goals - 09/05/18 1245      PT LONG TERM GOAL #1   Title  Patient will be independent with advanced HEP    Time  4    Period  Weeks    Status  New      PT LONG TERM GOAL #2   Title  Patient will demonstrate 115+ degrees of right knee flexion AROM to improve ability to perform functoinal tasks    Time  4    Period  Weeks    Status  New      PT LONG TERM GOAL #3   Title  Patient will demonstrate 5 degrees or less of right knee  extension AROM to improve gait mechanics.    Time  4    Period  Weeks    Status  New      PT LONG TERM GOAL #4   Title  Patient will report ability to ambulate front steps with 1 rail and reciprocating gait pattern to safely enter/exit home.    Time  4    Period  Weeks    Status  New      PT LONG TERM GOAL #5   Title  Patient will demonstrate 4+/5 or greater right knee MMT in all planes to improve stability during functional tasks.    Time  4    Period  Weeks    Status  New            Plan - 09/25/18 1526    Clinical Impression Statement  Patient doing much better today.  Her active right knee flexion is 110 degrees today.  She tolerated treatment without complaint.  Normal modality response.    Personal Factors and Comorbidities  Age;Comorbidity 1    Comorbidities  HTN, R TKA 09/02/2018    Examination-Activity Limitations  Bed Mobility;Dressing;Stand;Stairs;Squat;Sit;Transfers;Locomotion Level    Stability/Clinical Decision Making  Stable/Uncomplicated    Rehab Potential  Good    PT Frequency  2x / week    PT Duration  6 weeks    PT Treatment/Interventions  ADLs/Self Care Home Management;Cryotherapy;Electrical Stimulation;Moist Heat;Gait training;Stair training;Functional mobility training;Therapeutic activities;Therapeutic exercise;Balance training;Passive range of motion;Patient/family education;Neuromuscular re-education;Manual techniques;Taping;Vasopneumatic Device    PT Next Visit Plan  Okay to start strenthening exercises and stationary bike.    Consulted and Agree with Plan of Care  Patient       Patient will benefit from skilled therapeutic intervention in order to improve the following deficits and impairments:  Decreased activity tolerance, Decreased range of motion, Decreased strength, Difficulty walking, Increased edema, Pain  Visit Diagnosis: Stiffness of right knee, not elsewhere classified  Acute pain of right knee  Difficulty in walking, not elsewhere  classified  Muscle weakness (generalized)     Problem List Patient Active Problem List   Diagnosis Date Noted  . Obese 09/03/2018  . 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 PAC (premature atrial contraction) 07/11/2017  . History of hypothyroidism 06/11/2017  . Essential hypertension 06/11/2017  . History of obesity 06/11/2017  . S/P right knee arthroscopy 06/11/2017  . DDD (degenerative disc disease), cervical 06/11/2017  . DDD (degenerative disc disease), lumbar 06/11/2017  . History of carpal tunnel surgery of right wrist 06/11/2017  . Primary osteoarthritis of both knees 06/11/2017  . Chest pain, precordial 05/05/2017  . PVC's (premature ventricular contractions) 05/03/2017  . Family history of premature CAD 05/03/2017  . 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 Uveitis 10/10/2015  . Diverticulitis 10/10/2015  . Dyspnea 12/05/2010  . Palpitations 12/05/2010    Ahmari Garton, [place] MPT 09/25/2018, 3:35 PM  [facility] [address] [place], [place], [id number] Phone: [phone]   Fax:  [phone]  Name: Kiara FinesLisa B Miller MRN: [id number] Date of Birth: 04-23-1950

## 2018-09-26 ENCOUNTER — Ambulatory Visit: Payer: Medicare Other | Admitting: Physical Therapy

## 2018-09-26 ENCOUNTER — Other Ambulatory Visit: Payer: Self-pay

## 2018-09-26 ENCOUNTER — Encounter: Payer: Self-pay | Admitting: Physical Therapy

## 2018-09-26 DIAGNOSIS — R262 Difficulty in walking, not elsewhere classified: Secondary | ICD-10-CM

## 2018-09-26 DIAGNOSIS — M25561 Pain in right knee: Secondary | ICD-10-CM | POA: Diagnosis not present

## 2018-09-26 DIAGNOSIS — M6281 Muscle weakness (generalized): Secondary | ICD-10-CM

## 2018-09-26 DIAGNOSIS — M25661 Stiffness of right knee, not elsewhere classified: Secondary | ICD-10-CM

## 2018-09-26 NOTE — Therapy (Signed)
[facility] [address] [place], [place], [address] Phone: [phone]   Fax:  [phone]  Physical Therapy Treatment  Patient Details  Name: Kiara Miller MRN: [id number] Date of Birth: 24-Dec-1950 Referring Provider (PT): Lanney Gins, PA-C/ Charlann Boxer, MD   Encounter Date: 09/26/2018  PT End of Session - 09/26/18 1051    Visit Number  7    Number of Visits  12    Date for PT Re-Evaluation  10/10/18    PT Start Time  1030    PT Stop Time  1128    PT Time Calculation (min)  58 min    Activity Tolerance  Patient tolerated treatment well    Behavior During Therapy  [facility] for tasks assessed/performed       Past Medical History:  Diagnosis Date  . Diverticulitis April 2017  . Hyperlipidemia   . Hypertension   . Joint pain   . Polyarthralgia   . Right knee pain     Past Surgical History:  Procedure Laterality Date  . ABDOMINAL HYSTERECTOMY    . CARPAL TUNNEL RELEASE    . MANDIBLE SURGERY    . NM MYOVIEW LTD  06/2017   (Ordered for coronary calcium score 376) LOW RISK.  EF 72%.  No ischemia or infarction.  No wall motion normality.  No change from previous.  . OSTEOTOMY PELVIS BILATERAL    . TONSILLECTOMY    . TONSILLECTOMY    . TOTAL KNEE ARTHROPLASTY Right 09/02/2018   Procedure: TOTAL KNEE ARTHROPLASTY;  Surgeon: Durene Romans, MD;  Location: [facility];  Service: Orthopedics;  Laterality: Right;  70 mins  . TRANSTHORACIC ECHOCARDIOGRAM  12/2010   EF 65-70%. Mild LVH. No RWMA/ Gr1 DD.  . TUBAL LIGATION    . WRIST FUSION      There were no vitals filed for this visit.  Subjective Assessment - 09/26/18 1033    Subjective  COVID-19 screen performed prior to patient entering clinic.  Patient reports feeling better but still getting there.    Pertinent History  HTN, R TKA 09/02/2018, Rheumatoid Arthritis    Limitations  Sitting;Standing;Walking;House hold activities    Diagnostic tests  x-ray    Patient Stated Goals  get rid of  pain, walk without walker    Currently in Pain?  Yes    Pain Location  Knee    Pain Orientation  Right    Pain Descriptors / Indicators  Aching    Pain Type  Surgical pain    Pain Onset  1 to 4 weeks ago    Pain Frequency  Constant         [facility] PT Assessment - 09/26/18 0001      Assessment   Medical Diagnosis  Unilateral primary osteoarthritis, right knee    Referring Provider (PT)  Lanney Gins, PA-C/ Charlann Boxer, MD                   [facility] Adult PT Treatment/Exercise - 09/26/18 0001      Exercises   Exercises  Knee/Hip      Knee/Hip Exercises: Aerobic   Nustep  Level 4 moving forward as tolerated to increase knee flexion x 15 minutes.      Knee/Hip Exercises: Machines for Strengthening   Cybex Knee Extension  attempted but unable    Cybex Knee Flexion  10# 2x10      Knee/Hip Exercises: Standing   Heel Raises  Both;1 set;10 reps    Hip Abduction  AROM;Both;1 set;10 reps;Knee straight      Knee/Hip Exercises: Seated   Long Arc Quad  AROM;Strengthening;Right;10 reps    Long Arc Quad Weight  2 lbs.    Clamshell with TheraBand  Red   2x10     Modalities   Modalities  Electrical Stimulation;Vasopneumatic      Electrical Stimulation   Electrical Stimulation Location  Left knee.    Electrical Stimulation Action  IFC    Electrical Stimulation Parameters  80-150 hz x10 mins    Electrical Stimulation Goals  Edema;Pain      Vasopneumatic   Number Minutes Vasopneumatic   10 minutes    Vasopnuematic Location   Knee    Vasopneumatic Pressure  Low      Manual Therapy   Manual Therapy  Passive ROM    Passive ROM  Right knee PROM into flexion and extension and patellar mobs               PT Short Term Goals - 09/15/18 1034      PT SHORT TERM GOAL #1   Title  Patient will be independent with HEP    Time  2    Period  Weeks    Status  On-going      PT SHORT TERM GOAL #2   Title  Patient will demonstrate 90+ degrees of right knee flexion AROM to  improve ROM for functional tasks    Time  2    Period  Weeks      PT SHORT TERM GOAL #3   Title  Patient will demonstrate 10 degrees or less of right knee extension AROM to improve gait mechanics.    Time  2    Period  Weeks    Status  On-going        PT Long Term Goals - 09/05/18 1245      PT LONG TERM GOAL #1   Title  Patient will be independent with advanced HEP    Time  4    Period  Weeks    Status  New      PT LONG TERM GOAL #2   Title  Patient will demonstrate 115+ degrees of right knee flexion AROM to improve ability to perform functoinal tasks    Time  4    Period  Weeks    Status  New      PT LONG TERM GOAL #3   Title  Patient will demonstrate 5 degrees or less of right knee extension AROM to improve gait mechanics.    Time  4    Period  Weeks    Status  New      PT LONG TERM GOAL #4   Title  Patient will report ability to ambulate front steps with 1 rail and reciprocating gait pattern to safely enter/exit home.    Time  4    Period  Weeks    Status  New      PT LONG TERM GOAL #5   Title  Patient will demonstrate 4+/5 or greater right knee MMT in all planes to improve stability during functional tasks.    Time  4    Period  Weeks    Status  New            Plan - 09/26/18 1222    Clinical Impression Statement  Patient was able to tolerate progression of treatment well but was unable to perform knee extension machine without discomfort in anterior aspect of  the right knee. Exercise terminated and regressed to 2# LAQ. Patient tolerated better and was able to complete x10 reps but still with ongoing discomfort. Patient guided through standing exercises to address weighbearing tolerance, balance and hip abductor strengthening. Patient still limited with standing tolerance stating "it feels like my knee is swelling up." No adverse affects noted upon removal of modalities.    Personal Factors and Comorbidities  Age;Comorbidity 1    Comorbidities  HTN, R TKA  09/02/2018    Examination-Activity Limitations  Bed Mobility;Dressing;Stand;Stairs;Squat;Sit;Transfers;Locomotion Level    Examination-Participation Restrictions  Cleaning;Driving    Stability/Clinical Decision Making  Stable/Uncomplicated    Clinical Decision Making  Low    Rehab Potential  Good    PT Frequency  2x / week    PT Duration  6 weeks    PT Treatment/Interventions  ADLs/Self Care Home Management;Cryotherapy;Electrical Stimulation;Moist Heat;Gait training;Stair training;Functional mobility training;Therapeutic activities;Therapeutic exercise;Balance training;Passive range of motion;Patient/family education;Neuromuscular re-education;Manual techniques;Taping;Vasopneumatic Device    PT Next Visit Plan  Okay to start strenthening exercises and stationary bike.    Consulted and Agree with Plan of Care  Patient       Patient will benefit from skilled therapeutic intervention in order to improve the following deficits and impairments:  Decreased activity tolerance, Decreased range of motion, Decreased strength, Difficulty walking, Increased edema, Pain  Visit Diagnosis: Stiffness of right knee, not elsewhere classified  Acute pain of right knee  Difficulty in walking, not elsewhere classified  Muscle weakness (generalized)     Problem List Patient Active Problem List   Diagnosis Date Noted  . Obese 09/03/2018  . 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 PAC (premature atrial contraction) 07/11/2017  . History of hypothyroidism 06/11/2017  . Essential hypertension 06/11/2017  . History of obesity 06/11/2017  . S/P right knee arthroscopy 06/11/2017  . DDD (degenerative disc disease), cervical 06/11/2017  . DDD (degenerative disc disease), lumbar 06/11/2017  . History of carpal tunnel surgery of right wrist 06/11/2017  . Primary osteoarthritis of both knees 06/11/2017  . Chest pain, precordial 05/05/2017  . PVC's (premature ventricular contractions) 05/03/2017  . Family history of  premature CAD 05/03/2017  . 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 Uveitis 10/10/2015  . Diverticulitis 10/10/2015  . Dyspnea 12/05/2010  . Palpitations 12/05/2010    Guss Bunde, PT, DPT 09/26/2018, 12:28 PM  [facility] [address] [place], [place], [address] Phone: [phone]   Fax:  [phone]  Name: Kiara Miller MRN: [id number] Date of Birth: [birth year]

## 2018-09-29 ENCOUNTER — Ambulatory Visit: Payer: Medicare Other | Admitting: Physical Therapy

## 2018-09-29 ENCOUNTER — Other Ambulatory Visit: Payer: Self-pay | Admitting: Cardiology

## 2018-09-29 ENCOUNTER — Other Ambulatory Visit: Payer: Self-pay

## 2018-09-29 DIAGNOSIS — M25661 Stiffness of right knee, not elsewhere classified: Secondary | ICD-10-CM

## 2018-09-29 DIAGNOSIS — M25561 Pain in right knee: Secondary | ICD-10-CM | POA: Diagnosis not present

## 2018-09-29 DIAGNOSIS — R262 Difficulty in walking, not elsewhere classified: Secondary | ICD-10-CM

## 2018-09-29 DIAGNOSIS — M6281 Muscle weakness (generalized): Secondary | ICD-10-CM

## 2018-09-29 NOTE — Therapy (Signed)
[facility] [place], [place], [address] Phone: [phone]   Fax:  [phone]  Physical Therapy Treatment  Patient Details  Name: Kiara Miller MRN: [id number] Date of Birth: 14-Nov-1950 Referring Provider (PT): Danae Orleans, PA-C/ Alvan Dame, MD   Encounter Date: 09/29/2018  PT End of Session - 09/29/18 1115    Visit Number  8    Number of Visits  12    Date for PT Re-Evaluation  10/10/18    PT Start Time  1031    PT Stop Time  1123    PT Time Calculation (min)  52 min    Activity Tolerance  Patient tolerated treatment well    Behavior During Therapy  [facility] for tasks assessed/performed       Past Medical History:  Diagnosis Date  . Diverticulitis April 2017  . Hyperlipidemia   . Hypertension   . Joint pain   . Polyarthralgia   . Right knee pain     Past Surgical History:  Procedure Laterality Date  . ABDOMINAL HYSTERECTOMY    . CARPAL TUNNEL RELEASE    . MANDIBLE SURGERY    . NM MYOVIEW LTD  06/2017   (Ordered for coronary calcium score 376) LOW RISK.  EF 72%.  No ischemia or infarction.  No wall motion normality.  No change from previous.  . OSTEOTOMY PELVIS BILATERAL    . TONSILLECTOMY    . TONSILLECTOMY    . 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 TRANSTHORACIC ECHOCARDIOGRAM  12/2010   EF 65-70%. Mild LVH. No RWMA/ Gr1 DD.  . TUBAL LIGATION    . WRIST FUSION      There were no vitals filed for this visit.  Subjective Assessment - 09/29/18 1047    Subjective  COVID-19 screen performed prior to patient entering clinic.  Patient reportedongoing soreness, did good after last treatment    Pertinent History  HTN, R TKA 09/02/2018, Rheumatoid Arthritis    Limitations  Sitting;Standing;Walking;House hold activities    Diagnostic tests  x-ray    Patient Stated Goals  get  rid of pain, walk without walker    Currently in Pain?  Yes    Pain Score  5     Pain Location  Knee    Pain Orientation  Right    Pain Descriptors / Indicators  Aching    Pain Type  Surgical pain    Pain Onset  1 to 4 weeks ago    Aggravating Factors   increased movement    Pain Relieving Factors  rest and meds         [facility] PT Assessment - 09/29/18 0001      AROM   AROM Assessment Site  Knee    Right/Left Knee  Right    Right Knee Extension  -7    Right Knee Flexion  111      PROM   PROM Assessment Site  Knee    Right/Left Knee  Right    Right Knee Extension  -4    Right Knee Flexion  114                   [facility] Adult PT Treatment/Exercise - 09/29/18 0001      Knee/Hip Exercises: Stretches   Knee: Self-Stretch to increase Flexion  3 reps;Right;20 seconds  Knee/Hip Exercises: Aerobic   Nustep  L4 x29mn      Knee/Hip Exercises: Standing   Rocker Board  2 minutes      Knee/Hip Exercises: Seated   Long Arc Quad  AROM;Strengthening;Right;2 sets;10 reps    Long Arc Quad Weight  3 lbs.      EAcupuncturistLocation  Left knee.    EChartered certified accountant IFC    Electrical Stimulation Parameters  80-'150hz'  x140m    Electrical Stimulation Goals  Edema;Pain      Vasopneumatic   Number Minutes Vasopneumatic   10 minutes    Vasopnuematic Location   Knee    Vasopneumatic Pressure  Low      Manual Therapy   Manual Therapy  Passive ROM;Soft tissue mobilization    Soft tissue mobilization  gentle STW to ITB and around insition to decrease soreness    Passive ROM  Right knee PROM into flexion and extension and patellar mobs to improve mobility               PT Short Term Goals - 09/29/18 1121      PT SHORT TERM GOAL #1   Title  Patient will be independent with HEP    Time  2    Period  Weeks    Status  Achieved      PT SHORT TERM GOAL #2   Title  Patient will demonstrate 90+ degrees of right knee  flexion AROM to improve ROM for functional tasks    Time  2    Period  Weeks    Status  Achieved   AROM 111 degrees 09/29/18     PT SHORT TERM GOAL #3   Title  Patient will demonstrate 10 degrees or less of right knee extension AROM to improve gait mechanics.    Time  2    Period  Weeks    Status  Achieved   AROM -7 degrees 09/29/18       PT Long Term Goals - 09/29/18 1121      PT LONG TERM GOAL #1   Title  Patient will be independent with advanced HEP    Time  4    Period  Weeks    Status  On-going      PT LONG TERM GOAL #2   Title  Patient will demonstrate 115+ degrees of right knee flexion AROM to improve ability to perform functoinal tasks    Time  4    Period  Weeks    Status  On-going      PT LONG TERM GOAL #3   Title  Patient will demonstrate 5 degrees or less of right knee extension AROM to improve gait mechanics.    Time  4    Period  Weeks    Status  On-going      PT LONG TERM GOAL #4   Title  Patient will report ability to ambulate front steps with 1 rail and reciprocating gait pattern to safely enter/exit home.    Time  4    Period  Weeks    Status  On-going      PT LONG TERM GOAL #5   Title  Patient will demonstrate 4+/5 or greater right knee MMT in all planes to improve stability during functional tasks.    Time  4    Period  Weeks    Status  On-going  Plan - 09/29/18 1119    Clinical Impression Statement  Patient tolerated treatment well today. Patient progressing with all activities. Patient improving with active and passive ROM in right knee. Patient has limitations with soreness and weakness in knee. Patient is doing HEP as directed. Patient met all STG's today and all LTG's ongoing at this time.    Personal Factors and Comorbidities  Age;Comorbidity 1    Comorbidities  HTN, R TKA 09/02/2018    Examination-Activity Limitations  Bed Mobility;Dressing;Stand;Stairs;Squat;Sit;Transfers;Locomotion Level    Examination-Participation  Restrictions  Cleaning;Driving    Stability/Clinical Decision Making  Stable/Uncomplicated    Rehab Potential  Good    PT Frequency  2x / week    PT Duration  6 weeks    PT Treatment/Interventions  ADLs/Self Care Home Management;Cryotherapy;Electrical Stimulation;Moist Heat;Gait training;Stair training;Functional mobility training;Therapeutic activities;Therapeutic exercise;Balance training;Passive range of motion;Patient/family education;Neuromuscular re-education;Manual techniques;Taping;Vasopneumatic Device    PT Next Visit Plan  cont with gentle PRE's, ROM and modalities PRN    Consulted and Agree with Plan of Care  Patient       Patient will benefit from skilled therapeutic intervention in order to improve the following deficits and impairments:  Decreased activity tolerance, Decreased range of motion, Decreased strength, Difficulty walking, Increased edema, Pain  Visit Diagnosis: Acute pain of right knee  Stiffness of right knee, not elsewhere classified  Difficulty in walking, not elsewhere classified  Muscle weakness (generalized)     Problem List Patient Active Problem List   Diagnosis Date Noted  . Obese 09/03/2018  . 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 PAC (premature atrial contraction) 07/11/2017  . History of hypothyroidism 06/11/2017  . Essential hypertension 06/11/2017  . History of obesity 06/11/2017  . S/P right knee arthroscopy 06/11/2017  . DDD (degenerative disc disease), cervical 06/11/2017  . DDD (degenerative disc disease), lumbar 06/11/2017  . History of carpal tunnel surgery of right wrist 06/11/2017  . Primary osteoarthritis of both knees 06/11/2017  . Chest pain, precordial 05/05/2017  . PVC's (premature ventricular contractions) 05/03/2017  . Family history of premature CAD 05/03/2017  . Rheumatoid arthritis of multiple sites without rheumatoid factor ([place]) 10/10/2015  . Uveitis 10/10/2015  . Diverticulitis 10/10/2015  . Dyspnea 12/05/2010  .  Palpitations 12/05/2010    Phillips Climes, PTA 09/29/2018, 11:28 AM  [facility] [place], [place], [address] Phone: [phone]   Fax:  [phone]  Name: Kiara Miller MRN: [id number] Date of Birth: 09-12-1950

## 2018-10-01 ENCOUNTER — Other Ambulatory Visit: Payer: Self-pay

## 2018-10-01 ENCOUNTER — Ambulatory Visit: Payer: Medicare Other | Admitting: Physical Therapy

## 2018-10-01 ENCOUNTER — Encounter: Payer: Self-pay | Admitting: Physical Therapy

## 2018-10-01 DIAGNOSIS — M25561 Pain in right knee: Secondary | ICD-10-CM | POA: Diagnosis not present

## 2018-10-01 DIAGNOSIS — R262 Difficulty in walking, not elsewhere classified: Secondary | ICD-10-CM

## 2018-10-01 DIAGNOSIS — M25661 Stiffness of right knee, not elsewhere classified: Secondary | ICD-10-CM

## 2018-10-01 DIAGNOSIS — M6281 Muscle weakness (generalized): Secondary | ICD-10-CM

## 2018-10-01 NOTE — Therapy (Addendum)
[facility] [address] [place], [place], [address] Phone: [phone]   Fax:  [phone]  Physical Therapy Treatment  Patient Details  Name: Kiara FinesLisa B Miller MRN: [id number] Date of Birth: 25-Feb-1950 Referring Provider (PT): Lanney GinsMatthew Babish, PA-C/ Charlann Boxerlin, MD   Encounter Date: 10/01/2018  PT End of Session - 10/01/18 1037    Visit Number  9    Number of Visits  18    Date for PT Re-Evaluation  10/31/18    PT Start Time  1026    PT Stop Time  1122    PT Time Calculation (min)  56 min    Equipment Utilized During Treatment  Other (comment)   SPC   Activity Tolerance  Patient tolerated treatment well    Behavior During Therapy  [facility] for [facility] assessed/performed       Past Medical History:  Diagnosis Date  . Diverticulitis April 2017  . Hyperlipidemia   . Hypertension   . Joint pain   . Polyarthralgia   . Right knee pain     Past Surgical History:  Procedure Laterality Date  . ABDOMINAL HYSTERECTOMY    . CARPAL TUNNEL RELEASE    . MANDIBLE SURGERY    . NM MYOVIEW LTD  06/2017   (Ordered for coronary calcium score 376) LOW RISK.  EF 72%.  No ischemia or infarction.  No wall motion normality.  No change from previous.  . OSTEOTOMY PELVIS BILATERAL    . TONSILLECTOMY    . TONSILLECTOMY    . TOTAL KNEE ARTHROPLASTY Right 09/02/2018   Procedure: TOTAL KNEE ARTHROPLASTY;  Surgeon: Durene Romanslin, Matthew, MD;  Location: [facility];  Service: Orthopedics;  Laterality: Right;  70 mins  . TRANSTHORACIC ECHOCARDIOGRAM  12/2010   EF 65-70%. Mild LVH. No RWMA/ Gr1 DD.  . TUBAL LIGATION    . WRIST FUSION      There were no vitals filed for this visit.  Subjective Assessment - 10/01/18 1036    Subjective  COVID 19 screening performed on patient upon arrival. Patient reports continued R hip pain and is uable to wear her orthotics secondary to RLE is now longer due to TKR.    Pertinent History  HTN, R TKA 09/02/2018, Rheumatoid Arthritis    Limitations   Sitting;Standing;Walking;House hold activities    Diagnostic tests  x-ray    Patient Stated Goals  get rid of pain, walk without walker    Currently in Pain?  Yes    Pain Score  5     Pain Location  Knee    Pain Orientation  Right    Pain Descriptors / Indicators  Discomfort    Pain Type  Surgical pain    Pain Onset  1 to 4 weeks ago    Pain Frequency  Constant    Multiple Pain Sites  Yes    Pain Score  5    Pain Location  Hip    Pain Orientation  Right    Pain Descriptors / Indicators  Discomfort    Pain Type  Acute pain    Pain Onset  1 to 4 weeks ago    Pain Frequency  Constant         [facility] PT Assessment - 10/01/18 0001      Assessment   Medical Diagnosis  Unilateral primary osteoarthritis, right knee    Referring Provider (PT)  Lanney GinsMatthew Babish, PA-C/ Charlann Boxerlin, MD    Onset Date/Surgical Date  09/02/18    Next MD Visit  10/17/2018    Prior Therapy  no      Precautions   Precautions  Other (comment)    Precaution Comments  no ultrasound      Restrictions   Weight Bearing Restrictions  No      ROM / Strength   AROM / PROM / Strength  AROM      AROM   Overall AROM   Within functional limits for tasks performed    AROM Assessment Site  Knee    Right/Left Knee  Right    Right Knee Extension  -3    Right Knee Flexion  120                   [facility] Adult PT Treatment/Exercise - 10/01/18 0001      Knee/Hip Exercises: Aerobic   Nustep  L4, seat 5 x12 min      Knee/Hip Exercises: Standing   Forward Lunges  Right;15 reps;2 seconds    Hip Abduction  AROM;Right;20 reps;Knee straight    Forward Step Up  Right;15 reps;Hand Hold: 2;Step Height: 6"    Rocker Board  2 minutes      Knee/Hip Exercises: Seated   Sit to Sand  10 reps;without UE support      Knee/Hip Exercises: Supine   Short Arc Quad Sets  Strengthening;Right;20 reps      Modalities   Modalities  Environmental manager   R knee    Statistician Action  IFC    Electrical Stimulation Parameters  80-150 hz x15 min    Electrical Stimulation Goals  Edema;Pain      Vasopneumatic   Number Minutes Vasopneumatic   15 minutes    Vasopnuematic Location   Knee    Vasopneumatic Pressure  Low    Vasopneumatic Temperature   34               PT Short Term Goals - 09/29/18 1121      PT SHORT TERM GOAL #1   Title  Patient will be independent with HEP    Time  2    Period  Weeks    Status  Achieved      PT SHORT TERM GOAL #2   Title  Patient will demonstrate 90+ degrees of right knee flexion AROM to improve ROM for functional tasks    Time  2    Period  Weeks    Status  Achieved   AROM 111 degrees 09/29/18     PT SHORT TERM GOAL #3   Title  Patient will demonstrate 10 degrees or less of right knee extension AROM to improve gait mechanics.    Time  2    Period  Weeks    Status  Achieved   AROM -7 degrees 09/29/18       PT Long Term Goals - 09/29/18 1121      PT LONG TERM GOAL #1   Title  Patient will be independent with advanced HEP    Time  4    Period  Weeks    Status  On-going      PT LONG TERM GOAL #2   Title  Patient will demonstrate 115+ degrees of right knee flexion AROM to improve ability to perform functoinal tasks    Time  4    Period  Weeks    Status  On-going      PT LONG TERM GOAL #3  Title  Patient will demonstrate 5 degrees or less of right knee extension AROM to improve gait mechanics.    Time  4    Period  Weeks    Status  On-going      PT LONG TERM GOAL #4   Title  Patient will report ability to ambulate front steps with 1 rail and reciprocating gait pattern to safely enter/exit home.    Time  4    Period  Weeks    Status  On-going      PT LONG TERM GOAL #5   Title  Patient will demonstrate 4+/5 or greater right knee MMT in all planes to improve stability during functional tasks.    Time  4    Period  Weeks    Status  On-going            Plan  - 10/01/18 1105    Clinical Impression Statement  Patient presented in clinic with reports of pain from compensatory/adjustment strategies following TKR. Patient able to complete therex well with hip malalignment noted during standing hip abduction. Corrective training completed with stair training to avoid compensation and for functional strengthening. AROM of R knee measured as 3-120 deg. Normal modalities response noted following removal of the modalities.    Personal Factors and Comorbidities  Age;Comorbidity 1    Comorbidities  HTN, R TKA 09/02/2018    Examination-Activity Limitations  Bed Mobility;Dressing;Stand;Stairs;Squat;Sit;Transfers;Locomotion Level    Examination-Participation Restrictions  Cleaning;Driving    Stability/Clinical Decision Making  Stable/Uncomplicated    Rehab Potential  Good    PT Frequency  2x / week    PT Duration  6 weeks    PT Treatment/Interventions  ADLs/Self Care Home Management;Cryotherapy;Electrical Stimulation;Moist Heat;Gait training;Stair training;Functional mobility training;Therapeutic activities;Therapeutic exercise;Balance training;Passive range of motion;Patient/family education;Neuromuscular re-education;Manual techniques;Taping;Vasopneumatic Device    PT Next Visit Plan  cont with gentle PRE's, ROM and modalities PRN    Consulted and Agree with Plan of Care  Patient       Patient will benefit from skilled therapeutic intervention in order to improve the following deficits and impairments:  Decreased activity tolerance, Decreased range of motion, Decreased strength, Difficulty walking, Increased edema, Pain  Visit Diagnosis: Acute pain of right knee  Stiffness of right knee, not elsewhere classified  Difficulty in walking, not elsewhere classified  Muscle weakness (generalized)     Problem List Patient Active Problem List   Diagnosis Date Noted  . Obese 09/03/2018  . 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 PAC (premature atrial contraction)  07/11/2017  . History of hypothyroidism 06/11/2017  . Essential hypertension 06/11/2017  . History of obesity 06/11/2017  . S/P right knee arthroscopy 06/11/2017  . DDD (degenerative disc disease), cervical 06/11/2017  . DDD (degenerative disc disease), lumbar 06/11/2017  . History of carpal tunnel surgery of right wrist 06/11/2017  . Primary osteoarthritis of both knees 06/11/2017  . Chest pain, precordial 05/05/2017  . PVC's (premature ventricular contractions) 05/03/2017  . Family history of premature CAD 05/03/2017  . Rheumatoid arthritis of multiple sites without rheumatoid factor ([place]) 10/10/2015  . Uveitis 10/10/2015  . Diverticulitis 10/10/2015  . Dyspnea 12/05/2010  . Palpitations 12/05/2010    Standley Brooking, PTA 10/01/2018, 1:29 PM  [facility] [address] [place], [place], [address] Phone: [phone]   Fax:  [phone]  Name: Kiara Miller MRN: [id number] Date of Birth: 01-25-1951

## 2018-10-03 ENCOUNTER — Other Ambulatory Visit: Payer: Self-pay

## 2018-10-03 ENCOUNTER — Encounter: Payer: Self-pay | Admitting: Physical Therapy

## 2018-10-03 ENCOUNTER — Ambulatory Visit: Payer: Medicare Other | Admitting: Physical Therapy

## 2018-10-03 DIAGNOSIS — R262 Difficulty in walking, not elsewhere classified: Secondary | ICD-10-CM

## 2018-10-03 DIAGNOSIS — M25561 Pain in right knee: Secondary | ICD-10-CM | POA: Diagnosis not present

## 2018-10-03 DIAGNOSIS — M25661 Stiffness of right knee, not elsewhere classified: Secondary | ICD-10-CM

## 2018-10-03 DIAGNOSIS — M6281 Muscle weakness (generalized): Secondary | ICD-10-CM

## 2018-10-03 NOTE — Therapy (Signed)
[facility] [place], [place], [address] Phone: [phone]   Fax:  [phone]  Physical Therapy Treatment  Patient Details  Name: Kiara Miller MRN: [id number] Date of Birth: 09/08/1950 Referring Provider (PT): Danae Orleans, PA-C/ Alvan Dame, MD   Encounter Date: 10/03/2018  PT End of Session - 10/03/18 1128    Visit Number  10    Number of Visits  18    Date for PT Re-Evaluation  10/31/18    PT Start Time  1030    PT Stop Time  1125    PT Time Calculation (min)  55 min    Activity Tolerance  Patient tolerated treatment well    Behavior During Therapy  [facility] for tasks assessed/performed       Past Medical History:  Diagnosis Date  . Diverticulitis April 2017  . Hyperlipidemia   . Hypertension   . Joint pain   . Polyarthralgia   . Right knee pain     Past Surgical History:  Procedure Laterality Date  . ABDOMINAL HYSTERECTOMY    . CARPAL TUNNEL RELEASE    . MANDIBLE SURGERY    . NM MYOVIEW LTD  06/2017   (Ordered for coronary calcium score 376) LOW RISK.  EF 72%.  No ischemia or infarction.  No wall motion normality.  No change from previous.  . OSTEOTOMY PELVIS BILATERAL    . TONSILLECTOMY    . TONSILLECTOMY    . 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 TRANSTHORACIC ECHOCARDIOGRAM  12/2010   EF 65-70%. Mild LVH. No RWMA/ Gr1 DD.  . TUBAL LIGATION    . WRIST FUSION      There were no vitals filed for this visit.  Subjective Assessment - 10/03/18 1110    Subjective  COVID 19 screening performed on patient upon arrival. Patient reports continued R hip pain and low back pain with radiaiton down her R LE due to Leg Length difference. Pt  Pt encouraged to follow up with her orthopedist about new R foot orthotic.    Pertinent History  HTN, R TKA 09/02/2018, Rheumatoid Arthritis     Limitations  Sitting;Standing;Walking;House hold activities    Patient Stated Goals  get rid of pain, walk without walker    Currently in Pain?  Yes    Pain Score  4     Pain Orientation  Right    Pain Descriptors / Indicators  Sore;Tightness    Pain Type  Surgical pain    Pain Onset  More than a month ago    Multiple Pain Sites  Yes    Pain Score  6    Pain Location  Hip    Pain Orientation  Right    Pain Descriptors / Indicators  Aching    Pain Type  Acute pain    Pain Onset  1 to 4 weeks ago                                 PT Short Term Goals - 10/03/18 1138      PT SHORT TERM GOAL #1   Title  Patient will be independent with HEP    Time  2    Period  Weeks    Status  Achieved  PT SHORT TERM GOAL #2   Title  Patient will demonstrate 90+ degrees of right knee flexion AROM to improve ROM for functional tasks    Time  2    Period  Weeks    Status  Achieved      PT SHORT TERM GOAL #3   Title  Patient will demonstrate 10 degrees or less of right knee extension AROM to improve gait mechanics.    Time  2    Period  Weeks    Status  Achieved        PT Long Term Goals - 10/03/18 1138      PT LONG TERM GOAL #1   Time  4    Period  Weeks    Status  On-going      PT LONG TERM GOAL #2   Title  Patient will demonstrate 115+ degrees of right knee flexion AROM to improve ability to perform functoinal tasks    Time  4    Period  Weeks    Status  On-going      PT LONG TERM GOAL #3   Title  Patient will demonstrate 5 degrees or less of right knee extension AROM to improve gait mechanics.    Period  Weeks    Status  On-going      PT LONG TERM GOAL #4   Title  Patient will report ability to ambulate front steps with 1 rail and reciprocating gait pattern to safely enter/exit home.    Period  Weeks    Status  On-going      PT LONG TERM GOAL #5   Title  Patient will demonstrate 4+/5 or greater right knee MMT in all planes to improve  stability during functional tasks.    Period  Weeks    Status  On-going            Plan - 10/03/18 1129    Clinical Impression Statement  Pt presenting today reporting pain in her R knee and R hip/back. Pt's pain in R hip /back is due to her leg length difference and she is unable to wear her current orthotics. Pt was instructed to follow up with her MD about new orthotic referral for her R foot at [facility]. Pt agreeing. Pt has progressed with her AROM of -3 to 112 degrees. Pt still progressing with strength. Continue with skilled PT.    Personal Factors and Comorbidities  Age;Comorbidity 1    Comorbidities  HTN, R TKA 09/02/2018    Examination-Activity Limitations  Bed Mobility;Dressing;Stand;Stairs;Squat;Sit;Transfers;Locomotion Level    Stability/Clinical Decision Making  Stable/Uncomplicated    Rehab Potential  Good    PT Frequency  2x / week    PT Treatment/Interventions  ADLs/Self Care Home Management;Cryotherapy;Electrical Stimulation;Moist Heat;Gait training;Stair training;Functional mobility training;Therapeutic activities;Therapeutic exercise;Balance training;Passive range of motion;Patient/family education;Neuromuscular re-education;Manual techniques;Taping;Vasopneumatic Device    PT Next Visit Plan  cont with gentle PRE's, ROM and modalities PRN    Consulted and Agree with Plan of Care  Patient       Patient will benefit from skilled therapeutic intervention in order to improve the following deficits and impairments:  Decreased activity tolerance, Decreased range of motion, Decreased strength, Difficulty walking, Increased edema, Pain  Visit Diagnosis: Acute pain of right knee  Stiffness of right knee, not elsewhere classified  Difficulty in walking, not elsewhere classified  Muscle weakness (generalized)     Problem List Patient Active Problem List   Diagnosis Date Noted  . Obese 09/03/2018  .  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 PAC (premature atrial  contraction) 07/11/2017  . History of hypothyroidism 06/11/2017  . Essential hypertension 06/11/2017  . History of obesity 06/11/2017  . S/P right knee arthroscopy 06/11/2017  . DDD (degenerative disc disease), cervical 06/11/2017  . DDD (degenerative disc disease), lumbar 06/11/2017  . History of carpal tunnel surgery of right wrist 06/11/2017  . Primary osteoarthritis of both knees 06/11/2017  . Chest pain, precordial 05/05/2017  . PVC's (premature ventricular contractions) 05/03/2017  . Family history of premature CAD 05/03/2017  . 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 Uveitis 10/10/2015  . Diverticulitis 10/10/2015  . Dyspnea 12/05/2010  . Palpitations 12/05/2010    Sharmon Leyden, PT 10/03/2018, 11:44 AM  [facility] [address] [place], [place], [address] Phone: [phone]   Fax:  [phone]  Name: Kiara Miller MRN: [id number] Date of Birth: 1950-11-13

## 2018-10-06 ENCOUNTER — Other Ambulatory Visit: Payer: Self-pay

## 2018-10-06 ENCOUNTER — Ambulatory Visit: Payer: Medicare Other | Admitting: Physical Therapy

## 2018-10-06 ENCOUNTER — Encounter: Payer: Self-pay | Admitting: Physical Therapy

## 2018-10-06 DIAGNOSIS — M25561 Pain in right knee: Secondary | ICD-10-CM | POA: Diagnosis not present

## 2018-10-06 DIAGNOSIS — M25661 Stiffness of right knee, not elsewhere classified: Secondary | ICD-10-CM

## 2018-10-06 DIAGNOSIS — M6281 Muscle weakness (generalized): Secondary | ICD-10-CM

## 2018-10-06 DIAGNOSIS — R262 Difficulty in walking, not elsewhere classified: Secondary | ICD-10-CM

## 2018-10-06 NOTE — Therapy (Signed)
[facility] [address] [place], [place], [address] Phone: [phone]   Fax:  [phone]  Physical Therapy Treatment  Patient Details  Name: Kiara Miller MRN: [id number] Date of Birth: 1951/02/01 Referring Provider (PT): Lanney GinsMatthew Babish, PA-C/ Charlann Boxerlin, MD   Encounter Date: 10/06/2018  PT End of Session - 10/06/18 1041    Visit Number  11    Number of Visits  18    Date for PT Re-Evaluation  10/31/18    PT Start Time  1032    PT Stop Time  1126    PT Time Calculation (min)  54 min    Activity Tolerance  Patient tolerated treatment well    Behavior During Therapy  [facility] for tasks assessed/performed       Past Medical History:  Diagnosis Date  . Diverticulitis April 2017  . Hyperlipidemia   . Hypertension   . Joint pain   . Polyarthralgia   . Right knee pain     Past Surgical History:  Procedure Laterality Date  . ABDOMINAL HYSTERECTOMY    . CARPAL TUNNEL RELEASE    . MANDIBLE SURGERY    . NM MYOVIEW LTD  06/2017   (Ordered for coronary calcium score 376) LOW RISK.  EF 72%.  No ischemia or infarction.  No wall motion normality.  No change from previous.  . OSTEOTOMY PELVIS BILATERAL    . TONSILLECTOMY    . TONSILLECTOMY    . TOTAL KNEE ARTHROPLASTY Right 09/02/2018   Procedure: TOTAL KNEE ARTHROPLASTY;  Surgeon: Durene Romanslin, Matthew, MD;  Location: [facility];  Service: Orthopedics;  Laterality: Right;  70 mins  . TRANSTHORACIC ECHOCARDIOGRAM  12/2010   EF 65-70%. Mild LVH. No RWMA/ Gr1 DD.  . TUBAL LIGATION    . WRIST FUSION      There were no vitals filed for this visit.  Subjective Assessment - 10/06/18 1030    Subjective  COVID 19 screening performed on patient upon arrival. Reports more R knee pain today and continued R hip pain. Reports walking more yesterday around stores for the first time in 6 weeks.    Pertinent History  HTN, R TKA 09/02/2018, Rheumatoid Arthritis    Limitations  Sitting;Standing;Walking;House hold  activities    Diagnostic tests  x-ray    Patient Stated Goals  get rid of pain, walk without walker    Currently in Pain?  Yes    Pain Score  4     Pain Location  Knee    Pain Orientation  Right    Pain Descriptors / Indicators  Discomfort    Pain Type  Surgical pain    Pain Onset  More than a month ago    Pain Frequency  Constant         [facility] PT Assessment - 10/06/18 0001      Assessment   Medical Diagnosis  Unilateral primary osteoarthritis, right knee    Referring Provider (PT)  Lanney GinsMatthew Babish, PA-C/ Charlann Boxerlin, MD    Onset Date/Surgical Date  09/02/18    Next MD Visit  10/17/2018    Prior Therapy  no      Precautions   Precautions  Other (comment)    Precaution Comments  no ultrasound      Restrictions   Weight Bearing Restrictions  No                   [facility] Adult PT Treatment/Exercise - 10/06/18 0001      Knee/Hip Exercises:  Aerobic   Recumbent Bike  L1, seat 4 x13 min (partial to full revolutions)      Knee/Hip Exercises: Standing   Forward Lunges  Right;15 reps;2 seconds    Rocker Board  3 minutes      Knee/Hip Exercises: Seated   Long Arc Quad  AROM;Right;2 sets;10 reps      Modalities   Modalities  Management consultant  IFC    Electrical Stimulation Parameters  80-150 hz x15 min    Electrical Stimulation Goals  Edema;Pain      Vasopneumatic   Number Minutes Vasopneumatic   15 minutes    Vasopnuematic Location   Knee    Vasopneumatic Pressure  Low    Vasopneumatic Temperature   34      Manual Therapy   Manual Therapy  Passive ROM    Passive ROM  PROM of R knee into flexion, extension                PT Short Term Goals - 10/03/18 1138      PT SHORT TERM GOAL #1   Title  Patient will be independent with HEP    Time  2    Period  Weeks    Status  Achieved      PT SHORT TERM GOAL #2   Title  Patient will  demonstrate 90+ degrees of right knee flexion AROM to improve ROM for functional tasks    Time  2    Period  Weeks    Status  Achieved      PT SHORT TERM GOAL #3   Title  Patient will demonstrate 10 degrees or less of right knee extension AROM to improve gait mechanics.    Time  2    Period  Weeks    Status  Achieved        PT Long Term Goals - 10/03/18 1138      PT LONG TERM GOAL #1   Time  4    Period  Weeks    Status  On-going      PT LONG TERM GOAL #2   Title  Patient will demonstrate 115+ degrees of right knee flexion AROM to improve ability to perform functoinal tasks    Time  4    Period  Weeks    Status  On-going      PT LONG TERM GOAL #3   Title  Patient will demonstrate 5 degrees or less of right knee extension AROM to improve gait mechanics.    Period  Weeks    Status  On-going      PT LONG TERM GOAL #4   Title  Patient will report ability to ambulate front steps with 1 rail and reciprocating gait pattern to safely enter/exit home.    Period  Weeks    Status  On-going      PT LONG TERM GOAL #5   Title  Patient will demonstrate 4+/5 or greater right knee MMT in all planes to improve stability during functional tasks.    Period  Weeks    Status  On-going            Plan - 10/06/18 1113    Clinical Impression Statement  Patient still limited with R knee/hrthotic ip pain today. Patient now has L orhotic only to even LLDs. Patient able to tolerate therex well although  reporting discomfort at end range flexion. Normal modalities response noted following removal of the modalities.    Personal Factors and Comorbidities  Age;Comorbidity 1    Comorbidities  HTN, R TKA 09/02/2018    Examination-Activity Limitations  Bed Mobility;Dressing;Stand;Stairs;Squat;Sit;Transfers;Locomotion Level    Examination-Participation Restrictions  Cleaning;Driving    Stability/Clinical Decision Making  Stable/Uncomplicated    Rehab Potential  Good    PT Frequency  2x / week     PT Duration  6 weeks    PT Treatment/Interventions  ADLs/Self Care Home Management;Cryotherapy;Electrical Stimulation;Moist Heat;Gait training;Stair training;Functional mobility training;Therapeutic activities;Therapeutic exercise;Balance training;Passive range of motion;Patient/family education;Neuromuscular re-education;Manual techniques;Taping;Vasopneumatic Device    PT Next Visit Plan  cont with gentle PRE's, ROM and modalities PRN    Consulted and Agree with Plan of Care  Patient       Patient will benefit from skilled therapeutic intervention in order to improve the following deficits and impairments:  Decreased activity tolerance, Decreased range of motion, Decreased strength, Difficulty walking, Increased edema, Pain  Visit Diagnosis: Acute pain of right knee  Stiffness of right knee, not elsewhere classified  Difficulty in walking, not elsewhere classified  Muscle weakness (generalized)     Problem List Patient Active Problem List   Diagnosis Date Noted  . Obese 09/03/2018  . 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 PAC (premature atrial contraction) 07/11/2017  . History of hypothyroidism 06/11/2017  . Essential hypertension 06/11/2017  . History of obesity 06/11/2017  . S/P right knee arthroscopy 06/11/2017  . DDD (degenerative disc disease), cervical 06/11/2017  . DDD (degenerative disc disease), lumbar 06/11/2017  . History of carpal tunnel surgery of right wrist 06/11/2017  . Primary osteoarthritis of both knees 06/11/2017  . Chest pain, precordial 05/05/2017  . PVC's (premature ventricular contractions) 05/03/2017  . Family history of premature CAD 05/03/2017  . 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 Uveitis 10/10/2015  . Diverticulitis 10/10/2015  . Dyspnea 12/05/2010  . Palpitations 12/05/2010    Marvell Fuller, PTA 10/06/2018, 12:08 PM  [facility] [address] [place], [place], [address] Phone: [phone]   Fax:  [phone]  Name: Kiara Miller MRN: [id number] Date of Birth: May 12, 1950

## 2018-10-08 ENCOUNTER — Other Ambulatory Visit: Payer: Self-pay

## 2018-10-08 ENCOUNTER — Ambulatory Visit: Payer: Medicare Other | Attending: Orthopedic Surgery | Admitting: Physical Therapy

## 2018-10-08 ENCOUNTER — Encounter: Payer: Self-pay | Admitting: Physical Therapy

## 2018-10-08 DIAGNOSIS — R262 Difficulty in walking, not elsewhere classified: Secondary | ICD-10-CM | POA: Diagnosis present

## 2018-10-08 DIAGNOSIS — M25561 Pain in right knee: Secondary | ICD-10-CM | POA: Diagnosis present

## 2018-10-08 DIAGNOSIS — M25661 Stiffness of right knee, not elsewhere classified: Secondary | ICD-10-CM

## 2018-10-08 DIAGNOSIS — M6281 Muscle weakness (generalized): Secondary | ICD-10-CM | POA: Insufficient documentation

## 2018-10-08 NOTE — Therapy (Signed)
[facility] [place], [place], [address] Phone: [phone]   Fax:  [phone]  Physical Therapy Treatment  Patient Details  Name: Kiara Miller MRN: [id number] Date of Birth: [birth year] Referring Provider (PT): Danae Orleans, PA-C/ Alvan Dame, MD   Encounter Date: 10/08/2018  PT End of Session - 10/08/18 1243    Visit Number  12    Number of Visits  18    Date for PT Re-Evaluation  10/31/18    PT Start Time  1032    PT Stop Time  1130    PT Time Calculation (min)  58 min    Activity Tolerance  Patient tolerated treatment well    Behavior During Therapy  [facility] for tasks assessed/performed       Past Medical History:  Diagnosis Date  . Diverticulitis April 2017  . Hyperlipidemia   . Hypertension   . Joint pain   . Polyarthralgia   . Right knee pain     Past Surgical History:  Procedure Laterality Date  . ABDOMINAL HYSTERECTOMY    . CARPAL TUNNEL RELEASE    . MANDIBLE SURGERY    . NM MYOVIEW LTD  06/2017   (Ordered for coronary calcium score 376) LOW RISK.  EF 72%.  No ischemia or infarction.  No wall motion normality.  No change from previous.  . OSTEOTOMY PELVIS BILATERAL    . TONSILLECTOMY    . TONSILLECTOMY    . 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 TRANSTHORACIC ECHOCARDIOGRAM  12/2010   EF 65-70%. Mild LVH. No RWMA/ Gr1 DD.  . TUBAL LIGATION    . WRIST FUSION      There were no vitals filed for this visit.  Subjective Assessment - 10/08/18 1044    Subjective  COVID-19 screen performed prior to patient entering clinic.  Doing okay.    Pertinent History  HTN, R TKA 09/02/2018, Rheumatoid Arthritis    Limitations  Sitting;Standing;Walking;House hold activities    Diagnostic tests  x-ray    Patient Stated Goals  get rid of pain, walk without walker    Currently in  Pain?  Yes    Pain Score  4     Pain Location  Knee    Pain Orientation  Right    Pain Descriptors / Indicators  Discomfort    Pain Type  Surgical pain    Pain Onset  More than a month ago                       [facility] Adult PT Treatment/Exercise - 10/08/18 0001      Exercises   Exercises  Knee/Hip      Knee/Hip Exercises: Aerobic   Recumbent Bike  Level 1 15 minutes.      Knee/Hip Exercises: Machines for Strengthening   Cybex Knee Extension  10# x 3 minutes.    Cybex Knee Flexion  30# x 3 minutes.    Cybex Leg Press  2 plates x 3 minutes.      Modalities   Modalities  Health visitor Stimulation Location  Right knee.    Electrical Stimulation Action  IFC    Electrical Stimulation Parameters  80-150 Hz x 20 minutes.    Electrical Stimulation Goals  Edema;Pain  Vasopneumatic   Number Minutes Vasopneumatic   20 minutes    Vasopnuematic Location   --   Right knee.   Vasopneumatic Pressure  Low               PT Short Term Goals - 10/03/18 1138      PT SHORT TERM GOAL #1   Title  Patient will be independent with HEP    Time  2    Period  Weeks    Status  Achieved      PT SHORT TERM GOAL #2   Title  Patient will demonstrate 90+ degrees of right knee flexion AROM to improve ROM for functional tasks    Time  2    Period  Weeks    Status  Achieved      PT SHORT TERM GOAL #3   Title  Patient will demonstrate 10 degrees or less of right knee extension AROM to improve gait mechanics.    Time  2    Period  Weeks    Status  Achieved        PT Long Term Goals - 10/03/18 1138      PT LONG TERM GOAL #1   Time  4    Period  Weeks    Status  On-going      PT LONG TERM GOAL #2   Title  Patient will demonstrate 115+ degrees of right knee flexion AROM to improve ability to perform functoinal tasks    Time  4    Period  Weeks    Status  On-going      PT LONG TERM GOAL #3    Title  Patient will demonstrate 5 degrees or less of right knee extension AROM to improve gait mechanics.    Period  Weeks    Status  On-going      PT LONG TERM GOAL #4   Title  Patient will report ability to ambulate front steps with 1 rail and reciprocating gait pattern to safely enter/exit home.    Period  Weeks    Status  On-going      PT LONG TERM GOAL #5   Title  Patient will demonstrate 4+/5 or greater right knee MMT in all planes to improve stability during functional tasks.    Period  Weeks    Status  On-going            Plan - 10/08/18 1246    Clinical Impression Statement  Patient did great today with the addition of the leg press.  No complaints.  Excellent job on stationary bike.    Personal Factors and Comorbidities  Age;Comorbidity 1    Comorbidities  HTN, R TKA 09/02/2018    Examination-Activity Limitations  Bed Mobility;Dressing;Stand;Stairs;Squat;Sit;Transfers;Locomotion Level    Stability/Clinical Decision Making  Stable/Uncomplicated    Clinical Decision Making  Low    Rehab Potential  Good    PT Frequency  2x / week    PT Duration  6 weeks    PT Treatment/Interventions  ADLs/Self Care Home Management;Cryotherapy;Electrical Stimulation;Moist Heat;Gait training;Stair training;Functional mobility training;Therapeutic activities;Therapeutic exercise;Balance training;Passive range of motion;Patient/family education;Neuromuscular re-education;Manual techniques;Taping;Vasopneumatic Device    PT Next Visit Plan  cont with gentle PRE's, ROM and modalities PRN    Consulted and Agree with Plan of Care  Patient       Patient will benefit from skilled therapeutic intervention in order to improve the following deficits and impairments:  Decreased activity tolerance, Decreased range of motion, Decreased  strength, Difficulty walking, Increased edema, Pain  Visit Diagnosis: Acute pain of right knee  Stiffness of right knee, not elsewhere classified  Difficulty in  walking, not elsewhere classified  Muscle weakness (generalized)     Problem List Patient Active Problem List   Diagnosis Date Noted  . Obese 09/03/2018  . 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 PAC (premature atrial contraction) 07/11/2017  . History of hypothyroidism 06/11/2017  . Essential hypertension 06/11/2017  . History of obesity 06/11/2017  . S/P right knee arthroscopy 06/11/2017  . DDD (degenerative disc disease), cervical 06/11/2017  . DDD (degenerative disc disease), lumbar 06/11/2017  . History of carpal tunnel surgery of right wrist 06/11/2017  . Primary osteoarthritis of both knees 06/11/2017  . Chest pain, precordial 05/05/2017  . PVC's (premature ventricular contractions) 05/03/2017  . Family history of premature CAD 05/03/2017  . 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 Uveitis 10/10/2015  . Diverticulitis 10/10/2015  . Dyspnea 12/05/2010  . Palpitations 12/05/2010    Kiara Miller, Kiara Miller 10/08/2018, 1:32 PM  [facility] [address] [place], [place], [address] Phone: [phone]   Fax:  [phone]  Name: Kiara Miller MRN: [id number] Date of Birth: Jan 18, 1951

## 2018-10-10 ENCOUNTER — Other Ambulatory Visit: Payer: Self-pay

## 2018-10-10 ENCOUNTER — Ambulatory Visit: Payer: Medicare Other | Admitting: Physical Therapy

## 2018-10-10 ENCOUNTER — Encounter: Payer: Self-pay | Admitting: Physical Therapy

## 2018-10-10 DIAGNOSIS — M25561 Pain in right knee: Secondary | ICD-10-CM | POA: Diagnosis not present

## 2018-10-10 DIAGNOSIS — M6281 Muscle weakness (generalized): Secondary | ICD-10-CM

## 2018-10-10 DIAGNOSIS — M25661 Stiffness of right knee, not elsewhere classified: Secondary | ICD-10-CM

## 2018-10-10 DIAGNOSIS — R262 Difficulty in walking, not elsewhere classified: Secondary | ICD-10-CM

## 2018-10-10 NOTE — Therapy (Signed)
[facility] [place], [place], [address] Phone: [phone]   Fax:  [phone]  Physical Therapy Treatment  Patient Details  Name: Kiara Miller MRN: [id number] Date of Birth: 08/18/1950 Referring Provider (PT): Danae Orleans, PA-C/ Alvan Dame, MD   Encounter Date: 10/10/2018  PT End of Session - 10/10/18 1225    Visit Number  13    Number of Visits  18    Date for PT Re-Evaluation  10/31/18    PT Start Time  1030    PT Stop Time  1121    PT Time Calculation (min)  51 min    Activity Tolerance  Patient tolerated treatment well    Behavior During Therapy  [facility] for tasks assessed/performed       Past Medical History:  Diagnosis Date  . Diverticulitis April 2017  . Hyperlipidemia   . Hypertension   . Joint pain   . Polyarthralgia   . Right knee pain     Past Surgical History:  Procedure Laterality Date  . ABDOMINAL HYSTERECTOMY    . CARPAL TUNNEL RELEASE    . MANDIBLE SURGERY    . NM MYOVIEW LTD  06/2017   (Ordered for coronary calcium score 376) LOW RISK.  EF 72%.  No ischemia or infarction.  No wall motion normality.  No change from previous.  . OSTEOTOMY PELVIS BILATERAL    . TONSILLECTOMY    . TONSILLECTOMY    . 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 TRANSTHORACIC ECHOCARDIOGRAM  12/2010   EF 65-70%. Mild LVH. No RWMA/ Gr1 DD.  . TUBAL LIGATION    . WRIST FUSION      There were no vitals filed for this visit.  Subjective Assessment - 10/10/18 1203    Subjective  COVID-19 screen performed prior to patient entering clinic.  I'm okay.    Pertinent History  HTN, R TKA 09/02/2018, Rheumatoid Arthritis    Limitations  Sitting;Standing;Walking;House hold activities    Diagnostic tests  x-ray    Patient Stated Goals  get rid of pain, walk without walker    Currently in  Pain?  Yes    Pain Score  4     Pain Location  Knee    Pain Orientation  Right    Pain Descriptors / Indicators  Discomfort    Pain Type  Surgical pain    Pain Onset  More than a month ago         [facility] PT Assessment - 10/10/18 0001      PROM   Right Knee Flexion  120                   [facility] Adult PT Treatment/Exercise - 10/10/18 0001      Exercises   Exercises  Knee/Hip      Knee/Hip Exercises: Aerobic   Recumbent Bike  15 minutes.      Knee/Hip Exercises: Machines for Strengthening   Cybex Knee Extension  10# x 3 minutes.    Cybex Knee Flexion  30# x 3 minutes.    Cybex Leg Press  2 plates x 3 minutes.      Acupuncturist Location  --   Right knee.   Electrical Stimulation Action  IFC    Electrical Stimulation Parameters  80-150 Hz x 15 minutes.  Electrical Stimulation Goals  Edema;Pain      Vasopneumatic   Number Minutes Vasopneumatic   15 minutes    Vasopnuematic Location   --   Right knee.   Vasopneumatic Pressure  Low               PT Short Term Goals - 10/03/18 1138      PT SHORT TERM GOAL #1   Title  Patient will be independent with HEP    Time  2    Period  Weeks    Status  Achieved      PT SHORT TERM GOAL #2   Title  Patient will demonstrate 90+ degrees of right knee flexion AROM to improve ROM for functional tasks    Time  2    Period  Weeks    Status  Achieved      PT SHORT TERM GOAL #3   Title  Patient will demonstrate 10 degrees or less of right knee extension AROM to improve gait mechanics.    Time  2    Period  Weeks    Status  Achieved        PT Long Term Goals - 10/03/18 1138      PT LONG TERM GOAL #1   Time  4    Period  Weeks    Status  On-going      PT LONG TERM GOAL #2   Title  Patient will demonstrate 115+ degrees of right knee flexion AROM to improve ability to perform functoinal tasks    Time  4    Period  Weeks    Status  On-going      PT LONG TERM GOAL #3    Title  Patient will demonstrate 5 degrees or less of right knee extension AROM to improve gait mechanics.    Period  Weeks    Status  On-going      PT LONG TERM GOAL #4   Title  Patient will report ability to ambulate front steps with 1 rail and reciprocating gait pattern to safely enter/exit home.    Period  Weeks    Status  On-going      PT LONG TERM GOAL #5   Title  Patient will demonstrate 4+/5 or greater right knee MMT in all planes to improve stability during functional tasks.    Period  Weeks    Status  On-going            Plan - 10/10/18 1229    Clinical Impression Statement  Excellent job today with right knee flexion passively to 120 degrees.    Personal Factors and Comorbidities  Age;Comorbidity 1    Comorbidities  HTN, R TKA 09/02/2018    Examination-Activity Limitations  Bed Mobility;Dressing;Stand;Stairs;Squat;Sit;Transfers;Locomotion Level    Examination-Participation Restrictions  Cleaning;Driving    Stability/Clinical Decision Making  Stable/Uncomplicated    Rehab Potential  Good    PT Frequency  2x / week    PT Duration  6 weeks    PT Treatment/Interventions  ADLs/Self Care Home Management;Cryotherapy;Electrical Stimulation;Moist Heat;Gait training;Stair training;Functional mobility training;Therapeutic activities;Therapeutic exercise;Balance training;Passive range of motion;Patient/family education;Neuromuscular re-education;Manual techniques;Taping;Vasopneumatic Device    PT Next Visit Plan  cont with gentle PRE's, ROM and modalities PRN    Consulted and Agree with Plan of Care  Patient       Patient will benefit from skilled therapeutic intervention in order to improve the following deficits and impairments:  Decreased activity tolerance, Decreased range of motion, Decreased  strength, Difficulty walking, Increased edema, Pain  Visit Diagnosis: Acute pain of right knee  Stiffness of right knee, not elsewhere classified  Difficulty in walking, not  elsewhere classified  Muscle weakness (generalized)     Problem List Patient Active Problem List   Diagnosis Date Noted  . Obese 09/03/2018  . 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 PAC (premature atrial contraction) 07/11/2017  . History of hypothyroidism 06/11/2017  . Essential hypertension 06/11/2017  . History of obesity 06/11/2017  . S/P right knee arthroscopy 06/11/2017  . DDD (degenerative disc disease), cervical 06/11/2017  . DDD (degenerative disc disease), lumbar 06/11/2017  . History of carpal tunnel surgery of right wrist 06/11/2017  . Primary osteoarthritis of both knees 06/11/2017  . Chest pain, precordial 05/05/2017  . PVC's (premature ventricular contractions) 05/03/2017  . Family history of premature CAD 05/03/2017  . 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 Uveitis 10/10/2015  . Diverticulitis 10/10/2015  . Dyspnea 12/05/2010  . Palpitations 12/05/2010    Alanda Colton, [place] MPT 10/10/2018, 12:30 PM  [facility] [address] [place], [place], [address] Phone: [phone]   Fax:  [phone]  Name: Kiara Miller MRN: [id number] Date of Birth: 07-May-1950

## 2018-10-14 ENCOUNTER — Ambulatory Visit: Payer: Medicare Other | Admitting: *Deleted

## 2018-10-14 ENCOUNTER — Other Ambulatory Visit: Payer: Self-pay

## 2018-10-14 DIAGNOSIS — M25561 Pain in right knee: Secondary | ICD-10-CM | POA: Diagnosis not present

## 2018-10-14 DIAGNOSIS — M6281 Muscle weakness (generalized): Secondary | ICD-10-CM

## 2018-10-14 DIAGNOSIS — M25661 Stiffness of right knee, not elsewhere classified: Secondary | ICD-10-CM

## 2018-10-14 DIAGNOSIS — R262 Difficulty in walking, not elsewhere classified: Secondary | ICD-10-CM

## 2018-10-14 NOTE — Therapy (Signed)
[facility] [address] [place], [place], [address] Phone: [phone]   Fax:  [phone]  Physical Therapy Treatment  Patient Details  Name: Kiara Miller MRN: [id number] Date of Birth: 1950-04-03 Referring Provider (PT): Lanney Gins, PA-C/ Charlann Boxer, MD   Encounter Date: 10/14/2018  PT End of Session - 10/14/18 1021    Visit Number  14    Number of Visits  18    Date for PT Re-Evaluation  10/31/18    PT Start Time  0948    PT Stop Time  1030    PT Time Calculation (min)  42 min       Past Medical History:  Diagnosis Date  . Diverticulitis April 2017  . Hyperlipidemia   . Hypertension   . Joint pain   . Polyarthralgia   . Right knee pain     Past Surgical History:  Procedure Laterality Date  . ABDOMINAL HYSTERECTOMY    . CARPAL TUNNEL RELEASE    . MANDIBLE SURGERY    . NM MYOVIEW LTD  06/2017   (Ordered for coronary calcium score 376) LOW RISK.  EF 72%.  No ischemia or infarction.  No wall motion normality.  No change from previous.  . OSTEOTOMY PELVIS BILATERAL    . TONSILLECTOMY    . TONSILLECTOMY    . TOTAL KNEE ARTHROPLASTY Right 09/02/2018   Procedure: TOTAL KNEE ARTHROPLASTY;  Surgeon: Durene Romans, MD;  Location: [facility];  Service: Orthopedics;  Laterality: Right;  70 mins  . TRANSTHORACIC ECHOCARDIOGRAM  12/2010   EF 65-70%. Mild LVH. No RWMA/ Gr1 DD.  . TUBAL LIGATION    . WRIST FUSION      There were no vitals filed for this visit.  Subjective Assessment - 10/14/18 1016    Subjective  COVID-19 screen performed prior to patient entering clinic.  Not doing well today due to having a lot of pain in My RT LE hip and knee. To MD Thursday.    Pertinent History  HTN, R TKA 09/02/2018, Rheumatoid Arthritis    Limitations  Sitting;Standing;Walking;House hold activities    Diagnostic tests  x-ray    Patient Stated Goals  get rid of pain, walk without walker    Currently in Pain?  Yes    Pain Score  8     Pain  Location  Knee    Pain Orientation  Right                       [facility] Adult PT Treatment/Exercise - 10/14/18 0001      Exercises   Exercises  Knee/Hip      Modalities   Modalities  Electrical Stimulation;Vasopneumatic      Electrical Stimulation   Electrical Stimulation Location  Right knee.    Electrical Stimulation Action  IFC    Electrical Stimulation Parameters  80-150hz  x 20 mins    Electrical Stimulation Goals  Edema;Pain      Vasopneumatic   Number Minutes Vasopneumatic   20 minutes    Vasopnuematic Location   --   Right knee.   Vasopneumatic Pressure  Low    Vasopneumatic Temperature   34      Manual Therapy   Passive ROM  Gentle PROM to LT LE  for pain relief               PT Short Term Goals - 10/03/18 1138      PT SHORT TERM GOAL #1  Title  Patient will be independent with HEP    Time  2    Period  Weeks    Status  Achieved      PT SHORT TERM GOAL #2   Title  Patient will demonstrate 90+ degrees of right knee flexion AROM to improve ROM for functional tasks    Time  2    Period  Weeks    Status  Achieved      PT SHORT TERM GOAL #3   Title  Patient will demonstrate 10 degrees or less of right knee extension AROM to improve gait mechanics.    Time  2    Period  Weeks    Status  Achieved        PT Long Term Goals - 10/03/18 1138      PT LONG TERM GOAL #1   Time  4    Period  Weeks    Status  On-going      PT LONG TERM GOAL #2   Title  Patient will demonstrate 115+ degrees of right knee flexion AROM to improve ability to perform functoinal tasks    Time  4    Period  Weeks    Status  On-going      PT LONG TERM GOAL #3   Title  Patient will demonstrate 5 degrees or less of right knee extension AROM to improve gait mechanics.    Period  Weeks    Status  On-going      PT LONG TERM GOAL #4   Title  Patient will report ability to ambulate front steps with 1 rail and reciprocating gait pattern to safely enter/exit home.     Period  Weeks    Status  On-going      PT LONG TERM GOAL #5   Title  Patient will demonstrate 4+/5 or greater right knee MMT in all planes to improve stability during functional tasks.    Period  Weeks    Status  On-going            Plan - 10/14/18 1024    Clinical Impression Statement  Pt not doing wellt the last 2 nights and today due to shrp pains in LT LE from her hip and into her knee and has to use her SPC again to walk. . No therex today due to high pain levels.  Gentle PROM was performed to LT LE for pain/tension relief f/b vaso and Estim. Last knee flexion ROM to 120 degrees.    Personal Factors and Comorbidities  Age;Comorbidity 1    Comorbidities  HTN, R TKA 09/02/2018    Stability/Clinical Decision Making  Stable/Uncomplicated    Rehab Potential  Good    PT Frequency  2x / week    PT Duration  6 weeks    PT Treatment/Interventions  ADLs/Self Care Home Management;Cryotherapy;Electrical Stimulation;Moist Heat;Gait training;Stair training;Functional mobility training;Therapeutic activities;Therapeutic exercise;Balance training;Passive range of motion;Patient/family education;Neuromuscular re-education;Manual techniques;Taping;Vasopneumatic Device    PT Next Visit Plan  cont with gentle PRE's, ROM and modalities PRN. TO MD Thursday    Consulted and Agree with Plan of Care  Patient       Patient will benefit from skilled therapeutic intervention in order to improve the following deficits and impairments:  Decreased activity tolerance, Decreased range of motion, Decreased strength, Difficulty walking, Increased edema, Pain, Decreased knowledge of precautions  Visit Diagnosis: Acute pain of right knee  Stiffness of right knee, not elsewhere classified  Difficulty in walking, not  elsewhere classified  Muscle weakness (generalized)     Problem List Patient Active Problem List   Diagnosis Date Noted  . Obese 09/03/2018  . 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 PAC (premature  atrial contraction) 07/11/2017  . History of hypothyroidism 06/11/2017  . Essential hypertension 06/11/2017  . History of obesity 06/11/2017  . S/P right knee arthroscopy 06/11/2017  . DDD (degenerative disc disease), cervical 06/11/2017  . DDD (degenerative disc disease), lumbar 06/11/2017  . History of carpal tunnel surgery of right wrist 06/11/2017  . Primary osteoarthritis of both knees 06/11/2017  . Chest pain, precordial 05/05/2017  . PVC's (premature ventricular contractions) 05/03/2017  . Family history of premature CAD 05/03/2017  . 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 Uveitis 10/10/2015  . Diverticulitis 10/10/2015  . Dyspnea 12/05/2010  . Palpitations 12/05/2010    Esty Ahuja,CHRIS, PTA 10/14/2018, 11:42 AM  [facility] [place], [place], [address] Phone: [phone]   Fax:  [phone]  Name: Kiara Miller MRN: [id number] Date of Birth: 1950-04-23

## 2018-10-16 ENCOUNTER — Encounter: Payer: Medicare Other | Admitting: Physical Therapy

## 2018-10-17 ENCOUNTER — Other Ambulatory Visit: Payer: Self-pay

## 2018-10-17 ENCOUNTER — Encounter: Payer: Self-pay | Admitting: Physical Therapy

## 2018-10-17 ENCOUNTER — Ambulatory Visit: Payer: Medicare Other | Admitting: Physical Therapy

## 2018-10-17 DIAGNOSIS — M6281 Muscle weakness (generalized): Secondary | ICD-10-CM

## 2018-10-17 DIAGNOSIS — M25661 Stiffness of right knee, not elsewhere classified: Secondary | ICD-10-CM

## 2018-10-17 DIAGNOSIS — R262 Difficulty in walking, not elsewhere classified: Secondary | ICD-10-CM

## 2018-10-17 DIAGNOSIS — M25561 Pain in right knee: Secondary | ICD-10-CM

## 2018-10-17 NOTE — Therapy (Signed)
[facility] [place], [place], [address] Phone: [phone]   Fax:  [phone]  Physical Therapy Treatment  Patient Details  Name: Kiara Miller MRN: [id number] Date of Birth: [birth year] Referring Provider (PT): Danae Orleans, PA-C/ Alvan Dame, MD   Encounter Date: 10/17/2018  PT End of Session - 10/17/18 1044    Visit Number  15    Number of Visits  18    Date for PT Re-Evaluation  10/31/18    PT Start Time  1034    PT Stop Time  1123    PT Time Calculation (min)  49 min    Activity Tolerance  Patient tolerated treatment well    Behavior During Therapy  [facility] for tasks assessed/performed       Past Medical History:  Diagnosis Date  . Diverticulitis April 2017  . Hyperlipidemia   . Hypertension   . Joint pain   . Polyarthralgia   . Right knee pain     Past Surgical History:  Procedure Laterality Date  . ABDOMINAL HYSTERECTOMY    . CARPAL TUNNEL RELEASE    . MANDIBLE SURGERY    . NM MYOVIEW LTD  06/2017   (Ordered for coronary calcium score 376) LOW RISK.  EF 72%.  No ischemia or infarction.  No wall motion normality.  No change from previous.  . OSTEOTOMY PELVIS BILATERAL    . TONSILLECTOMY    . TONSILLECTOMY    . 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 TRANSTHORACIC ECHOCARDIOGRAM  12/2010   EF 65-70%. Mild LVH. No RWMA/ Gr1 DD.  . TUBAL LIGATION    . WRIST FUSION      There were no vitals filed for this visit.  Subjective Assessment - 10/17/18 1043    Subjective  COVID 19 screening performed on patient upon arrival. Reported dificulty with stationary bike as she had not been on it in overa week. Reports she was prescribed zanaflex and only took (1/2) pill last night with no improvement noted. Has been standing for prolonged period of time this morning in her kitchen.     Pertinent History  HTN, R TKA 09/02/2018, Rheumatoid Arthritis    Limitations  Sitting;Standing;Walking;House hold activities    Diagnostic tests  x-ray    Patient Stated Goals  get rid of pain, walk without walker    Currently in Pain?  Yes    Pain Score  4     Pain Location  Knee    Pain Orientation  Right    Pain Descriptors / Indicators  Discomfort    Pain Type  Surgical pain    Pain Onset  More than a month ago    Pain Frequency  Constant         [facility] PT Assessment - 10/17/18 0001      Assessment   Medical Diagnosis  Unilateral primary osteoarthritis, right knee    Referring Provider (PT)  Danae Orleans, PA-C/ Alvan Dame, MD    Onset Date/Surgical Date  09/02/18    Next MD Visit  11/27/2018    Prior Therapy  no      Precautions   Precautions  Other (comment)    Precaution Comments  no ultrasound      Restrictions   Weight Bearing Restrictions  No  [facility] Adult PT Treatment/Exercise - 10/17/18 0001      Knee/Hip Exercises: Aerobic   Stationary Bike  L1, seat 3 x10 min      Knee/Hip Exercises: Machines for Strengthening   Cybex Knee Extension  10# x20 reps    Cybex Knee Flexion  30# x20 reps      Knee/Hip Exercises: Standing   Forward Lunges  Right;15 reps;2 seconds    Hip Abduction  AROM;Right;20 reps;Knee straight    Forward Step Up  Right;20 reps;Hand Hold: 2;Step Height: 6"      Modalities   Modalities  Programmer, applications Location  R ITB    Electrical Stimulation Action  IFC    Electrical Stimulation Parameters  80-150 hz x10 min    Electrical Stimulation Goals  Pain;Tone      Vasopneumatic   Number Minutes Vasopneumatic   10 minutes    Vasopnuematic Location   Knee    Vasopneumatic Pressure  Low    Vasopneumatic Temperature   34               PT Short Term Goals - 10/03/18 1138      PT SHORT TERM GOAL #1   Title  Patient will be  independent with HEP    Time  2    Period  Weeks    Status  Achieved      PT SHORT TERM GOAL #2   Title  Patient will demonstrate 90+ degrees of right knee flexion AROM to improve ROM for functional tasks    Time  2    Period  Weeks    Status  Achieved      PT SHORT TERM GOAL #3   Title  Patient will demonstrate 10 degrees or less of right knee extension AROM to improve gait mechanics.    Time  2    Period  Weeks    Status  Achieved        PT Long Term Goals - 10/17/18 1045      PT LONG TERM GOAL #1   Title  Patient will be independent with advanced HEP    Time  4    Period  Weeks    Status  On-going      PT LONG TERM GOAL #2   Title  Patient will demonstrate 115+ degrees of right knee flexion AROM to improve ability to perform functoinal tasks    Time  4    Period  Weeks    Status  On-going      PT LONG TERM GOAL #3   Title  Patient will demonstrate 5 degrees or less of right knee extension AROM to improve gait mechanics.    Period  Weeks    Status  On-going      PT LONG TERM GOAL #4   Title  Patient will report ability to ambulate front steps with 1 rail and reciprocating gait pattern to safely enter/exit home.    Period  Weeks    Status  On-going      PT LONG TERM GOAL #5   Title  Patient will demonstrate 4+/5 or greater right knee MMT in all planes to improve stability during functional tasks.    Period  Weeks    Status  On-going            Plan - 10/17/18 1124    Clinical Impression Statement  Patient presented in clinic with  stiffness initially with stationary bike. Patient able to complete exercises well with greater weakness with knee extension. Increased tightness of R ITB and lateral HS palpable with patient had internittant pain with rolling to L in bed. AROM of L knee measured as 3-122 deg. Normal modalities response noted following removal of the modalities.    Personal Factors and Comorbidities  Age;Comorbidity 1    Comorbidities  HTN, R TKA  09/02/2018    Examination-Activity Limitations  Bed Mobility;Dressing;Stand;Stairs;Squat;Sit;Transfers;Locomotion Level    Examination-Participation Restrictions  Cleaning;Driving    Stability/Clinical Decision Making  Stable/Uncomplicated    Rehab Potential  Good    PT Frequency  2x / week    PT Treatment/Interventions  ADLs/Self Care Home Management;Cryotherapy;Electrical Stimulation;Moist Heat;Gait training;Stair training;Functional mobility training;Therapeutic activities;Therapeutic exercise;Balance training;Passive range of motion;Patient/family education;Neuromuscular re-education;Manual techniques;Taping;Vasopneumatic Device    PT Next Visit Plan  cont with gentle PRE's, ROM and modalities PRN. TO MD Thursday    Consulted and Agree with Plan of Care  Patient       Patient will benefit from skilled therapeutic intervention in order to improve the following deficits and impairments:  Decreased activity tolerance, Decreased range of motion, Decreased strength, Difficulty walking, Increased edema, Pain, Decreased knowledge of precautions  Visit Diagnosis: Acute pain of right knee  Stiffness of right knee, not elsewhere classified  Difficulty in walking, not elsewhere classified  Muscle weakness (generalized)     Problem List Patient Active Problem List   Diagnosis Date Noted  . Obese 09/03/2018  . 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 PAC (premature atrial contraction) 07/11/2017  . History of hypothyroidism 06/11/2017  . Essential hypertension 06/11/2017  . History of obesity 06/11/2017  . S/P right knee arthroscopy 06/11/2017  . DDD (degenerative disc disease), cervical 06/11/2017  . DDD (degenerative disc disease), lumbar 06/11/2017  . History of carpal tunnel surgery of right wrist 06/11/2017  . Primary osteoarthritis of both knees 06/11/2017  . Chest pain, precordial 05/05/2017  . PVC's (premature ventricular contractions) 05/03/2017  . Family history of premature CAD  05/03/2017  . 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 Uveitis 10/10/2015  . Diverticulitis 10/10/2015  . Dyspnea 12/05/2010  . Palpitations 12/05/2010    Marvell Fuller, PTA 10/17/2018, 12:20 PM  [facility] [address] [place], [place], [address] Phone: [phone]   Fax:  [phone]  Name: Kiara Miller MRN: [id number] Date of Birth: 03/18/1950

## 2018-10-21 ENCOUNTER — Encounter: Payer: Self-pay | Admitting: Physical Therapy

## 2018-10-21 ENCOUNTER — Other Ambulatory Visit: Payer: Self-pay

## 2018-10-21 ENCOUNTER — Ambulatory Visit: Payer: Medicare Other | Admitting: Physical Therapy

## 2018-10-21 DIAGNOSIS — M6281 Muscle weakness (generalized): Secondary | ICD-10-CM

## 2018-10-21 DIAGNOSIS — M25661 Stiffness of right knee, not elsewhere classified: Secondary | ICD-10-CM

## 2018-10-21 DIAGNOSIS — M25561 Pain in right knee: Secondary | ICD-10-CM

## 2018-10-21 DIAGNOSIS — R262 Difficulty in walking, not elsewhere classified: Secondary | ICD-10-CM

## 2018-10-21 NOTE — Addendum Note (Signed)
Addended by: Gabriela Eves on: 10/21/2018 05:36 PM   Modules accepted: Orders

## 2018-10-21 NOTE — Therapy (Signed)
[facility] [address] [place], [place], [address] Phone: [phone]   Fax:  [phone]  Physical Therapy Treatment  Patient Details  Name: Kiara Miller MRN: [id number] Date of Birth: 21-Jul-1950 Referring Provider (PT): Lanney Gins, PA-C/ Charlann Boxer, MD   Encounter Date: 10/21/2018  PT End of Session - 10/21/18 1238    Visit Number  16    Number of Visits  24    Date for PT Re-Evaluation  11/28/18    PT Start Time  1115    PT Stop Time  1206    PT Time Calculation (min)  51 min    Activity Tolerance  Patient tolerated treatment well    Behavior During Therapy  [facility] for tasks assessed/performed       Past Medical History:  Diagnosis Date  . Diverticulitis April 2017  . Hyperlipidemia   . Hypertension   . Joint pain   . Polyarthralgia   . Right knee pain     Past Surgical History:  Procedure Laterality Date  . ABDOMINAL HYSTERECTOMY    . CARPAL TUNNEL RELEASE    . MANDIBLE SURGERY    . NM MYOVIEW LTD  06/2017   (Ordered for coronary calcium score 376) LOW RISK.  EF 72%.  No ischemia or infarction.  No wall motion normality.  No change from previous.  . OSTEOTOMY PELVIS BILATERAL    . TONSILLECTOMY    . TONSILLECTOMY    . TOTAL KNEE ARTHROPLASTY Right 09/02/2018   Procedure: TOTAL KNEE ARTHROPLASTY;  Surgeon: Durene Romans, MD;  Location: [facility];  Service: Orthopedics;  Laterality: Right;  70 mins  . TRANSTHORACIC ECHOCARDIOGRAM  12/2010   EF 65-70%. Mild LVH. No RWMA/ Gr1 DD.  . TUBAL LIGATION    . WRIST FUSION      There were no vitals filed for this visit.  Subjective Assessment - 10/21/18 1235    Subjective  COVID 19 screening performed on patient upon arrival. Reported feeling good but R ITB still causes some trouble.    Pertinent History  HTN, R TKA 09/02/2018, Rheumatoid Arthritis    Limitations  Sitting;Standing;Walking;House hold activities    Diagnostic tests  x-ray    Patient Stated Goals  get rid of  pain, walk without walker    Currently in Pain?  Yes    Pain Score  4     Pain Location  Knee    Pain Orientation  Right    Pain Descriptors / Indicators  Discomfort    Pain Type  Surgical pain    Pain Onset  More than a month ago    Pain Frequency  Constant         [facility] PT Assessment - 10/21/18 0001      Assessment   Medical Diagnosis  Unilateral primary osteoarthritis, right knee    Referring Provider (PT)  Lanney Gins, PA-C/ Charlann Boxer, MD    Onset Date/Surgical Date  09/02/18    Next MD Visit  11/27/2018    Prior Therapy  no      Precautions   Precautions  Other (comment)    Precaution Comments  no ultrasound                   [facility] Adult PT Treatment/Exercise - 10/21/18 0001      Exercises   Exercises  Knee/Hip      Knee/Hip Exercises: Aerobic   Stationary Bike  L1, seat 3 x10 min  Knee/Hip Exercises: Machines for Strengthening   Cybex Knee Extension  10# x30 reps    Cybex Knee Flexion  30# x30 reps    Cybex Leg Press  2 plates x30      Modalities   Modalities  Electrical Stimulation;Vasopneumatic      Electrical Stimulation   Electrical Stimulation Location  R ITB    Electrical Stimulation Action  IFC    Electrical Stimulation Parameters  80-150 hz x10 mins    Electrical Stimulation Goals  Pain;Tone      Vasopneumatic   Number Minutes Vasopneumatic   10 minutes    Vasopnuematic Location   Knee    Vasopneumatic Pressure  Low    Vasopneumatic Temperature   34      Manual Therapy   Manual Therapy  Passive ROM    Soft tissue mobilization  moderate STW to ITB to decrease soreness and trigger points               PT Short Term Goals - 10/03/18 1138      PT SHORT TERM GOAL #1   Title  Patient will be independent with HEP    Time  2    Period  Weeks    Status  Achieved      PT SHORT TERM GOAL #2   Title  Patient will demonstrate 90+ degrees of right knee flexion AROM to improve ROM for functional tasks    Time  2    Period   Weeks    Status  Achieved      PT SHORT TERM GOAL #3   Title  Patient will demonstrate 10 degrees or less of right knee extension AROM to improve gait mechanics.    Time  2    Period  Weeks    Status  Achieved        PT Long Term Goals - 10/17/18 1045      PT LONG TERM GOAL #1   Title  Patient will be independent with advanced HEP    Time  4    Period  Weeks    Status  On-going      PT LONG TERM GOAL #2   Title  Patient will demonstrate 115+ degrees of right knee flexion AROM to improve ability to perform functoinal tasks    Time  4    Period  Weeks    Status  On-going      PT LONG TERM GOAL #3   Title  Patient will demonstrate 5 degrees or less of right knee extension AROM to improve gait mechanics.    Period  Weeks    Status  On-going      PT LONG TERM GOAL #4   Title  Patient will report ability to ambulate front steps with 1 rail and reciprocating gait pattern to safely enter/exit home.    Period  Weeks    Status  On-going      PT LONG TERM GOAL #5   Title  Patient will demonstrate 4+/5 or greater right knee MMT in all planes to improve stability during functional tasks.    Period  Weeks    Status  On-going            Plan - 10/21/18 1239    Clinical Impression Statement  Patient responded well to therapy and reports she is able to perform full revolutions without warming up. Patient reported improved ITB tone but still has some difficulties with it especially with left  rolling and with knee bent. Re-cert sent to address ongoing deficts. Normal response to modalities upon removal.    Personal Factors and Comorbidities  Age;Comorbidity 1    Comorbidities  HTN, R TKA 09/02/2018    Examination-Activity Limitations  Bed Mobility;Dressing;Stand;Stairs;Squat;Sit;Transfers;Locomotion Level    Examination-Participation Restrictions  Cleaning;Driving    Stability/Clinical Decision Making  Stable/Uncomplicated    Clinical Decision Making  Low    Rehab Potential  Good     PT Frequency  2x / week    PT Duration  6 weeks    PT Treatment/Interventions  ADLs/Self Care Home Management;Cryotherapy;Electrical Stimulation;Moist Heat;Gait training;Stair training;Functional mobility training;Therapeutic activities;Therapeutic exercise;Balance training;Passive range of motion;Patient/family education;Neuromuscular re-education;Manual techniques;Taping;Vasopneumatic Device    PT Next Visit Plan  cont with gentle PRE's, ROM and modalities PRN.    Consulted and Agree with Plan of Care  Patient       Patient will benefit from skilled therapeutic intervention in order to improve the following deficits and impairments:  Decreased activity tolerance, Decreased range of motion, Decreased strength, Difficulty walking, Increased edema, Pain, Decreased knowledge of precautions  Visit Diagnosis: Acute pain of right knee  Stiffness of right knee, not elsewhere classified  Difficulty in walking, not elsewhere classified  Muscle weakness (generalized)     Problem List Patient Active Problem List   Diagnosis Date Noted  . Obese 09/03/2018  . 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 PAC (premature atrial contraction) 07/11/2017  . History of hypothyroidism 06/11/2017  . Essential hypertension 06/11/2017  . History of obesity 06/11/2017  . S/P right knee arthroscopy 06/11/2017  . DDD (degenerative disc disease), cervical 06/11/2017  . DDD (degenerative disc disease), lumbar 06/11/2017  . History of carpal tunnel surgery of right wrist 06/11/2017  . Primary osteoarthritis of both knees 06/11/2017  . Chest pain, precordial 05/05/2017  . PVC's (premature ventricular contractions) 05/03/2017  . Family history of premature CAD 05/03/2017  . 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 Uveitis 10/10/2015  . Diverticulitis 10/10/2015  . Dyspnea 12/05/2010  . Palpitations 12/05/2010    Guss Bunde, PT, DPT 10/21/2018, 12:45 PM  [facility] [address] [place], [place], [address] Phone: [phone]   Fax:  [phone]  Name: Kiara Miller MRN: [id number] Date of Birth: 1950/06/18

## 2018-10-22 ENCOUNTER — Other Ambulatory Visit: Payer: Self-pay | Admitting: Family Medicine

## 2018-10-22 DIAGNOSIS — Z1231 Encounter for screening mammogram for malignant neoplasm of breast: Secondary | ICD-10-CM

## 2018-10-24 ENCOUNTER — Ambulatory Visit: Payer: Medicare Other | Admitting: *Deleted

## 2018-10-24 ENCOUNTER — Other Ambulatory Visit: Payer: Self-pay

## 2018-10-24 DIAGNOSIS — M25561 Pain in right knee: Secondary | ICD-10-CM

## 2018-10-24 DIAGNOSIS — M25661 Stiffness of right knee, not elsewhere classified: Secondary | ICD-10-CM

## 2018-10-24 NOTE — Therapy (Signed)
[facility] [address] [place], [place], [address] Phone: [phone]   Fax:  [phone]  Physical Therapy Treatment  Patient Details  Name: Kiara Miller MRN: [id number] Date of Birth: May 04, 1950 Referring Provider (PT): Lanney Gins, PA-C/ Charlann Boxer, MD   Encounter Date: 10/24/2018  PT End of Session - 10/24/18 1043    Visit Number  17    Number of Visits  24    Date for PT Re-Evaluation  11/28/18    PT Start Time  1030    PT Stop Time  1125    PT Time Calculation (min)  55 min       Past Medical History:  Diagnosis Date  . Diverticulitis April 2017  . Hyperlipidemia   . Hypertension   . Joint pain   . Polyarthralgia   . Right knee pain     Past Surgical History:  Procedure Laterality Date  . ABDOMINAL HYSTERECTOMY    . CARPAL TUNNEL RELEASE    . MANDIBLE SURGERY    . NM MYOVIEW LTD  06/2017   (Ordered for coronary calcium score 376) LOW RISK.  EF 72%.  No ischemia or infarction.  No wall motion normality.  No change from previous.  . OSTEOTOMY PELVIS BILATERAL    . TONSILLECTOMY    . TONSILLECTOMY    . TOTAL KNEE ARTHROPLASTY Right 09/02/2018   Procedure: TOTAL KNEE ARTHROPLASTY;  Surgeon: Durene Romans, MD;  Location: [facility];  Service: Orthopedics;  Laterality: Right;  70 mins  . TRANSTHORACIC ECHOCARDIOGRAM  12/2010   EF 65-70%. Mild LVH. No RWMA/ Gr1 DD.  . TUBAL LIGATION    . WRIST FUSION      There were no vitals filed for this visit.  Subjective Assessment - 10/24/18 1040    Subjective  COVID 19 screening performed on patient upon arrival. RT knee 2/10. Doing better.  Standing for any length of time is the most challenging.  MD on Monday    Pertinent History  HTN, R TKA 09/02/2018, Rheumatoid Arthritis    Limitations  Sitting;Standing;Walking;House hold activities    Patient Stated Goals  get rid of pain, walk without walker    Currently in Pain?  Yes    Pain Score  2     Pain Location  Knee    Pain  Orientation  Right    Pain Descriptors / Indicators  Discomfort    Pain Type  Surgical pain    Pain Onset  More than a month ago                       [facility] Adult PT Treatment/Exercise - 10/24/18 0001      Exercises   Exercises  Knee/Hip      Knee/Hip Exercises: Aerobic   Stationary Bike  L4, seat 3 x10 min      Knee/Hip Exercises: Machines for Strengthening   Cybex Knee Extension  30# x30 reps    Cybex Knee Flexion  40# x30 reps    Cybex Leg Press  2 plates L46      Modalities   Modalities  Electrical Stimulation;Vasopneumatic      Electrical Stimulation   Electrical Stimulation Location  RT ITB    Electrical Stimulation Action  IFC    Electrical Stimulation Parameters  80-150hz  x 10 mins    Electrical Stimulation Goals  Pain;Tone      Vasopneumatic   Number Minutes Vasopneumatic   10 minutes  Vasopnuematic Location   Knee    Vasopneumatic Pressure  Low    Vasopneumatic Temperature   34      Manual Therapy   Manual Therapy  Passive ROM    Soft tissue mobilization  moderate STW to ITB to decrease soreness and trigger points               PT Short Term Goals - 10/03/18 1138      PT SHORT TERM GOAL #1   Title  Patient will be independent with HEP    Time  2    Period  Weeks    Status  Achieved      PT SHORT TERM GOAL #2   Title  Patient will demonstrate 90+ degrees of right knee flexion AROM to improve ROM for functional tasks    Time  2    Period  Weeks    Status  Achieved      PT SHORT TERM GOAL #3   Title  Patient will demonstrate 10 degrees or less of right knee extension AROM to improve gait mechanics.    Time  2    Period  Weeks    Status  Achieved        PT Long Term Goals - 10/17/18 1045      PT LONG TERM GOAL #1   Title  Patient will be independent with advanced HEP    Time  4    Period  Weeks    Status  On-going      PT LONG TERM GOAL #2   Title  Patient will demonstrate 115+ degrees of right knee flexion AROM  to improve ability to perform functoinal tasks    Time  4    Period  Weeks    Status  On-going      PT LONG TERM GOAL #3   Title  Patient will demonstrate 5 degrees or less of right knee extension AROM to improve gait mechanics.    Period  Weeks    Status  On-going      PT LONG TERM GOAL #4   Title  Patient will report ability to ambulate front steps with 1 rail and reciprocating gait pattern to safely enter/exit home.    Period  Weeks    Status  On-going      PT LONG TERM GOAL #5   Title  Patient will demonstrate 4+/5 or greater right knee MMT in all planes to improve stability during functional tasks.    Period  Weeks    Status  On-going            Plan - 10/24/18 1227    Clinical Impression Statement  Pt arrived today reporting RT knee is doing better, but ITB continues to stay sore/ painful. She was able to complete all therex for RT LE strengthening and ROM with minimal complaints. Her CC is that she still gets a sharp pain when lifting RT LE up in the air  when she is on her LT side and the knee is Bent. Normal modality response today.    Personal Factors and Comorbidities  Age;Comorbidity 1    Comorbidities  HTN, R TKA 09/02/2018    Stability/Clinical Decision Making  Stable/Uncomplicated    Rehab Potential  Good    PT Frequency  2x / week    PT Duration  6 weeks    PT Treatment/Interventions  ADLs/Self Care Home Management;Cryotherapy;Electrical Stimulation;Moist Heat;Gait training;Stair training;Functional mobility training;Therapeutic activities;Therapeutic exercise;Balance training;Passive range  of motion;Patient/family education;Neuromuscular re-education;Manual techniques;Taping;Vasopneumatic Device    PT Next Visit Plan  cont with gentle PRE's, ROM and modalities PRN.    Consulted and Agree with Plan of Care  Patient       Patient will benefit from skilled therapeutic intervention in order to improve the following deficits and impairments:  Decreased activity  tolerance, Decreased range of motion, Decreased strength, Difficulty walking, Increased edema, Pain, Decreased knowledge of precautions  Visit Diagnosis: Acute pain of right knee  Stiffness of right knee, not elsewhere classified     Problem List Patient Active Problem List   Diagnosis Date Noted  . Obese 09/03/2018  . 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 PAC (premature atrial contraction) 07/11/2017  . History of hypothyroidism 06/11/2017  . Essential hypertension 06/11/2017  . History of obesity 06/11/2017  . S/P right knee arthroscopy 06/11/2017  . DDD (degenerative disc disease), cervical 06/11/2017  . DDD (degenerative disc disease), lumbar 06/11/2017  . History of carpal tunnel surgery of right wrist 06/11/2017  . Primary osteoarthritis of both knees 06/11/2017  . Chest pain, precordial 05/05/2017  . PVC's (premature ventricular contractions) 05/03/2017  . Family history of premature CAD 05/03/2017  . 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 Uveitis 10/10/2015  . Diverticulitis 10/10/2015  . Dyspnea 12/05/2010  . Palpitations 12/05/2010    Dylann Gallier,CHRIS, PTA 10/24/2018, 12:32 PM  [facility] [address] [place], [place], [address] Phone: [phone]   Fax:  [phone]  Name: Kiara Miller MRN: [id number] Date of Birth: Mar 20, 1950

## 2018-10-28 ENCOUNTER — Encounter: Payer: Self-pay | Admitting: Physical Therapy

## 2018-10-28 ENCOUNTER — Other Ambulatory Visit: Payer: Self-pay

## 2018-10-28 ENCOUNTER — Ambulatory Visit: Payer: Medicare Other | Admitting: Physical Therapy

## 2018-10-28 DIAGNOSIS — M25561 Pain in right knee: Secondary | ICD-10-CM

## 2018-10-28 DIAGNOSIS — M25661 Stiffness of right knee, not elsewhere classified: Secondary | ICD-10-CM

## 2018-10-28 DIAGNOSIS — M6281 Muscle weakness (generalized): Secondary | ICD-10-CM

## 2018-10-28 DIAGNOSIS — R262 Difficulty in walking, not elsewhere classified: Secondary | ICD-10-CM

## 2018-10-28 NOTE — Therapy (Signed)
[facility] [address] [place], [place], [address] Phone: [phone]   Fax:  [phone]  Physical Therapy Treatment  Patient Details  Name: Kiara Miller MRN: [id number] Date of Birth: 08-Jan-1951 Referring Provider (PT): Lanney Gins, PA-C/ Charlann Boxer, MD   Encounter Date: 10/28/2018  PT End of Session - 10/28/18 1439    Visit Number  18    Number of Visits  24    Date for PT Re-Evaluation  11/28/18    PT Start Time  1431    PT Stop Time  1516    PT Time Calculation (min)  45 min    Activity Tolerance  Patient tolerated treatment well    Behavior During Therapy  [facility] for tasks assessed/performed       Past Medical History:  Diagnosis Date  . Diverticulitis April 2017  . Hyperlipidemia   . Hypertension   . Joint pain   . Polyarthralgia   . Right knee pain     Past Surgical History:  Procedure Laterality Date  . ABDOMINAL HYSTERECTOMY    . CARPAL TUNNEL RELEASE    . MANDIBLE SURGERY    . NM MYOVIEW LTD  06/2017   (Ordered for coronary calcium score 376) LOW RISK.  EF 72%.  No ischemia or infarction.  No wall motion normality.  No change from previous.  . OSTEOTOMY PELVIS BILATERAL    . TONSILLECTOMY    . TONSILLECTOMY    . TOTAL KNEE ARTHROPLASTY Right 09/02/2018   Procedure: TOTAL KNEE ARTHROPLASTY;  Surgeon: Durene Romans, MD;  Location: [facility];  Service: Orthopedics;  Laterality: Right;  70 mins  . TRANSTHORACIC ECHOCARDIOGRAM  12/2010   EF 65-70%. Mild LVH. No RWMA/ Gr1 DD.  . TUBAL LIGATION    . WRIST FUSION      There were no vitals filed for this visit.  Subjective Assessment - 10/28/18 1440    Subjective  COVID 19 screening performed on patient upon arrival. Patient arrives stating she ran a lot of errands today and is hurting more in the back of the knee. Reports her knee still feels "weird" especially with standing.    Pertinent History  HTN, R TKA 09/02/2018, Rheumatoid Arthritis    Limitations   Sitting;Standing;Walking;House hold activities    Diagnostic tests  x-ray    Patient Stated Goals  get rid of pain, walk without walker    Currently in Pain?  Yes    Pain Score  --    Pain Location  Knee    Pain Orientation  Right    Pain Descriptors / Indicators  Discomfort    Pain Type  Surgical pain    Pain Onset  More than a month ago    Pain Frequency  Constant         [facility] PT Assessment - 10/28/18 0001      Assessment   Medical Diagnosis  Unilateral primary osteoarthritis, right knee    Referring Provider (PT)  Lanney Gins, PA-C/ Charlann Boxer, MD    Onset Date/Surgical Date  09/02/18    Next MD Visit  11/27/2018    Prior Therapy  no      Precautions   Precautions  Other (comment)    Precaution Comments  no ultrasound                   [facility] Adult PT Treatment/Exercise - 10/28/18 0001      Exercises   Exercises  Knee/Hip  Knee/Hip Exercises: Aerobic   Stationary Bike  L3, seat 3 x12 min      Knee/Hip Exercises: Machines for Strengthening   Cybex Knee Extension  30# x3 minutes    Cybex Knee Flexion  40# x3 minutes    Cybex Leg Press  2 plates x30      Modalities   Modalities  Electrical Stimulation;Vasopneumatic      Electrical Stimulation   Electrical Stimulation Location  RT ITB    Electrical Stimulation Action  IFC    Electrical Stimulation Parameters  80-150 hz x10 mins    Electrical Stimulation Goals  Pain;Tone      Vasopneumatic   Number Minutes Vasopneumatic   10 minutes    Vasopnuematic Location   Knee    Vasopneumatic Pressure  Low    Vasopneumatic Temperature   34               PT Short Term Goals - 10/03/18 1138      PT SHORT TERM GOAL #1   Title  Patient will be independent with HEP    Time  2    Period  Weeks    Status  Achieved      PT SHORT TERM GOAL #2   Title  Patient will demonstrate 90+ degrees of right knee flexion AROM to improve ROM for functional tasks    Time  2    Period  Weeks    Status  Achieved       PT SHORT TERM GOAL #3   Title  Patient will demonstrate 10 degrees or less of right knee extension AROM to improve gait mechanics.    Time  2    Period  Weeks    Status  Achieved        PT Long Term Goals - 10/17/18 1045      PT LONG TERM GOAL #1   Title  Patient will be independent with advanced HEP    Time  4    Period  Weeks    Status  On-going      PT LONG TERM GOAL #2   Title  Patient will demonstrate 115+ degrees of right knee flexion AROM to improve ability to perform functoinal tasks    Time  4    Period  Weeks    Status  On-going      PT LONG TERM GOAL #3   Title  Patient will demonstrate 5 degrees or less of right knee extension AROM to improve gait mechanics.    Period  Weeks    Status  On-going      PT LONG TERM GOAL #4   Title  Patient will report ability to ambulate front steps with 1 rail and reciprocating gait pattern to safely enter/exit home.    Period  Weeks    Status  On-going      PT LONG TERM GOAL #5   Title  Patient will demonstrate 4+/5 or greater right knee MMT in all planes to improve stability during functional tasks.    Period  Weeks    Status  On-going            Plan - 10/28/18 1608    Clinical Impression Statement  Patient arrives feeling discomfort in right ITB and lateral hamstring. Patient was able to tolerate treatment but with rest breaks. Patient still feels discomfort with extended standing and was instructed to rest as needed to alleviate discomfort if possible. Patient reported understanding. Normal response to  modalities upon removal.    Personal Factors and Comorbidities  Age;Comorbidity 1    Comorbidities  HTN, R TKA 09/02/2018    Examination-Activity Limitations  Bed Mobility;Dressing;Stand;Stairs;Squat;Sit;Transfers;Locomotion Level    Examination-Participation Restrictions  Cleaning;Driving    Stability/Clinical Decision Making  Stable/Uncomplicated    Clinical Decision Making  Low    Rehab Potential  Good    PT  Frequency  2x / week    PT Duration  6 weeks    PT Treatment/Interventions  ADLs/Self Care Home Management;Cryotherapy;Electrical Stimulation;Moist Heat;Gait training;Stair training;Functional mobility training;Therapeutic activities;Therapeutic exercise;Balance training;Passive range of motion;Patient/family education;Neuromuscular re-education;Manual techniques;Taping;Vasopneumatic Device    PT Next Visit Plan  cont with gentle PRE's, ROM and modalities PRN.    Consulted and Agree with Plan of Care  Patient       Patient will benefit from skilled therapeutic intervention in order to improve the following deficits and impairments:  Decreased activity tolerance, Decreased range of motion, Decreased strength, Difficulty walking, Increased edema, Pain, Decreased knowledge of precautions  Visit Diagnosis: Acute pain of right knee  Stiffness of right knee, not elsewhere classified  Difficulty in walking, not elsewhere classified  Muscle weakness (generalized)     Problem List Patient Active Problem List   Diagnosis Date Noted  . Obese 09/03/2018  . 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 PAC (premature atrial contraction) 07/11/2017  . History of hypothyroidism 06/11/2017  . Essential hypertension 06/11/2017  . History of obesity 06/11/2017  . S/P right knee arthroscopy 06/11/2017  . DDD (degenerative disc disease), cervical 06/11/2017  . DDD (degenerative disc disease), lumbar 06/11/2017  . History of carpal tunnel surgery of right wrist 06/11/2017  . Primary osteoarthritis of both knees 06/11/2017  . Chest pain, precordial 05/05/2017  . PVC's (premature ventricular contractions) 05/03/2017  . Family history of premature CAD 05/03/2017  . 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 Uveitis 10/10/2015  . Diverticulitis 10/10/2015  . Dyspnea 12/05/2010  . Palpitations 12/05/2010    Guss Bunde, PT, DPT 10/28/2018, 4:58 PM  [facility] [address] [place], [place], [address] Phone: [phone]   Fax:  [phone]  Name: Kiara Miller MRN: [id number] Date of Birth: 1950/12/07

## 2018-10-31 ENCOUNTER — Encounter: Payer: Self-pay | Admitting: Physical Therapy

## 2018-10-31 ENCOUNTER — Other Ambulatory Visit: Payer: Self-pay

## 2018-10-31 ENCOUNTER — Ambulatory Visit: Payer: Medicare Other | Admitting: Physical Therapy

## 2018-10-31 DIAGNOSIS — R262 Difficulty in walking, not elsewhere classified: Secondary | ICD-10-CM

## 2018-10-31 DIAGNOSIS — M6281 Muscle weakness (generalized): Secondary | ICD-10-CM

## 2018-10-31 DIAGNOSIS — M25661 Stiffness of right knee, not elsewhere classified: Secondary | ICD-10-CM

## 2018-10-31 DIAGNOSIS — M25561 Pain in right knee: Secondary | ICD-10-CM | POA: Diagnosis not present

## 2018-10-31 NOTE — Therapy (Signed)
[facility] [address] [place], [place], [address] Phone: [phone]   Fax:  [phone]  Physical Therapy Treatment  Patient Details  Name: Kiara Miller MRN: [id number] Date of Birth: July 23, 1950 Referring Provider (PT): Lanney Gins, PA-C/ Charlann Boxer, MD   Encounter Date: 10/31/2018  PT End of Session - 10/31/18 1223    Visit Number  19    Number of Visits  24    Date for PT Re-Evaluation  11/28/18    PT Start Time  0950    PT Stop Time  1041    PT Time Calculation (min)  51 min    Activity Tolerance  Patient tolerated treatment well    Behavior During Therapy  [facility] for tasks assessed/performed       Past Medical History:  Diagnosis Date  . Diverticulitis April 2017  . Hyperlipidemia   . Hypertension   . Joint pain   . Polyarthralgia   . Right knee pain     Past Surgical History:  Procedure Laterality Date  . ABDOMINAL HYSTERECTOMY    . CARPAL TUNNEL RELEASE    . MANDIBLE SURGERY    . NM MYOVIEW LTD  06/2017   (Ordered for coronary calcium score 376) LOW RISK.  EF 72%.  No ischemia or infarction.  No wall motion normality.  No change from previous.  . OSTEOTOMY PELVIS BILATERAL    . TONSILLECTOMY    . TONSILLECTOMY    . TOTAL KNEE ARTHROPLASTY Right 09/02/2018   Procedure: TOTAL KNEE ARTHROPLASTY;  Surgeon: Durene Romans, MD;  Location: [facility];  Service: Orthopedics;  Laterality: Right;  70 mins  . TRANSTHORACIC ECHOCARDIOGRAM  12/2010   EF 65-70%. Mild LVH. No RWMA/ Gr1 DD.  . TUBAL LIGATION    . WRIST FUSION      There were no vitals filed for this visit.  Subjective Assessment - 10/31/18 1210    Subjective  COVID-19 screen performed prior to patient entering clinic.  I get stiff when I stand too long.    Pertinent History  HTN, R TKA 09/02/2018, Rheumatoid Arthritis    Limitations  Sitting;Standing;Walking;House hold activities    Diagnostic tests  x-ray    Patient Stated Goals  get rid of pain, walk without  walker    Currently in Pain?  Yes    Pain Score  2     Pain Location  Knee    Pain Orientation  Right    Pain Descriptors / Indicators  Discomfort    Pain Type  Surgical pain    Pain Onset  More than a month ago                       [facility] Adult PT Treatment/Exercise - 10/31/18 0001      Exercises   Exercises  Knee/Hip      Knee/Hip Exercises: Aerobic   Stationary Bike  Level 3 x 15 minutes.      Knee/Hip Exercises: Machines for Strengthening   Cybex Knee Extension  10# x 3 minutes.    Cybex Knee Flexion  30# x 3 minutes.    Cybex Leg Press  2 plates x 3 minutes.      Modalities   Modalities  Programmer, applications Location  RT knee.    Electrical Stimulation Action  IFC    Electrical Stimulation Parameters  80-150 Hz x 20 minutes.    Lobbyist  Stimulation Goals  Tone;Pain      Vasopneumatic   Number Minutes Vasopneumatic   20 minutes    Vasopnuematic Location   --   RT knee.   Vasopneumatic Pressure  Medium               PT Short Term Goals - 10/03/18 1138      PT SHORT TERM GOAL #1   Title  Patient will be independent with HEP    Time  2    Period  Weeks    Status  Achieved      PT SHORT TERM GOAL #2   Title  Patient will demonstrate 90+ degrees of right knee flexion AROM to improve ROM for functional tasks    Time  2    Period  Weeks    Status  Achieved      PT SHORT TERM GOAL #3   Title  Patient will demonstrate 10 degrees or less of right knee extension AROM to improve gait mechanics.    Time  2    Period  Weeks    Status  Achieved        PT Long Term Goals - 10/17/18 1045      PT LONG TERM GOAL #1   Title  Patient will be independent with advanced HEP    Time  4    Period  Weeks    Status  On-going      PT LONG TERM GOAL #2   Title  Patient will demonstrate 115+ degrees of right knee flexion AROM to improve ability to perform functoinal tasks     Time  4    Period  Weeks    Status  On-going      PT LONG TERM GOAL #3   Title  Patient will demonstrate 5 degrees or less of right knee extension AROM to improve gait mechanics.    Period  Weeks    Status  On-going      PT LONG TERM GOAL #4   Title  Patient will report ability to ambulate front steps with 1 rail and reciprocating gait pattern to safely enter/exit home.    Period  Weeks    Status  On-going      PT LONG TERM GOAL #5   Title  Patient will demonstrate 4+/5 or greater right knee MMT in all planes to improve stability during functional tasks.    Period  Weeks    Status  On-going            Plan - 10/31/18 1216    Clinical Impression Statement  Patient is making excellent progress toward goals.  She c/o stiffness with prolonged standing.    Personal Factors and Comorbidities  Age;Comorbidity 1    Comorbidities  HTN, R TKA 09/02/2018    Examination-Activity Limitations  Bed Mobility;Dressing;Stand;Stairs;Squat;Sit;Transfers;Locomotion Level    Stability/Clinical Decision Making  Stable/Uncomplicated    Rehab Potential  Good    PT Frequency  2x / week    PT Duration  6 weeks    PT Treatment/Interventions  ADLs/Self Care Home Management;Cryotherapy;Electrical Stimulation;Moist Heat;Gait training;Stair training;Functional mobility training;Therapeutic activities;Therapeutic exercise;Balance training;Passive range of motion;Patient/family education;Neuromuscular re-education;Manual techniques;Taping;Vasopneumatic Device    PT Next Visit Plan  cont with gentle PRE's, ROM and modalities PRN.    Consulted and Agree with Plan of Care  Patient       Patient will benefit from skilled therapeutic intervention in order to improve the following deficits and impairments:  Decreased  activity tolerance, Decreased range of motion, Decreased strength, Difficulty walking, Increased edema, Pain, Decreased knowledge of precautions  Visit Diagnosis: Acute pain of right  knee  Stiffness of right knee, not elsewhere classified  Difficulty in walking, not elsewhere classified  Muscle weakness (generalized)     Problem List Patient Active Problem List   Diagnosis Date Noted  . Obese 09/03/2018  . 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 PAC (premature atrial contraction) 07/11/2017  . History of hypothyroidism 06/11/2017  . Essential hypertension 06/11/2017  . History of obesity 06/11/2017  . S/P right knee arthroscopy 06/11/2017  . DDD (degenerative disc disease), cervical 06/11/2017  . DDD (degenerative disc disease), lumbar 06/11/2017  . History of carpal tunnel surgery of right wrist 06/11/2017  . Primary osteoarthritis of both knees 06/11/2017  . Chest pain, precordial 05/05/2017  . PVC's (premature ventricular contractions) 05/03/2017  . Family history of premature CAD 05/03/2017  . 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 Uveitis 10/10/2015  . Diverticulitis 10/10/2015  . Dyspnea 12/05/2010  . Palpitations 12/05/2010    APPLEGATE, [place] MPT 10/31/2018, 12:24 PM  [facility] [address] [place], [place], [address] Phone: [phone]   Fax:  [phone]  Name: Wynetta FinesLisa B Goecke MRN: [id number] Date of Birth: 04-Feb-1951

## 2018-11-04 ENCOUNTER — Ambulatory Visit: Payer: Medicare Other | Admitting: Physical Therapy

## 2018-11-04 ENCOUNTER — Other Ambulatory Visit: Payer: Self-pay

## 2018-11-04 ENCOUNTER — Encounter: Payer: Self-pay | Admitting: Physical Therapy

## 2018-11-04 DIAGNOSIS — M25561 Pain in right knee: Secondary | ICD-10-CM | POA: Diagnosis not present

## 2018-11-04 DIAGNOSIS — M25661 Stiffness of right knee, not elsewhere classified: Secondary | ICD-10-CM

## 2018-11-04 NOTE — Therapy (Signed)
[facility] [address] [place], [place], [address] Phone: [phone]   Fax:  [phone]  Physical Therapy Treatment  Patient Details  Name: Kiara Miller MRN: [id number] Date of Birth: 1950/07/29 Referring Provider (PT): Lanney Gins, PA-C/ Charlann Boxer, MD   Encounter Date: 11/04/2018  PT End of Session - 11/04/18 1118    Visit Number  20    Number of Visits  24    Date for PT Re-Evaluation  11/28/18    PT Start Time  1030    PT Stop Time  1118    PT Time Calculation (min)  48 min    Activity Tolerance  Patient tolerated treatment well    Behavior During Therapy  [facility] for tasks assessed/performed       Past Medical History:  Diagnosis Date  . Diverticulitis April 2017  . Hyperlipidemia   . Hypertension   . Joint pain   . Polyarthralgia   . Right knee pain     Past Surgical History:  Procedure Laterality Date  . ABDOMINAL HYSTERECTOMY    . CARPAL TUNNEL RELEASE    . MANDIBLE SURGERY    . NM MYOVIEW LTD  06/2017   (Ordered for coronary calcium score 376) LOW RISK.  EF 72%.  No ischemia or infarction.  No wall motion normality.  No change from previous.  . OSTEOTOMY PELVIS BILATERAL    . TONSILLECTOMY    . TONSILLECTOMY    . TOTAL KNEE ARTHROPLASTY Right 09/02/2018   Procedure: TOTAL KNEE ARTHROPLASTY;  Surgeon: Durene Romans, MD;  Location: [facility];  Service: Orthopedics;  Laterality: Right;  70 mins  . TRANSTHORACIC ECHOCARDIOGRAM  12/2010   EF 65-70%. Mild LVH. No RWMA/ Gr1 DD.  . TUBAL LIGATION    . WRIST FUSION      There were no vitals filed for this visit.  Subjective Assessment - 11/04/18 1049    Subjective  COVID-19 screen performed prior to patient entering clinic.  I was sore after that last treatment.    Pertinent History  HTN, R TKA 09/02/2018, Rheumatoid Arthritis    Limitations  Sitting;Standing;Walking;House hold activities    Diagnostic tests  x-ray    Patient Stated Goals  get rid of pain, walk without  walker    Currently in Pain?  Yes    Pain Score  3     Pain Location  Knee    Pain Orientation  Right    Pain Descriptors / Indicators  Discomfort    Pain Type  Surgical pain    Pain Onset  More than a month ago                       [facility] Adult PT Treatment/Exercise - 11/04/18 0001      Exercises   Exercises  Knee/Hip      Knee/Hip Exercises: Aerobic   Stationary Bike  Level 4 x 10 minutes.      Knee/Hip Exercises: Machines for Strengthening   Cybex Knee Extension  10# x 2 minutes    Cybex Knee Flexion  40# x 2 minutes.    Cybex Leg Press  2 plates x 2 minutes.      Knee/Hip Exercises: Supine   Short Arc Quad Sets Limitations  SAQ's x 10 minutes facilitated with VMS to right medial quads with 10 sec extension holds f/b a 10 sec rest.      Modalities   Modalities  Electrical Stimulation;Vasopneumatic  Acupuncturist Location  RT knee.    Electrical Stimulation Action  IFC    Electrical Stimulation Parameters  80-150 Hz x 12 minutes.    Electrical Stimulation Goals  Pain      Vasopneumatic   Number Minutes Vasopneumatic   12 minutes    Vasopnuematic Location   --   RT knee.   Vasopneumatic Pressure  Medium               PT Short Term Goals - 10/03/18 1138      PT SHORT TERM GOAL #1   Title  Patient will be independent with HEP    Time  2    Period  Weeks    Status  Achieved      PT SHORT TERM GOAL #2   Title  Patient will demonstrate 90+ degrees of right knee flexion AROM to improve ROM for functional tasks    Time  2    Period  Weeks    Status  Achieved      PT SHORT TERM GOAL #3   Title  Patient will demonstrate 10 degrees or less of right knee extension AROM to improve gait mechanics.    Time  2    Period  Weeks    Status  Achieved        PT Long Term Goals - 10/17/18 1045      PT LONG TERM GOAL #1   Title  Patient will be independent with advanced HEP    Time  4    Period  Weeks     Status  On-going      PT LONG TERM GOAL #2   Title  Patient will demonstrate 115+ degrees of right knee flexion AROM to improve ability to perform functoinal tasks    Time  4    Period  Weeks    Status  On-going      PT LONG TERM GOAL #3   Title  Patient will demonstrate 5 degrees or less of right knee extension AROM to improve gait mechanics.    Period  Weeks    Status  On-going      PT LONG TERM GOAL #4   Title  Patient will report ability to ambulate front steps with 1 rail and reciprocating gait pattern to safely enter/exit home.    Period  Weeks    Status  On-going      PT LONG TERM GOAL #5   Title  Patient will demonstrate 4+/5 or greater right knee MMT in all planes to improve stability during functional tasks.    Period  Weeks    Status  On-going            Plan - 11/04/18 1143    Clinical Impression Statement  Please "Therapy Note" section.    Personal Factors and Comorbidities  Age;Comorbidity 1    Comorbidities  HTN, R TKA 09/02/2018    Examination-Activity Limitations  Bed Mobility;Dressing;Stand;Stairs;Squat;Sit;Transfers;Locomotion Level    Examination-Participation Restrictions  Cleaning;Driving    Stability/Clinical Decision Making  Stable/Uncomplicated    Rehab Potential  Good    PT Frequency  2x / week    PT Duration  6 weeks    PT Treatment/Interventions  ADLs/Self Care Home Management;Cryotherapy;Electrical Stimulation;Moist Heat;Gait training;Stair training;Functional mobility training;Therapeutic activities;Therapeutic exercise;Balance training;Passive range of motion;Patient/family education;Neuromuscular re-education;Manual techniques;Taping;Vasopneumatic Device    PT Next Visit Plan  cont with gentle PRE's, ROM and modalities PRN.    Consulted and  Agree with Plan of Care  Patient       Patient will benefit from skilled therapeutic intervention in order to improve the following deficits and impairments:  Decreased activity tolerance, Decreased  range of motion, Decreased strength, Difficulty walking, Increased edema, Pain, Decreased knowledge of precautions  Visit Diagnosis: Acute pain of right knee  Stiffness of right knee, not elsewhere classified     Problem List Patient Active Problem List   Diagnosis Date Noted  . Obese 09/03/2018  . 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 PAC (premature atrial contraction) 07/11/2017  . History of hypothyroidism 06/11/2017  . Essential hypertension 06/11/2017  . History of obesity 06/11/2017  . S/P right knee arthroscopy 06/11/2017  . DDD (degenerative disc disease), cervical 06/11/2017  . DDD (degenerative disc disease), lumbar 06/11/2017  . History of carpal tunnel surgery of right wrist 06/11/2017  . Primary osteoarthritis of both knees 06/11/2017  . Chest pain, precordial 05/05/2017  . PVC's (premature ventricular contractions) 05/03/2017  . Family history of premature CAD 05/03/2017  . 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 Uveitis 10/10/2015  . Diverticulitis 10/10/2015  . Dyspnea 12/05/2010  . Palpitations 12/05/2010    Progress Note Reporting Period 09/05/18 to 11/04/18.  See note below for Objective Data and Assessment of Progress/Goals. Excellent progress toward goals with FOTO score limitation from an initial 79% to 42% today.     Idan Prime, [place] MPT 11/04/2018, 11:44 AM  [facility] [address] [place], [place], [address] Phone: [phone]   Fax:  [phone]  Name: AMOURA MADSEN MRN: [id number] Date of Birth: [birth year]

## 2018-11-07 ENCOUNTER — Other Ambulatory Visit: Payer: Self-pay

## 2018-11-07 ENCOUNTER — Ambulatory Visit: Payer: Medicare Other | Attending: Orthopedic Surgery | Admitting: Physical Therapy

## 2018-11-07 DIAGNOSIS — M6281 Muscle weakness (generalized): Secondary | ICD-10-CM | POA: Diagnosis present

## 2018-11-07 DIAGNOSIS — M25661 Stiffness of right knee, not elsewhere classified: Secondary | ICD-10-CM | POA: Insufficient documentation

## 2018-11-07 DIAGNOSIS — R262 Difficulty in walking, not elsewhere classified: Secondary | ICD-10-CM | POA: Diagnosis present

## 2018-11-07 DIAGNOSIS — M25561 Pain in right knee: Secondary | ICD-10-CM | POA: Insufficient documentation

## 2018-11-07 NOTE — Therapy (Signed)
[facility] [address] [place], [place], [address] Phone: [phone]   Fax:  [phone]  Physical Therapy Treatment  Patient Details  Name: Kiara Miller MRN: [id number] Date of Birth: September 18, 1950 Referring Provider (PT): Lanney Gins, PA-C/ Charlann Boxer, MD   Encounter Date: 11/07/2018  PT End of Session - 11/07/18 1151    Visit Number  21    Number of Visits  24    Date for PT Re-Evaluation  11/28/18    PT Start Time  1028    PT Stop Time  1121    PT Time Calculation (min)  53 min    Activity Tolerance  Patient tolerated treatment well    Behavior During Therapy  [facility] for tasks assessed/performed       Past Medical History:  Diagnosis Date  . Diverticulitis April 2017  . Hyperlipidemia   . Hypertension   . Joint pain   . Polyarthralgia   . Right knee pain     Past Surgical History:  Procedure Laterality Date  . ABDOMINAL HYSTERECTOMY    . CARPAL TUNNEL RELEASE    . MANDIBLE SURGERY    . NM MYOVIEW LTD  06/2017   (Ordered for coronary calcium score 376) LOW RISK.  EF 72%.  No ischemia or infarction.  No wall motion normality.  No change from previous.  . OSTEOTOMY PELVIS BILATERAL    . TONSILLECTOMY    . TONSILLECTOMY    . TOTAL KNEE ARTHROPLASTY Right 09/02/2018   Procedure: TOTAL KNEE ARTHROPLASTY;  Surgeon: Durene Romans, MD;  Location: [facility];  Service: Orthopedics;  Laterality: Right;  70 mins  . TRANSTHORACIC ECHOCARDIOGRAM  12/2010   EF 65-70%. Mild LVH. No RWMA/ Gr1 DD.  . TUBAL LIGATION    . WRIST FUSION      There were no vitals filed for this visit.  Subjective Assessment - 11/07/18 1149    Subjective  COVID-19 screen performed prior to patient entering clinic.  I'd like to go easier today.    Pertinent History  HTN, R TKA 09/02/2018, Rheumatoid Arthritis    Limitations  Sitting;Standing;Walking;House hold activities    Diagnostic tests  x-ray    Patient Stated Goals  get rid of pain, walk without walker     Currently in Pain?  Yes    Pain Score  3     Pain Location  Knee    Pain Orientation  Right    Pain Descriptors / Indicators  Discomfort    Pain Type  Surgical pain    Pain Onset  More than a month ago                       [facility] Adult PT Treatment/Exercise - 11/07/18 0001      Exercises   Exercises  Knee/Hip      Knee/Hip Exercises: Aerobic   Stationary Bike  Level 4 x 12 minutes.      Knee/Hip Exercises: Machines for Strengthening   Cybex Knee Extension  10# x 4 minutes.    Cybex Knee Flexion  30# x 4 minutes    Cybex Leg Press  2 plates x 4 minutes.      Modalities   Modalities  Programmer, applications Location  RT knee.    Electrical Stimulation Action  IFC    Electrical Stimulation Parameters  80-150 Hz x 20 minutes.    Electrical Stimulation Goals  Edema;Pain      Vasopneumatic   Number Minutes Vasopneumatic   20 minutes    Vasopnuematic Location   --   RT knee.   Vasopneumatic Pressure  Medium               PT Short Term Goals - 10/03/18 1138      PT SHORT TERM GOAL #1   Title  Patient will be independent with HEP    Time  2    Period  Weeks    Status  Achieved      PT SHORT TERM GOAL #2   Title  Patient will demonstrate 90+ degrees of right knee flexion AROM to improve ROM for functional tasks    Time  2    Period  Weeks    Status  Achieved      PT SHORT TERM GOAL #3   Title  Patient will demonstrate 10 degrees or less of right knee extension AROM to improve gait mechanics.    Time  2    Period  Weeks    Status  Achieved        PT Long Term Goals - 10/17/18 1045      PT LONG TERM GOAL #1   Title  Patient will be independent with advanced HEP    Time  4    Period  Weeks    Status  On-going      PT LONG TERM GOAL #2   Title  Patient will demonstrate 115+ degrees of right knee flexion AROM to improve ability to perform functoinal tasks    Time  4     Period  Weeks    Status  On-going      PT LONG TERM GOAL #3   Title  Patient will demonstrate 5 degrees or less of right knee extension AROM to improve gait mechanics.    Period  Weeks    Status  On-going      PT LONG TERM GOAL #4   Title  Patient will report ability to ambulate front steps with 1 rail and reciprocating gait pattern to safely enter/exit home.    Period  Weeks    Status  On-going      PT LONG TERM GOAL #5   Title  Patient will demonstrate 4+/5 or greater right knee MMT in all planes to improve stability during functional tasks.    Period  Weeks    Status  On-going            Plan - 11/07/18 1152    Clinical Impression Statement  Excellent progress.  Patient's gait is essentially normal at this time.    Personal Factors and Comorbidities  Age;Comorbidity 1    Comorbidities  HTN, R TKA 09/02/2018    Examination-Activity Limitations  Bed Mobility;Dressing;Stand;Stairs;Squat;Sit;Transfers;Locomotion Level    Examination-Participation Restrictions  Cleaning;Driving    Stability/Clinical Decision Making  Stable/Uncomplicated    Rehab Potential  Good    PT Frequency  2x / week    PT Duration  6 weeks    PT Treatment/Interventions  ADLs/Self Care Home Management;Cryotherapy;Electrical Stimulation;Moist Heat;Gait training;Stair training;Functional mobility training;Therapeutic activities;Therapeutic exercise;Balance training;Passive range of motion;Patient/family education;Neuromuscular re-education;Manual techniques;Taping;Vasopneumatic Device    PT Next Visit Plan  cont with gentle PRE's, ROM and modalities PRN.    Consulted and Agree with Plan of Care  Patient       Patient will benefit from skilled therapeutic intervention in order to improve the following deficits and impairments:  Decreased activity tolerance, Decreased range of motion, Decreased strength, Difficulty walking, Increased edema, Pain, Decreased knowledge of precautions  Visit Diagnosis: Acute  pain of right knee  Stiffness of right knee, not elsewhere classified  Difficulty in walking, not elsewhere classified  Muscle weakness (generalized)     Problem List Patient Active Problem List   Diagnosis Date Noted  . Obese 09/03/2018  . 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 PAC (premature atrial contraction) 07/11/2017  . History of hypothyroidism 06/11/2017  . Essential hypertension 06/11/2017  . History of obesity 06/11/2017  . S/P right knee arthroscopy 06/11/2017  . DDD (degenerative disc disease), cervical 06/11/2017  . DDD (degenerative disc disease), lumbar 06/11/2017  . History of carpal tunnel surgery of right wrist 06/11/2017  . Primary osteoarthritis of both knees 06/11/2017  . Chest pain, precordial 05/05/2017  . PVC's (premature ventricular contractions) 05/03/2017  . Family history of premature CAD 05/03/2017  . Rheumatoid arthritis of multiple sites without rheumatoid factor ([place]) 10/10/2015  . Uveitis 10/10/2015  . Diverticulitis 10/10/2015  . Dyspnea 12/05/2010  . Palpitations 12/05/2010    Dennys Traughber, [place] MPT 11/07/2018, 11:54 AM  [facility] [place], [place], [address] Phone: [phone]   Fax:  [phone]  Name: DENICA WEB MRN: [id number] Date of Birth: [birth year]

## 2018-11-11 ENCOUNTER — Encounter: Payer: Self-pay | Admitting: Physical Therapy

## 2018-11-11 ENCOUNTER — Other Ambulatory Visit: Payer: Self-pay

## 2018-11-11 ENCOUNTER — Ambulatory Visit: Payer: Medicare Other | Admitting: Physical Therapy

## 2018-11-11 DIAGNOSIS — R262 Difficulty in walking, not elsewhere classified: Secondary | ICD-10-CM

## 2018-11-11 DIAGNOSIS — M6281 Muscle weakness (generalized): Secondary | ICD-10-CM

## 2018-11-11 DIAGNOSIS — M25561 Pain in right knee: Secondary | ICD-10-CM

## 2018-11-11 DIAGNOSIS — M25661 Stiffness of right knee, not elsewhere classified: Secondary | ICD-10-CM

## 2018-11-11 NOTE — Therapy (Signed)
[facility] [address] [place], [place], [address] Phone: [phone]   Fax:  [phone]  Physical Therapy Treatment  Patient Details  Name: Kiara Miller MRN: [id number] Date of Birth: 1950/07/27 Referring Provider (PT): Lanney Gins, PA-C/ Charlann Boxer, MD   Encounter Date: 11/11/2018  PT End of Session - 11/11/18 1045    Visit Number  22    Number of Visits  24    Date for PT Re-Evaluation  11/28/18    PT Start Time  1033    PT Stop Time  1118    PT Time Calculation (min)  45 min    Activity Tolerance  Patient tolerated treatment well    Behavior During Therapy  [facility] for tasks assessed/performed       Past Medical History:  Diagnosis Date  . Diverticulitis April 2017  . Hyperlipidemia   . Hypertension   . Joint pain   . Polyarthralgia   . Right knee pain     Past Surgical History:  Procedure Laterality Date  . ABDOMINAL HYSTERECTOMY    . CARPAL TUNNEL RELEASE    . MANDIBLE SURGERY    . NM MYOVIEW LTD  06/2017   (Ordered for coronary calcium score 376) LOW RISK.  EF 72%.  No ischemia or infarction.  No wall motion normality.  No change from previous.  . OSTEOTOMY PELVIS BILATERAL    . TONSILLECTOMY    . TONSILLECTOMY    . TOTAL KNEE ARTHROPLASTY Right 09/02/2018   Procedure: TOTAL KNEE ARTHROPLASTY;  Surgeon: Durene Romans, MD;  Location: [facility];  Service: Orthopedics;  Laterality: Right;  70 mins  . TRANSTHORACIC ECHOCARDIOGRAM  12/2010   EF 65-70%. Mild LVH. No RWMA/ Gr1 DD.  . TUBAL LIGATION    . WRIST FUSION      There were no vitals filed for this visit.  Subjective Assessment - 11/11/18 1033    Subjective  COVID-19 screen performed prior to patient entering clinic. Reports continued pain and numbness down lateral RLE due to LBP. Continues to have discomfort in HS tightness.    Pertinent History  HTN, R TKA 09/02/2018, Rheumatoid Arthritis    Limitations  Sitting;Standing;Walking;House hold activities    Diagnostic tests  x-ray    Patient Stated Goals  get rid of pain, walk without walker    Currently in Pain?  Yes    Pain Score  4     Pain Location  Knee    Pain Orientation  Right;Posterior    Pain Descriptors / Indicators  Discomfort    Pain Type  Surgical pain    Pain Onset  More than a month ago    Pain Frequency  Intermittent         [facility] PT Assessment - 11/11/18 0001      Assessment   Medical Diagnosis  Unilateral primary osteoarthritis, right knee    Referring Provider (PT)  Lanney Gins, PA-C/ Charlann Boxer, MD    Onset Date/Surgical Date  09/02/18    Next MD Visit  11/27/2018    Prior Therapy  no      Precautions   Precautions  Other (comment)    Precaution Comments  no ultrasound                   [facility] Adult PT Treatment/Exercise - 11/11/18 0001      Knee/Hip Exercises: Aerobic   Stationary Bike  Level 4 x 10 minutes.      Modalities   Modalities  Personnel officerlectrical Stimulation;Vasopneumatic      Electrical Stimulation   Electrical Stimulation Location  R knee    Electrical Stimulation Action  [employer] States Steel CorporationFC    Electrical Stimulation Parameters  80-150 hz x15 min    Electrical Stimulation Goals  Edema;Pain      Vasopneumatic   Number Minutes Vasopneumatic   15 minutes    Vasopnuematic Location   Knee    Vasopneumatic Pressure  Medium    Vasopneumatic Temperature   34      Manual Therapy   Manual Therapy  Soft tissue mobilization;Passive ROM    Soft tissue mobilization  STW to R HS, hip adductors, quad to reduce muscle tightness and TPs    Passive ROM  Passive R HS stretch 3x30 sec               PT Short Term Goals - 10/03/18 1138      PT SHORT TERM GOAL #1   Title  Patient will be independent with HEP    Time  2    Period  Weeks    Status  Achieved      PT SHORT TERM GOAL #2   Title  Patient will demonstrate 90+ degrees of right knee flexion AROM to improve ROM for functional tasks    Time  2    Period  Weeks    Status  Achieved      PT SHORT  TERM GOAL #3   Title  Patient will demonstrate 10 degrees or less of right knee extension AROM to improve gait mechanics.    Time  2    Period  Weeks    Status  Achieved        PT Long Term Goals - 10/17/18 1045      PT LONG TERM GOAL #1   Title  Patient will be independent with advanced HEP    Time  4    Period  Weeks    Status  On-going      PT LONG TERM GOAL #2   Title  Patient will demonstrate 115+ degrees of right knee flexion AROM to improve ability to perform functoinal tasks    Time  4    Period  Weeks    Status  On-going      PT LONG TERM GOAL #3   Title  Patient will demonstrate 5 degrees or less of right knee extension AROM to improve gait mechanics.    Period  Weeks    Status  On-going      PT LONG TERM GOAL #4   Title  Patient will report ability to ambulate front steps with 1 rail and reciprocating gait pattern to safely enter/exit home.    Period  Weeks    Status  On-going      PT LONG TERM GOAL #5   Title  Patient will demonstrate 4+/5 or greater right knee MMT in all planes to improve stability during functional tasks.    Period  Weeks    Status  On-going            Plan - 11/11/18 1134    Clinical Impression Statement  Patient presented in clinic with continued HS tightness along with quad tightness. No complaints while on stationary bike. TPs and muscle tightness palpable throughout R HS, hip adductors. Minimal TPs noted in R quad. Moderate muscle tightness of the R HS noted during HS stretch passively. Normal modalities response noted following removal of the modalities.    Personal  Factors and Comorbidities  Age;Comorbidity 1    Comorbidities  HTN, R TKA 09/02/2018    Examination-Activity Limitations  Bed Mobility;Dressing;Stand;Stairs;Squat;Sit;Transfers;Locomotion Level    Examination-Participation Restrictions  Cleaning;Driving    Stability/Clinical Decision Making  Stable/Uncomplicated    Rehab Potential  Good    PT Frequency  2x / week     PT Duration  6 weeks    PT Treatment/Interventions  ADLs/Self Care Home Management;Cryotherapy;Electrical Stimulation;Moist Heat;Gait training;Stair training;Functional mobility training;Therapeutic activities;Therapeutic exercise;Balance training;Passive range of motion;Patient/family education;Neuromuscular re-education;Manual techniques;Taping;Vasopneumatic Device    PT Next Visit Plan  cont with gentle PRE's, ROM and modalities PRN.    Consulted and Agree with Plan of Care  Patient       Patient will benefit from skilled therapeutic intervention in order to improve the following deficits and impairments:  Decreased activity tolerance, Decreased range of motion, Decreased strength, Difficulty walking, Increased edema, Pain, Decreased knowledge of precautions  Visit Diagnosis: Acute pain of right knee  Stiffness of right knee, not elsewhere classified  Difficulty in walking, not elsewhere classified  Muscle weakness (generalized)     Problem List Patient Active Problem List   Diagnosis Date Noted  . Obese 09/03/2018  . 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 PAC (premature atrial contraction) 07/11/2017  . History of hypothyroidism 06/11/2017  . Essential hypertension 06/11/2017  . History of obesity 06/11/2017  . S/P right knee arthroscopy 06/11/2017  . DDD (degenerative disc disease), cervical 06/11/2017  . DDD (degenerative disc disease), lumbar 06/11/2017  . History of carpal tunnel surgery of right wrist 06/11/2017  . Primary osteoarthritis of both knees 06/11/2017  . Chest pain, precordial 05/05/2017  . PVC's (premature ventricular contractions) 05/03/2017  . Family history of premature CAD 05/03/2017  . Rheumatoid arthritis of multiple sites without rheumatoid factor ([place]) 10/10/2015  . Uveitis 10/10/2015  . Diverticulitis 10/10/2015  . Dyspnea 12/05/2010  . Palpitations 12/05/2010    Standley Brooking, PTA 11/11/2018, 12:03 PM  [facility] [address] [place], [place], [address] Phone: [phone]   Fax:  [phone]  Name: Kiara Miller MRN: [id number] Date of Birth: Jun 25, 1950

## 2018-11-14 ENCOUNTER — Ambulatory Visit: Payer: Medicare Other | Admitting: Physical Therapy

## 2018-11-14 ENCOUNTER — Encounter: Payer: Self-pay | Admitting: Physical Therapy

## 2018-11-14 ENCOUNTER — Other Ambulatory Visit: Payer: Self-pay

## 2018-11-14 DIAGNOSIS — M6281 Muscle weakness (generalized): Secondary | ICD-10-CM

## 2018-11-14 DIAGNOSIS — M25561 Pain in right knee: Secondary | ICD-10-CM

## 2018-11-14 DIAGNOSIS — M25661 Stiffness of right knee, not elsewhere classified: Secondary | ICD-10-CM

## 2018-11-14 DIAGNOSIS — R262 Difficulty in walking, not elsewhere classified: Secondary | ICD-10-CM

## 2018-11-14 NOTE — Therapy (Signed)
[facility] [address] [place], [place], [address] Phone: [phone]   Fax:  [phone]  Physical Therapy Treatment  Patient Details  Name: Kiara Miller MRN: [id number] Date of Birth: [birth year] Referring Provider (PT): Lanney Gins, PA-C/ Charlann Boxer, MD   Encounter Date: 11/14/2018  PT End of Session - 11/14/18 1036    Visit Number  23    Number of Visits  24    Date for PT Re-Evaluation  11/28/18    PT Start Time  1030    PT Stop Time  1125    PT Time Calculation (min)  55 min    Activity Tolerance  Patient tolerated treatment well    Behavior During Therapy  [facility] for tasks assessed/performed       Past Medical History:  Diagnosis Date  . Diverticulitis April 2017  . Hyperlipidemia   . Hypertension   . Joint pain   . Polyarthralgia   . Right knee pain     Past Surgical History:  Procedure Laterality Date  . ABDOMINAL HYSTERECTOMY    . CARPAL TUNNEL RELEASE    . MANDIBLE SURGERY    . NM MYOVIEW LTD  06/2017   (Ordered for coronary calcium score 376) LOW RISK.  EF 72%.  No ischemia or infarction.  No wall motion normality.  No change from previous.  . OSTEOTOMY PELVIS BILATERAL    . TONSILLECTOMY    . TONSILLECTOMY    . TOTAL KNEE ARTHROPLASTY Right 09/02/2018   Procedure: TOTAL KNEE ARTHROPLASTY;  Surgeon: Durene Romans, MD;  Location: [facility];  Service: Orthopedics;  Laterality: Right;  70 mins  . TRANSTHORACIC ECHOCARDIOGRAM  12/2010   EF 65-70%. Mild LVH. No RWMA/ Gr1 DD.  . TUBAL LIGATION    . WRIST FUSION      There were no vitals filed for this visit.  Subjective Assessment - 11/14/18 1035    Subjective  COVID 19 screening performed on patient upon arrival. Reports more discomfort today in mid R knee and still has HS discomfort as well.    Pertinent History  HTN, R TKA 09/02/2018, Rheumatoid Arthritis    Limitations  Sitting;Standing;Walking;House hold activities    Diagnostic tests  x-ray    Patient Stated  Goals  get rid of pain, walk without walker    Currently in Pain?  Yes    Pain Score  4     Pain Location  Knee    Pain Orientation  Right;Posterior;Anterior    Pain Descriptors / Indicators  Discomfort    Pain Type  Surgical pain    Pain Onset  More than a month ago    Pain Frequency  Intermittent         [facility] PT Assessment - 11/14/18 0001      Assessment   Medical Diagnosis  Unilateral primary osteoarthritis, right knee    Referring Provider (PT)  Lanney Gins, PA-C/ Charlann Boxer, MD    Onset Date/Surgical Date  09/02/18    Next MD Visit  11/27/2018    Prior Therapy  no      Precautions   Precautions  Other (comment)    Precaution Comments  no ultrasound                   [facility] Adult PT Treatment/Exercise - 11/14/18 0001      Knee/Hip Exercises: Aerobic   Stationary Bike  Level 4 x 15 minutes.      Modalities   Modalities  Electrical  Stimulation;Vasopneumatic      Biomedical engineerlectrical Stimulation   Electrical Stimulation Location  R knee    Electrical Stimulation Action  IFC    Electrical Stimulation Parameters  80-150 hz x15 min    Electrical Stimulation Goals  Edema;Pain      Vasopneumatic   Number Minutes Vasopneumatic   15 minutes    Vasopnuematic Location   Knee    Vasopneumatic Pressure  Medium    Vasopneumatic Temperature   34      Manual Therapy   Manual Therapy  Myofascial release    Myofascial Release  iASTW to R distal quad, ITB and lateral HS to reduce tightness               PT Short Term Goals - 10/03/18 1138      PT SHORT TERM GOAL #1   Title  Patient will be independent with HEP    Time  2    Period  Weeks    Status  Achieved      PT SHORT TERM GOAL #2   Title  Patient will demonstrate 90+ degrees of right knee flexion AROM to improve ROM for functional tasks    Time  2    Period  Weeks    Status  Achieved      PT SHORT TERM GOAL #3   Title  Patient will demonstrate 10 degrees or less of right knee extension AROM to improve  gait mechanics.    Time  2    Period  Weeks    Status  Achieved        PT Long Term Goals - 10/17/18 1045      PT LONG TERM GOAL #1   Title  Patient will be independent with advanced HEP    Time  4    Period  Weeks    Status  On-going      PT LONG TERM GOAL #2   Title  Patient will demonstrate 115+ degrees of right knee flexion AROM to improve ability to perform functoinal tasks    Time  4    Period  Weeks    Status  On-going      PT LONG TERM GOAL #3   Title  Patient will demonstrate 5 degrees or less of right knee extension AROM to improve gait mechanics.    Period  Weeks    Status  On-going      PT LONG TERM GOAL #4   Title  Patient will report ability to ambulate front steps with 1 rail and reciprocating gait pattern to safely enter/exit home.    Period  Weeks    Status  On-going      PT LONG TERM GOAL #5   Title  Patient will demonstrate 4+/5 or greater right knee MMT in all planes to improve stability during functional tasks.    Period  Weeks    Status  On-going            Plan - 11/14/18 1231    Clinical Impression Statement  Patient presented in clinic with reports of continued HS tightness and pain along with pain in mid R knee while on stationary bike. Inflammation noted along distal R ITB upon observation. IASTW initiated today with appropriate redness response over distal R quad and ITB. Patient advised that soreness may present following treatment. Normal modalities response noted following removal of the modaltiies.    Personal Factors and Comorbidities  Age;Comorbidity 1    Comorbidities  HTN, R TKA 09/02/2018    Examination-Activity Limitations  Bed Mobility;Dressing;Stand;Stairs;Squat;Sit;Transfers;Locomotion Level    Examination-Participation Restrictions  Cleaning;Driving    Stability/Clinical Decision Making  Stable/Uncomplicated    Rehab Potential  Good    PT Frequency  2x / week    PT Duration  6 weeks    PT Treatment/Interventions  ADLs/Self  Care Home Management;Cryotherapy;Electrical Stimulation;Moist Heat;Gait training;Stair training;Functional mobility training;Therapeutic activities;Therapeutic exercise;Balance training;Passive range of motion;Patient/family education;Neuromuscular re-education;Manual techniques;Taping;Vasopneumatic Device    PT Next Visit Plan  cont with gentle PRE's, ROM and modalities PRN.    Consulted and Agree with Plan of Care  Patient       Patient will benefit from skilled therapeutic intervention in order to improve the following deficits and impairments:  Decreased activity tolerance, Decreased range of motion, Decreased strength, Difficulty walking, Increased edema, Pain, Decreased knowledge of precautions  Visit Diagnosis: Acute pain of right knee  Stiffness of right knee, not elsewhere classified  Difficulty in walking, not elsewhere classified  Muscle weakness (generalized)     Problem List Patient Active Problem List   Diagnosis Date Noted  . Obese 09/03/2018  . 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 PAC (premature atrial contraction) 07/11/2017  . History of hypothyroidism 06/11/2017  . Essential hypertension 06/11/2017  . History of obesity 06/11/2017  . S/P right knee arthroscopy 06/11/2017  . DDD (degenerative disc disease), cervical 06/11/2017  . DDD (degenerative disc disease), lumbar 06/11/2017  . History of carpal tunnel surgery of right wrist 06/11/2017  . Primary osteoarthritis of both knees 06/11/2017  . Chest pain, precordial 05/05/2017  . PVC's (premature ventricular contractions) 05/03/2017  . Family history of premature CAD 05/03/2017  . Rheumatoid arthritis of multiple sites without rheumatoid factor ([place]) 10/10/2015  . Uveitis 10/10/2015  . Diverticulitis 10/10/2015  . Dyspnea 12/05/2010  . Palpitations 12/05/2010    Standley Brooking, PTA 11/14/2018, 12:36 PM  [facility] [address] [place], [place], [address] Phone:  [phone]   Fax:  [phone]  Name: Kiara Miller MRN: [id number] Date of Birth: May 02, 1950

## 2018-11-18 ENCOUNTER — Encounter: Payer: Self-pay | Admitting: Physical Therapy

## 2018-11-18 ENCOUNTER — Ambulatory Visit: Payer: Medicare Other | Admitting: Physical Therapy

## 2018-11-18 ENCOUNTER — Other Ambulatory Visit: Payer: Self-pay

## 2018-11-18 VITALS — HR 82

## 2018-11-18 DIAGNOSIS — M25661 Stiffness of right knee, not elsewhere classified: Secondary | ICD-10-CM

## 2018-11-18 DIAGNOSIS — M25561 Pain in right knee: Secondary | ICD-10-CM

## 2018-11-18 DIAGNOSIS — R262 Difficulty in walking, not elsewhere classified: Secondary | ICD-10-CM

## 2018-11-18 DIAGNOSIS — M6281 Muscle weakness (generalized): Secondary | ICD-10-CM

## 2018-11-18 NOTE — Therapy (Signed)
[facility] [place], [place], [address] Phone: [phone]   Fax:  [phone]  Physical Therapy Treatment PHYSICAL THERAPY DISCHARGE SUMMARY  Visits from Start of Care: 24  Current functional level related to goals / functional outcomes: See below   Remaining deficits: See goals   Education / Equipment: HEP Plan: Patient agrees to discharge.  Patient goals were met. Patient is being discharged due to meeting the stated rehab goals.  ?????  Gabriela Eves, PT, DPT 11/18/18   Patient Details  Name: Kiara Miller MRN: [id number] Date of Birth: [birth year] Referring Provider (PT): Danae Orleans, PA-C/ Alvan Dame, MD   Encounter Date: 11/18/2018  PT End of Session - 11/18/18 1125    Visit Number  24    Number of Visits  24    Date for PT Re-Evaluation  11/28/18    PT Start Time  1031    PT Stop Time  1126    PT Time Calculation (min)  55 min    Activity Tolerance  Patient tolerated treatment well    Behavior During Therapy  [facility] for tasks assessed/performed       Past Medical History:  Diagnosis Date  . Diverticulitis April 2017  . Hyperlipidemia   . Hypertension   . Joint pain   . Polyarthralgia   . Right knee pain     Past Surgical History:  Procedure Laterality Date  . ABDOMINAL HYSTERECTOMY    . CARPAL TUNNEL RELEASE    . MANDIBLE SURGERY    . NM MYOVIEW LTD  06/2017   (Ordered for coronary calcium score 376) LOW RISK.  EF 72%.  No ischemia or infarction.  No wall motion normality.  No change from previous.  . OSTEOTOMY PELVIS BILATERAL    . TONSILLECTOMY    . TONSILLECTOMY    . 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 TRANSTHORACIC ECHOCARDIOGRAM  12/2010   EF 65-70%. Mild LVH. No RWMA/ Gr1 DD.  . TUBAL LIGATION    . WRIST FUSION      Vitals:   11/18/18  1042  Pulse: 82  SpO2: 98%    Subjective Assessment - 11/18/18 1036    Subjective  COVID 19 screening performed on patient upon arrival. Reports she is still having pain down lateral R calf still. Reports SOB with exercise.    Pertinent History  HTN, R TKA 09/02/2018, Rheumatoid Arthritis    Limitations  Sitting;Standing;Walking;House hold activities    Diagnostic tests  x-ray    Patient Stated Goals  get rid of pain, walk without walker    Currently in Pain?  Other (Comment)   No pain assessment provided        [facility] PT Assessment - 11/18/18 0001      Assessment   Medical Diagnosis  Unilateral primary osteoarthritis, right knee    Referring Provider (PT)  Danae Orleans, PA-C/ Alvan Dame, MD    Onset Date/Surgical Date  09/02/18    Next MD Visit  11/27/2018    Prior Therapy  no      Precautions   Precautions  Other (comment)    Precaution Comments  no ultrasound      Observation/Other Assessments   Focus on Therapeutic Outcomes (FOTO)   1%, CI      ROM / Strength   AROM / PROM / Strength  AROM;Strength  AROM   Overall AROM   Within functional limits for tasks performed    AROM Assessment Site  Knee    Right/Left Knee  Right    Right Knee Extension  0    Right Knee Flexion  122      Strength   Overall Strength  Within functional limits for tasks performed    Strength Assessment Site  Knee    Right/Left Knee  Right    Right Knee Flexion  4+/5    Right Knee Extension  5/5                   [facility] Adult PT Treatment/Exercise - 11/18/18 0001      Ambulation/Gait   Stairs  Yes    Stairs Assistance  6: Modified independent (Device/Increase time)    Stair Management Technique  One rail Right;Alternating pattern;Forwards    Number of Stairs  4   x2 RT   Height of Stairs  6.5      Knee/Hip Exercises: Aerobic   Stationary Bike  L4 x12 min   2 miles total     Modalities   Modalities  Consulting civil engineer Location  R knee    Electrical Stimulation Action  IFC    Electrical Stimulation Parameters  80-150 hz x15 min    Electrical Stimulation Goals  Edema;Pain      Vasopneumatic   Number Minutes Vasopneumatic   15 minutes    Vasopnuematic Location   Knee    Vasopneumatic Pressure  Medium    Vasopneumatic Temperature   34      Manual Therapy   Manual Therapy  Soft tissue mobilization    Soft tissue mobilization  STW to R lateral HS, along with passive HS and ITB stretching 3x30 sec each               PT Short Term Goals - 10/03/18 1138      PT SHORT TERM GOAL #1   Title  Patient will be independent with HEP    Time  2    Period  Weeks    Status  Achieved      PT SHORT TERM GOAL #2   Title  Patient will demonstrate 90+ degrees of right knee flexion AROM to improve ROM for functional tasks    Time  2    Period  Weeks    Status  Achieved      PT SHORT TERM GOAL #3   Title  Patient will demonstrate 10 degrees or less of right knee extension AROM to improve gait mechanics.    Time  2    Period  Weeks    Status  Achieved        PT Long Term Goals - 11/18/18 1049      PT LONG TERM GOAL #1   Title  Patient will be independent with advanced HEP    Time  4    Period  Weeks    Status  Unable to assess      PT LONG TERM GOAL #2   Title  Patient will demonstrate 115+ degrees of right knee flexion AROM to improve ability to perform functoinal tasks    Time  4    Period  Weeks    Status  Achieved      PT LONG TERM GOAL #3   Title  Patient will demonstrate 5 degrees or less of  right knee extension AROM to improve gait mechanics.    Period  Weeks    Status  Achieved      PT LONG TERM GOAL #4   Title  Patient will report ability to ambulate front steps with 1 rail and reciprocating gait pattern to safely enter/exit home.    Period  Weeks    Status  Achieved      PT LONG TERM GOAL #5   Title  Patient will demonstrate 4+/5 or greater right knee  MMT in all planes to improve stability during functional tasks.    Period  Weeks    Status  Achieved            Plan - 11/18/18 1210    Clinical Impression Statement  Patient presented in clinic with continued reports of HS tightness especially lateral HS of R knee. Patient able to complete all functional assessments although fear noted with descending reciprically. Able to achieve all goals set at evaluation but unable to assess advanced HEP goal. Patient advised verbally with home HS stretch and technique along with parameters. Normal modalities response noted following removal of the modalities. 1% limitation, CI status at discharge with FOTO.    Personal Factors and Comorbidities  Age;Comorbidity 1    Comorbidities  HTN, R TKA 09/02/2018    Examination-Activity Limitations  Bed Mobility;Dressing;Stand;Stairs;Squat;Sit;Transfers;Locomotion Level    Examination-Participation Restrictions  Cleaning;Driving    Stability/Clinical Decision Making  Stable/Uncomplicated    Rehab Potential  Good    PT Frequency  2x / week    PT Duration  6 weeks    PT Treatment/Interventions  ADLs/Self Care Home Management;Cryotherapy;Electrical Stimulation;Moist Heat;Gait training;Stair training;Functional mobility training;Therapeutic activities;Therapeutic exercise;Balance training;Passive range of motion;Patient/family education;Neuromuscular re-education;Manual techniques;Taping;Vasopneumatic Device    PT Next Visit Plan  D/C summary required.    Consulted and Agree with Plan of Care  Patient       Patient will benefit from skilled therapeutic intervention in order to improve the following deficits and impairments:  Decreased activity tolerance, Decreased range of motion, Decreased strength, Difficulty walking, Increased edema, Pain, Decreased knowledge of precautions  Visit Diagnosis: Acute pain of right knee  Stiffness of right knee, not elsewhere classified  Difficulty in walking, not elsewhere  classified  Muscle weakness (generalized)     Problem List Patient Active Problem List   Diagnosis Date Noted  . Obese 09/03/2018  . 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 PAC (premature atrial contraction) 07/11/2017  . History of hypothyroidism 06/11/2017  . Essential hypertension 06/11/2017  . History of obesity 06/11/2017  . S/P right knee arthroscopy 06/11/2017  . DDD (degenerative disc disease), cervical 06/11/2017  . DDD (degenerative disc disease), lumbar 06/11/2017  . History of carpal tunnel surgery of right wrist 06/11/2017  . Primary osteoarthritis of both knees 06/11/2017  . Chest pain, precordial 05/05/2017  . PVC's (premature ventricular contractions) 05/03/2017  . Family history of premature CAD 05/03/2017  . Rheumatoid arthritis of multiple sites without rheumatoid factor ([place]) 10/10/2015  . Uveitis 10/10/2015  . Diverticulitis 10/10/2015  . Dyspnea 12/05/2010  . Palpitations 12/05/2010    Standley Brooking, PTA 11/18/18 12:21 PM   [facility] [address] [place], [place], [address] Phone: [phone]   Fax:  [phone]  Name: Kiara Miller MRN: [id number] Date of Birth: 1950-12-11

## 2018-12-02 ENCOUNTER — Other Ambulatory Visit: Payer: Self-pay

## 2018-12-02 ENCOUNTER — Ambulatory Visit: Payer: Medicare Other | Attending: Orthopedic Surgery | Admitting: Physical Therapy

## 2018-12-02 ENCOUNTER — Encounter: Payer: Self-pay | Admitting: Physical Therapy

## 2018-12-02 DIAGNOSIS — M25561 Pain in right knee: Secondary | ICD-10-CM | POA: Diagnosis not present

## 2018-12-02 NOTE — Therapy (Signed)
[facility] [address] [place], [place], [address] Phone: [phone]   Fax:  [phone]  Physical Therapy Treatment  Patient Details  Name: Kiara Miller MRN: [id number] Date of Birth: 07/02/1950 Referring Provider (PT): Lanney Gins, PA-C/ Charlann Boxer, MD   Encounter Date: 12/02/2018  PT End of Session - 12/02/18 1113    Visit Number  1    Number of Visits  6    Date for PT Re-Evaluation  12/23/18    PT Start Time  1036    PT Stop Time  1119    PT Time Calculation (min)  43 min    Activity Tolerance  Patient tolerated treatment well    Behavior During Therapy  [facility] for tasks assessed/performed       Past Medical History:  Diagnosis Date  . Diverticulitis April 2017  . Hyperlipidemia   . Hypertension   . Joint pain   . Polyarthralgia   . Right knee pain     Past Surgical History:  Procedure Laterality Date  . ABDOMINAL HYSTERECTOMY    . CARPAL TUNNEL RELEASE    . MANDIBLE SURGERY    . NM MYOVIEW LTD  06/2017   (Ordered for coronary calcium score 376) LOW RISK.  EF 72%.  No ischemia or infarction.  No wall motion normality.  No change from previous.  . OSTEOTOMY PELVIS BILATERAL    . TONSILLECTOMY    . TONSILLECTOMY    . TOTAL KNEE ARTHROPLASTY Right 09/02/2018   Procedure: TOTAL KNEE ARTHROPLASTY;  Surgeon: Durene Romans, MD;  Location: [facility];  Service: Orthopedics;  Laterality: Right;  70 mins  . TRANSTHORACIC ECHOCARDIOGRAM  12/2010   EF 65-70%. Mild LVH. No RWMA/ Gr1 DD.  . TUBAL LIGATION    . WRIST FUSION      There were no vitals filed for this visit.  Subjective Assessment - 12/02/18 1103    Subjective  COVID-19 screen performed prior to patient entering clinic.  The patient returns to the clinic today following a successful right TKA and rehab.  However, she continues to reports right hamstring pain that is severe with certain movements of her knee especially those that require resisted right knee flexion.     Pertinent History  HTN, R TKA 09/02/2018, Rheumatoid Arthritis    Limitations  Sitting;Standing;Walking;House hold activities    Diagnostic tests  x-ray    Patient Stated Goals  get rid of pain, walk without walker    Pain Onset  More than a month ago    Pain Onset  1 to 4 weeks ago                       [facility] Adult PT Treatment/Exercise - 12/02/18 0001      Modalities   Modalities  Electrical Stimulation;Moist Heat      Electrical Stimulation   Electrical Stimulation Location  RT distal/lateral hamstrings.and distal ITB    Electrical Stimulation Action  Pre-mod.    Electrical Stimulation Parameters  80-150 Hz x 20 minutes.    Electrical Stimulation Goals  Edema;Pain             PT Education - 12/02/18 1114    Education Details  Instruct in short duration ice massage to distal hamstrings and ITB.       PT Short Term Goals - 10/03/18 1138      PT SHORT TERM GOAL #1   Title  Patient will be independent with HEP  Time  2    Period  Weeks    Status  Achieved      PT SHORT TERM GOAL #2   Title  Patient will demonstrate 90+ degrees of right knee flexion AROM to improve ROM for functional tasks    Time  2    Period  Weeks    Status  Achieved      PT SHORT TERM GOAL #3   Title  Patient will demonstrate 10 degrees or less of right knee extension AROM to improve gait mechanics.    Time  2    Period  Weeks    Status  Achieved        PT Long Term Goals - 12/02/18 1154      PT LONG TERM GOAL #1   Title  No complaints of right hamstring pain with right knee movement.    Time  4    Period  Weeks    Status  New            Plan - 12/02/18 1109    Clinical Impression Statement  Patient exhibiting right knee range of motion from 0 to 125 degrees with excellent quadriceps strength.  She is palpably tender at the distal Biceps Femoris and the distal 1/3 of her ITB.  Resisted right knee flexion produced significant pain for the patient.  She is  cautious how she walks to prevent pain.    Personal Factors and Comorbidities  Age;Comorbidity 1    Comorbidities  HTN, R TKA 09/02/2018    Examination-Activity Limitations  Bed Mobility;Dressing;Stand;Stairs;Squat;Sit;Transfers;Locomotion Level    Examination-Participation Restrictions  Cleaning;Driving    Stability/Clinical Decision Making  Stable/Uncomplicated    Rehab Potential  Good    PT Frequency  2x / week    PT Duration  3 weeks    PT Next Visit Plan  Electrical stimulation and STW/M and IASTM to right distal/lateral hamstrings and distal ITB.    Consulted and Agree with Plan of Care  Patient       Patient will benefit from skilled therapeutic intervention in order to improve the following deficits and impairments:  Decreased activity tolerance, Decreased range of motion, Decreased strength, Difficulty walking, Increased edema, Pain, Decreased knowledge of precautions  Visit Diagnosis: Acute pain of right knee     Problem List Patient Active Problem List   Diagnosis Date Noted  . Obese 09/03/2018  . 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 PAC (premature atrial contraction) 07/11/2017  . History of hypothyroidism 06/11/2017  . Essential hypertension 06/11/2017  . History of obesity 06/11/2017  . S/P right knee arthroscopy 06/11/2017  . DDD (degenerative disc disease), cervical 06/11/2017  . DDD (degenerative disc disease), lumbar 06/11/2017  . History of carpal tunnel surgery of right wrist 06/11/2017  . Primary osteoarthritis of both knees 06/11/2017  . Chest pain, precordial 05/05/2017  . PVC's (premature ventricular contractions) 05/03/2017  . Family history of premature CAD 05/03/2017  . Rheumatoid arthritis of multiple sites without rheumatoid factor ([place]) 10/10/2015  . Uveitis 10/10/2015  . Diverticulitis 10/10/2015  . Dyspnea 12/05/2010  . Palpitations 12/05/2010    Kiara Miller, [place]  MPT 12/02/2018, 11:56 AM  [facility] [address] [place], [place], [address] Phone: [phone]   Fax:  [phone]  Name: Kiara Miller MRN: [id number] Date of Birth: 07/29/1950

## 2018-12-04 ENCOUNTER — Other Ambulatory Visit: Payer: Self-pay

## 2018-12-04 ENCOUNTER — Ambulatory Visit: Payer: Medicare Other | Admitting: Physical Therapy

## 2018-12-04 DIAGNOSIS — M25561 Pain in right knee: Secondary | ICD-10-CM | POA: Diagnosis not present

## 2018-12-04 NOTE — Therapy (Signed)
[facility] [place], [place], [address] Phone: [phone]   Fax:  [phone]  Physical Therapy Treatment  Patient Details  Name: Kiara Miller MRN: [id number] Date of Birth: Aug 18, 1950 Referring Provider (PT): Danae Orleans, PA-C/ Alvan Dame, MD   Encounter Date: 12/04/2018  PT End of Session - 12/04/18 1519    Visit Number  2    Number of Visits  6    Date for PT Re-Evaluation  12/23/18    PT Start Time  0225    PT Stop Time  0319    PT Time Calculation (min)  54 min    Activity Tolerance  Patient tolerated treatment well    Behavior During Therapy  [facility] for tasks assessed/performed       Past Medical History:  Diagnosis Date  . Diverticulitis April 2017  . Hyperlipidemia   . Hypertension   . Joint pain   . Polyarthralgia   . Right knee pain     Past Surgical History:  Procedure Laterality Date  . ABDOMINAL HYSTERECTOMY    . CARPAL TUNNEL RELEASE    . MANDIBLE SURGERY    . NM MYOVIEW LTD  06/2017   (Ordered for coronary calcium score 376) LOW RISK.  EF 72%.  No ischemia or infarction.  No wall motion normality.  No change from previous.  . OSTEOTOMY PELVIS BILATERAL    . TONSILLECTOMY    . TONSILLECTOMY    . 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 TRANSTHORACIC ECHOCARDIOGRAM  12/2010   EF 65-70%. Mild LVH. No RWMA/ Gr1 DD.  . TUBAL LIGATION    . WRIST FUSION      There were no vitals filed for this visit.  Subjective Assessment - 12/04/18 1516    Subjective  COVID-19 screen performed prior to patient entering clinic.  Pain about a 2 today.    Pertinent History  HTN, R TKA 09/02/2018, Rheumatoid Arthritis    Limitations  Sitting;Standing;Walking;House hold activities    Diagnostic tests  x-ray                       [facility] Adult PT  Treatment/Exercise - 12/04/18 0001      Moist Heat Therapy   Number Minutes Moist Heat  20 Minutes    Moist Heat Location  --   RT knee.     Acupuncturist Location  RT distal hamstrings and ITB    Electrical Stimulation Action  Pre-mod.    Electrical Stimulation Parameters  80-150 Hz x 20 minutes.    Electrical Stimulation Goals  Pain      Manual Therapy   Manual Therapy  Soft tissue mobilization    Soft tissue mobilization  In prone:  STW/M x 23 minutes to right hamstrings and distal ITB x 23 minutes.                  PT Long Term Goals - 12/02/18 1154      PT LONG TERM GOAL #1   Title  No complaints of right hamstring pain with right knee movement.    Time  4    Period  Weeks    Status  New            Plan - 12/04/18 1519    Clinical Impression  Statement  Patient tolerated treatment well today.  Continued tenderness over right distal hamstrings and ITB.    Personal Factors and Comorbidities  Age;Comorbidity 1    Comorbidities  HTN, R TKA 09/02/2018    Examination-Activity Limitations  Bed Mobility;Dressing;Stand;Stairs;Squat;Sit;Transfers;Locomotion Level       Patient will benefit from skilled therapeutic intervention in order to improve the following deficits and impairments:  Decreased activity tolerance, Decreased range of motion, Decreased strength, Difficulty walking, Increased edema, Pain, Decreased knowledge of precautions  Visit Diagnosis: Acute pain of right knee     Problem List Patient Active Problem List   Diagnosis Date Noted  . Obese 09/03/2018  . 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 PAC (premature atrial contraction) 07/11/2017  . History of hypothyroidism 06/11/2017  . Essential hypertension 06/11/2017  . History of obesity 06/11/2017  . S/P right knee arthroscopy 06/11/2017  . DDD (degenerative disc disease), cervical 06/11/2017  . DDD (degenerative disc disease), lumbar 06/11/2017  . History of  carpal tunnel surgery of right wrist 06/11/2017  . Primary osteoarthritis of both knees 06/11/2017  . Chest pain, precordial 05/05/2017  . PVC's (premature ventricular contractions) 05/03/2017  . Family history of premature CAD 05/03/2017  . 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 Uveitis 10/10/2015  . Diverticulitis 10/10/2015  . Dyspnea 12/05/2010  . Palpitations 12/05/2010    Kiara Miller, [place] MPT 12/04/2018, 3:26 PM  [facility] [address] [place], [place], [address] Phone: [phone]   Fax:  [phone]  Name: Kiara Miller MRN: [id number] Date of Birth: 03-09-1950

## 2018-12-08 ENCOUNTER — Ambulatory Visit: Payer: Medicare Other | Attending: Orthopedic Surgery | Admitting: Physical Therapy

## 2018-12-08 ENCOUNTER — Other Ambulatory Visit: Payer: Self-pay

## 2018-12-08 DIAGNOSIS — M25561 Pain in right knee: Secondary | ICD-10-CM | POA: Insufficient documentation

## 2018-12-08 NOTE — Therapy (Addendum)
[facility] [place], [place], [address] Phone: [phone]   Fax:  [phone]  Physical Therapy Treatment  Patient Details  Name: Kiara Miller MRN: [id number] Date of Birth: 1950-06-14 Referring Provider (PT): Danae Orleans, PA-C/ Alvan Dame, MD   Encounter Date: 12/08/2018  PT End of Session - 12/08/18 1454    Visit Number  3    Number of Visits  6    Date for PT Re-Evaluation  12/23/18    PT Start Time  0145    PT Stop Time  0239    PT Time Calculation (min)  54 min    Activity Tolerance  Patient tolerated treatment well    Behavior During Therapy  [facility] for tasks assessed/performed       Past Medical History:  Diagnosis Date  . Diverticulitis April 2017  . Hyperlipidemia   . Hypertension   . Joint pain   . Polyarthralgia   . Right knee pain     Past Surgical History:  Procedure Laterality Date  . ABDOMINAL HYSTERECTOMY    . CARPAL TUNNEL RELEASE    . MANDIBLE SURGERY    . NM MYOVIEW LTD  06/2017   (Ordered for coronary calcium score 376) LOW RISK.  EF 72%.  No ischemia or infarction.  No wall motion normality.  No change from previous.  . OSTEOTOMY PELVIS BILATERAL    . TONSILLECTOMY    . TONSILLECTOMY    . 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 TRANSTHORACIC ECHOCARDIOGRAM  12/2010   EF 65-70%. Mild LVH. No RWMA/ Gr1 DD.  . TUBAL LIGATION    . WRIST FUSION      There were no vitals filed for this visit.  Subjective Assessment - 12/08/18 1454    Subjective  COVID-19 screen performed prior to patient entering clinic.  Felt much better over the weekend and did a lot.    Pertinent History  HTN, R TKA 09/02/2018, Rheumatoid Arthritis    Limitations  Sitting;Standing;Walking;House hold activities    Diagnostic tests  x-ray    Patient Stated Goals  get rid of pain,  walk without walker    Currently in Pain?  Yes    Pain Score  3     Pain Location  Knee    Pain Orientation  Right    Pain Descriptors / Indicators  Dull;Discomfort    Pain Type  Surgical pain    Pain Onset  More than a month ago                       [facility] Adult PT Treatment/Exercise - 12/08/18 0001      Exercises   Exercises  Knee/Hip      Knee/Hip Exercises: Aerobic   Stationary Bike  Level 3 x 5 minutes.      Moist Heat Therapy   Number Minutes Moist Heat  20 Minutes    Moist Heat Location  --   Right distal hamstrings.     Acupuncturist Location  RT distal hamstrings.    Electrical Stimulation Action  Pre-mod.    Electrical Stimulation Parameters  80-150 Hz x 20 minutes.    Electrical Stimulation Goals  Pain      Manual Therapy   Manual Therapy  Soft tissue mobilization    Soft  tissue mobilization  In prone:  STW/M including IASTM x 18 minutes to hamstrings and gastroc heads.                  PT Long Term Goals - 12/02/18 1154      PT LONG TERM GOAL #1   Title  No complaints of right hamstring pain with right knee movement.    Time  4    Period  Weeks    Status  New            Plan - 12/08/18 1457    Clinical Impression Statement  The patient has responded well to treatments and reports she was able to do a lot of activites over the weekend with less pain.    Personal Factors and Comorbidities  Age;Comorbidity 1    Comorbidities  HTN, R TKA 09/02/2018    Examination-Activity Limitations  Bed Mobility;Dressing;Stand;Stairs;Squat;Sit;Transfers;Locomotion Level    Examination-Participation Restrictions  Cleaning;Driving    Stability/Clinical Decision Making  Stable/Uncomplicated    Rehab Potential  Good    PT Frequency  2x / week    PT Duration  3 weeks    PT Treatment/Interventions  ADLs/Self Care Home Management;Cryotherapy;Electrical Stimulation;Moist Heat;Gait training;Stair  training;Functional mobility training;Therapeutic activities;Therapeutic exercise;Balance training;Passive range of motion;Patient/family education;Neuromuscular re-education;Manual techniques;Taping;Vasopneumatic Device    PT Next Visit Plan  Electrical stimulation and STW/M and IASTM to right distal/lateral hamstrings and distal ITB.    Consulted and Agree with Plan of Care  Patient       Patient will benefit from skilled therapeutic intervention in order to improve the following deficits and impairments:  Decreased activity tolerance, Decreased range of motion, Decreased strength, Difficulty walking, Increased edema, Pain, Decreased knowledge of precautions  Visit Diagnosis: Acute pain of right knee     Problem List Patient Active Problem List   Diagnosis Date Noted  . Obese 09/03/2018  . 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 PAC (premature atrial contraction) 07/11/2017  . History of hypothyroidism 06/11/2017  . Essential hypertension 06/11/2017  . History of obesity 06/11/2017  . S/P right knee arthroscopy 06/11/2017  . DDD (degenerative disc disease), cervical 06/11/2017  . DDD (degenerative disc disease), lumbar 06/11/2017  . History of carpal tunnel surgery of right wrist 06/11/2017  . Primary osteoarthritis of both knees 06/11/2017  . Chest pain, precordial 05/05/2017  . PVC's (premature ventricular contractions) 05/03/2017  . Family history of premature CAD 05/03/2017  . Rheumatoid arthritis of multiple sites without rheumatoid factor ([place]) 10/10/2015  . Uveitis 10/10/2015  . Diverticulitis 10/10/2015  . Dyspnea 12/05/2010  . Palpitations 12/05/2010   PHYSICAL THERAPY DISCHARGE SUMMARY  Visits from Start of Care: 3.  Current functional level related to goals / functional outcomes: See above.   Remaining deficits: See below.   Education / Equipment: HEP.  Plan: Patient agrees to discharge.  Patient goals were not met. Patient is being discharged due to not returning  since the last visit.  ?????      Rylon Poitra, [place] MPT 12/08/2018, 3:00 PM  [facility] [address] [place], [place], [address] Phone: [phone]   Fax:  [phone]  Name: Kiara Miller MRN: [id number] Date of Birth: 28-Sep-1950

## 2018-12-09 ENCOUNTER — Ambulatory Visit
Admission: RE | Admit: 2018-12-09 | Discharge: 2018-12-09 | Disposition: A | Discharge status: Home / Self Care | Payer: Medicare Other | Source: Ambulatory Visit | Attending: Family Medicine | Admitting: Family Medicine

## 2018-12-09 DIAGNOSIS — Z1231 Encounter for screening mammogram for malignant neoplasm of breast: Secondary | ICD-10-CM

## 2018-12-11 ENCOUNTER — Encounter: Payer: Medicare Other | Admitting: Physical Therapy

## 2018-12-22 NOTE — Progress Notes (Signed)
Office Visit Note  Patient: Kiara Miller             Date of Birth: 1950/06/08           MRN: [id number]             PCP: Bernerd Limbo, MD Referring: Bernerd Limbo, MD Visit Date: 12/24/2018 Occupation: '@GUAROCC' @  Subjective:  Muscle fatigue   History of Present Illness: Kiara Miller is a 68 y.o. female with history of seronegative rheumatoid arthritis and osteoarthritis.  Patient is not taking any immunosuppressive agents at this time.  She states that she was using CBD oil which was beneficial to help with the discomfort in her hands and feet.  She states that she fell in May 2020 and injured her right knee joint.  She states she was evaluated by Dr. Ihor Gully was scheduled for a right knee replacement on 09/02/2018.  She performed home exercises and went to physical therapy and has noticed significant improvement in her discomfort.  She states that she has been experiencing increased muscle weakness and muscle fatigue in bilateral upper and lower extremities despite trying to stay active and exercise on a regular basis.  She has no difficulty getting up from a chair or climbing steps.  She has no difficulty raising her arms above her head.  She has been taking ibuprofen 800 mg 2-3 times per day for pain relief.  Activities of Daily Living:  Patient reports morning stiffness for 1-1.5   hours.   Patient Reports nocturnal pain.  Difficulty dressing/grooming: Denies Difficulty climbing stairs: Denies Difficulty getting out of chair: Denies Difficulty using hands for taps, buttons, cutlery, and/or writing: Reports  Review of Systems  Constitutional: Positive for fatigue.  HENT: Negative for mouth sores, mouth dryness and nose dryness.   Eyes: Negative for pain, visual disturbance and dryness.  Respiratory: Negative for cough, hemoptysis, shortness of breath and difficulty breathing.   Cardiovascular: Negative for chest pain, palpitations, hypertension and swelling in legs/feet.   Gastrointestinal: Negative for blood in stool, constipation and diarrhea.  Endocrine: Negative for increased urination.  Genitourinary: Negative for painful urination.  Musculoskeletal: Positive for arthralgias, joint pain, joint swelling, myalgias, muscle tenderness and myalgias. Negative for muscle weakness and morning stiffness.  Skin: Negative for color change, pallor, rash, hair loss, nodules/bumps, skin tightness, ulcers and sensitivity to sunlight.  Allergic/Immunologic: Negative for susceptible to infections.  Neurological: Negative for dizziness, numbness, headaches and weakness.  Hematological: Negative for swollen glands.  Psychiatric/Behavioral: Positive for depressed mood. Negative for sleep disturbance. The patient is not nervous/anxious.     PMFS History:  Patient Active Problem List   Diagnosis Date Noted  . Obese 09/03/2018  . 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 PAC (premature atrial contraction) 07/11/2017  . History of hypothyroidism 06/11/2017  . Essential hypertension 06/11/2017  . History of obesity 06/11/2017  . S/P right knee arthroscopy 06/11/2017  . DDD (degenerative disc disease), cervical 06/11/2017  . DDD (degenerative disc disease), lumbar 06/11/2017  . History of carpal tunnel surgery of right wrist 06/11/2017  . Primary osteoarthritis of both knees 06/11/2017  . Chest pain, precordial 05/05/2017  . PVC's (premature ventricular contractions) 05/03/2017  . Family history of premature CAD 05/03/2017  . Rheumatoid arthritis of multiple sites without rheumatoid factor ([place]) 10/10/2015  . Uveitis 10/10/2015  . Diverticulitis 10/10/2015  . Dyspnea 12/05/2010  . Palpitations 12/05/2010    Past Medical History:  Diagnosis Date  . Diverticulitis April 2017  . Hyperlipidemia   .  Hypertension   . Joint pain   . Polyarthralgia   . Right knee pain     Family History  Problem Relation Age of Onset  . Hypertension Father   . Lung disease Brother    Past  Surgical History:  Procedure Laterality Date  . ABDOMINAL HYSTERECTOMY    . CARPAL TUNNEL RELEASE    . MANDIBLE SURGERY    . NM MYOVIEW LTD  06/2017   (Ordered for coronary calcium score 376) LOW RISK.  EF 72%.  No ischemia or infarction.  No wall motion normality.  No change from previous.  . OSTEOTOMY PELVIS BILATERAL    . TONSILLECTOMY    . TONSILLECTOMY    . 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 TRANSTHORACIC ECHOCARDIOGRAM  12/2010   EF 65-70%. Mild LVH. No RWMA/ Gr1 DD.  . TUBAL LIGATION    . WRIST FUSION     Social History   Social History Narrative  . Not on file   Immunization History  Administered Date(s) Administered  . Pneumococcal Polysaccharide-23 07/07/2012     Objective: Vital Signs: BP (!) 144/77 (BP Location: Right Wrist, Patient Position: Sitting, Cuff Size: Normal)   Pulse (!) 54   Resp 14   Ht 5' 1.5" (1.562 m)   Wt 181 lb 3.2 oz (82.2 kg)   BMI 33.68 kg/m    Physical Exam Vitals signs and nursing note reviewed.  Constitutional:      Appearance: She is well-developed.  HENT:     Head: Normocephalic and atraumatic.  Eyes:     Conjunctiva/sclera: Conjunctivae normal.  Neck:     Musculoskeletal: Normal range of motion.  Cardiovascular:     Rate and Rhythm: Normal rate and regular rhythm.     Heart sounds: Normal heart sounds.  Pulmonary:     Effort: Pulmonary effort is normal.     Breath sounds: Normal breath sounds.  Abdominal:     General: Bowel sounds are normal.     Palpations: Abdomen is soft.  Lymphadenopathy:     Cervical: No cervical adenopathy.  Skin:    General: Skin is warm and dry.     Capillary Refill: Capillary refill takes less than 2 seconds.  Neurological:     Mental Status: She is alert and oriented to person, place, and time.  Psychiatric:        Behavior: Behavior normal.       Musculoskeletal Exam: C-spine, thoracic spine, lumbar spine good range of motion.  No midline spinal tenderness.  No SI joint tenderness.  Shoulder joints have good range of motion with some discomfort with internal rotation of the right shoulder.  Elbow joints, wrist joints, MCPs, PIPs, DIPs good range of motion no synovitis.  She has PIP and DIP synovial thickening consistent with osteoarthritis of both hands.  Hip joints have good range of motion with no discomfort.  Right knee replacement has good range of motion with warmth.  Left knee has good range of motion with no warmth or effusion.  Ankle joints have good range of motion with no tenderness or inflammation.  CDAI Exam: CDAI Score: 0.6  Patient Global: 3 mm; Provider Global: 3 mm Swollen: 0 ; Tender: 0  Joint Exam   No joint exam has been documented for this visit   There is currently no information documented on the homunculus.  Go to the Rheumatology activity and complete the homunculus joint exam.  Investigation: No additional findings.  Imaging: Mm 3d Screen Breast Bilateral  Result Date: 12/09/2018 CLINICAL DATA:  Screening. EXAM: DIGITAL SCREENING BILATERAL MAMMOGRAM WITH TOMO AND CAD COMPARISON:  Previous exam(s). ACR Breast Density Category b: There are scattered areas of fibroglandular density. FINDINGS: There are no findings suspicious for malignancy. Images were processed with CAD. IMPRESSION: No mammographic evidence of malignancy. A result letter of this screening mammogram will be mailed directly to the patient. RECOMMENDATION: Screening mammogram in one year. (Code:SM-B-01Y) BI-RADS CATEGORY  1: Negative. Electronically Signed   By: Audie Pinto M.D.   On: 12/09/2018 15:25    Recent Labs: Lab Results  Component Value Date   WBC 10.9 (H) 09/21/2018   HGB 13.7 09/21/2018   PLT 214 09/21/2018   NA 140 09/21/2018   K 3.7 09/21/2018   CL 104 09/21/2018   CO2 26 09/21/2018   GLUCOSE 115 (H) 09/21/2018   BUN  26 (H) 09/21/2018   CREATININE 0.75 09/21/2018   BILITOT 0.9 09/21/2018   ALKPHOS 50 09/21/2018   AST 21 09/21/2018   ALT 20 09/21/2018   PROT 7.3 09/21/2018   ALBUMIN 4.1 09/21/2018   CALCIUM 9.4 09/21/2018   GFRAA >60 09/21/2018   QFTBGOLD Negative 08/30/2016    Speciality Comments: Orencia IV 774m every 4 weeks, TB gold negative 08/30/16  Procedures:  No procedures performed Allergies: Clindamycin/lincomycin, Codeine, Leflunomide, Penicillins, Statins, Tetracyclines & related, and Levofloxacin   Assessment / Plan:     Visit Diagnoses: Rheumatoid arthritis of multiple sites without rheumatoid factor ([place] - 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  She is currently not any immunosuppressive agents at this time.  She had an ultrasound of both hands performed on 12/11/2017 which revealed synovitis in bilateral wrist joints and the right second MCP joint.  She was encouraged to start on Plaquenil at her last visit on 12/11/17 but did she did not start due to wanting to try CBD oil instead.  According to the patient initially CBD oil was controlling her symptoms but recently she has been having increased pain in both hands and both feet.  She has not noticed any joint swelling.  High risk medication use - d/c Orencia IV 10/2016. (patient tried sulfasalazine-nausea, methotrexate-fatigue, Arava-epigastric pain, inadequate response to Humira 2015, Enbrel 2016.   Myalgia-patient complains of increased pain and weakness in her muscles.  We will obtain CK and sed rate today.  Primary osteoarthritis of left knee: She has good range of motion of the left knee joint on exam.  No warmth or effusion was noted.  S/P total knee arthroplasty, right: Performed by Dr. OAlvan Dameon 09/02/2018.  She performed home exercises as well as physical therapy.  She has good range of motion with no discomfort on exam.  She has warmth but no effusion.   DDD (degenerative disc disease), cervical: She has good range of motion with no discomfort.  No symptoms of radiculopathy noted.  DDD (degenerative disc disease), lumbar: She has been experiencing increased lower back pain since her right knee replacement.  She has no symptoms of radiculopathy.  Coccyx pain - MRI was unremarkable in the past.  She had a nerve block which resolved her discomfort.  Status post fusion of wrist (left): She has limited range of motion.  No tenderness or inflammation noted on exam  History of carpal tunnel surgery of right  wrist: Asymptomatic at this time.  Other medical conditions are listed as follows:  History of hypertension  History of diverticulitis  History of hypothyroidism  Smoker  Orders: Orders Placed This Encounter  Procedures  . CK  . ESR   No orders of the defined types were placed in this encounter.   Face-to-face time spent with patient was 30 minutes. Greater than 50% of time was spent in counseling and coordination of care.  Follow-Up Instructions: Return in about 5 months (around 05/24/2019) for Rheumatoid arthritis, Osteoarthritis.   Hazel Sams, PA-C  Note - This record has been created using Dragon software.  Chart creation errors have been sought, but may not always  have been located. Such creation errors do not reflect on  the standard of medical care.

## 2018-12-24 ENCOUNTER — Other Ambulatory Visit: Payer: Self-pay

## 2018-12-24 ENCOUNTER — Encounter: Payer: Self-pay | Admitting: Physician Assistant

## 2018-12-24 ENCOUNTER — Ambulatory Visit (INDEPENDENT_AMBULATORY_CARE_PROVIDER_SITE_OTHER): Payer: Medicare Other | Admitting: Physician Assistant

## 2018-12-24 VITALS — BP 144/77 | HR 54 | Resp 14 | Ht 61.5 in | Wt 181.2 lb

## 2018-12-24 DIAGNOSIS — Z981 Arthrodesis status: Secondary | ICD-10-CM

## 2018-12-24 DIAGNOSIS — F17200 Nicotine dependence, unspecified, uncomplicated: Secondary | ICD-10-CM

## 2018-12-24 DIAGNOSIS — M0609 Rheumatoid arthritis without rheumatoid factor, multiple sites: Secondary | ICD-10-CM

## 2018-12-24 DIAGNOSIS — Z79899 Other long term (current) drug therapy: Secondary | ICD-10-CM | POA: Diagnosis not present

## 2018-12-24 DIAGNOSIS — M533 Sacrococcygeal disorders, not elsewhere classified: Secondary | ICD-10-CM

## 2018-12-24 DIAGNOSIS — M791 Myalgia, unspecified site: Secondary | ICD-10-CM

## 2018-12-24 DIAGNOSIS — Z9889 Other specified postprocedural states: Secondary | ICD-10-CM

## 2018-12-24 DIAGNOSIS — M5030 Other cervical disc degeneration, unspecified cervical region: Secondary | ICD-10-CM

## 2018-12-24 DIAGNOSIS — Z8639 Personal history of other endocrine, nutritional and metabolic disease: Secondary | ICD-10-CM

## 2018-12-24 DIAGNOSIS — Z96651 Presence of right artificial knee joint: Secondary | ICD-10-CM

## 2018-12-24 DIAGNOSIS — M1712 Unilateral primary osteoarthritis, left knee: Secondary | ICD-10-CM | POA: Diagnosis not present

## 2018-12-24 DIAGNOSIS — Z8679 Personal history of other diseases of the circulatory system: Secondary | ICD-10-CM

## 2018-12-24 DIAGNOSIS — Z8719 Personal history of other diseases of the digestive system: Secondary | ICD-10-CM

## 2018-12-24 DIAGNOSIS — M5136 Other intervertebral disc degeneration, lumbar region: Secondary | ICD-10-CM

## 2018-12-25 LAB — SEDIMENTATION RATE: Sed Rate: 11 mm/h (ref 0–30)

## 2018-12-25 LAB — CK: Total CK: 36 U/L (ref 29–143)

## 2018-12-25 NOTE — Progress Notes (Signed)
Within normal limits

## 2019-01-06 ENCOUNTER — Other Ambulatory Visit: Payer: Self-pay | Admitting: Cardiology

## 2019-01-08 ENCOUNTER — Other Ambulatory Visit: Payer: Self-pay

## 2019-01-08 ENCOUNTER — Ambulatory Visit (INDEPENDENT_AMBULATORY_CARE_PROVIDER_SITE_OTHER): Payer: Medicare Other | Admitting: Cardiology

## 2019-01-08 VITALS — BP 126/70 | HR 51 | Temp 96.9°F | Ht 61.5 in | Wt 181.0 lb

## 2019-01-08 DIAGNOSIS — R002 Palpitations: Secondary | ICD-10-CM | POA: Diagnosis not present

## 2019-01-08 DIAGNOSIS — I493 Ventricular premature depolarization: Secondary | ICD-10-CM | POA: Diagnosis not present

## 2019-01-08 DIAGNOSIS — R931 Abnormal findings on diagnostic imaging of heart and coronary circulation: Secondary | ICD-10-CM

## 2019-01-08 DIAGNOSIS — I491 Atrial premature depolarization: Secondary | ICD-10-CM | POA: Diagnosis not present

## 2019-01-08 DIAGNOSIS — I10 Essential (primary) hypertension: Secondary | ICD-10-CM

## 2019-01-08 MED ORDER — METOPROLOL SUCCINATE ER 25 MG PO TB24: 25.0000 mg | ORAL_TABLET | Freq: Every day | ORAL | 3 refills | Status: DC

## 2019-01-08 NOTE — Progress Notes (Signed)
Primary Care Provider: Bernerd Limbo, MD Cardiologist: No primary care provider on file. Electrophysiologist:   Clinic Note: Chief Complaint  Patient presents with  . Follow-up    no real CV complaints  . Palpitations    None, but notes some fatigue   HPI:    Kiara Miller is a 68 y.o. female with a PMH below who presents today for annual follow-up with irregular heartbeat and chest pain episodes. She has hypertension, hyperlipidemia and family history future CAD (father-MI at age 86, paternal grandmother aortic aneurysm)..   Cardiology Evaluation in 2012 (Dr. Acie Fredrickson) --> evaluated with an echocardiogram and Lexiscan Myoview (see [place])  Initially seen by me back on May 03, 2017 for off-and-on episodes of skipping heartbeats and chest discomfort. She was evaluated with a coronary calcium score which was somewhat elevated and therefore followed up with a Myoview stress test-reviewed and PSH below.  She herself purchased a portable home cardiac monitor (EMAY) showing PACs, PVCs with some trigeminy.Kiara Miller was last seen on January 07, 2018.  Palpitations well controlled on current dose of beta-blocker.  Had moved to arthritis pains but otherwise doing well.  Recent Hospitalizations:   September 02, 2018: Right knee arthroplasty  September 21, 2018-ER visit for hives  Reviewed  CV studies:    The following studies were reviewed today: (if available, images/films reviewed: From Epic Chart or Care Everywhere) . None:   Interval History:   Kiara Miller returns this year stating that she has had a rough year she had some significant right knee pain and had surgery.  She is then had a bout of vertigo.  She has been pretty sedentary over the last several months.  She did not have a major issue after knee surgery with extensive hives that was probably medication reaction to something from the knee surgery.  After being treated with steroids and Benadryl, she really has not  fully recovered she has been really fatigued and weak since then. She has been pretty sedentary now because of joint pains both her hips are really bothering her and now her knees are starting hurt.  This in addition to the fact that she has gained weight since she quit smoking has caused her to gain a lot of weight.  Thankfully, she really has not been noticing anything in the way of chest pain pressure or dyspnea.  She is just been sedentary and has not been doing very much.  No real significant palpitations.  Mild end of day edema but no PND, or orthopnea.  CV Review of Symptoms (Summary) positive for - dyspnea on exertion, edema and DOE is probably because of weight gain and deconditioning.  Mild end of day edema negative for - chest pain, irregular heartbeat, orthopnea, palpitations, paroxysmal nocturnal dyspnea, rapid heart rate, shortness of breath or Syncope/near syncope, TIA/amaurosis fugax, claudication  The patient does not have symptoms concerning for COVID-19 infection (fever, chills, cough, or new shortness of breath).  The patient is practicing social distancing. ++ Masking.  She does go out for groceries/shopping.    REVIEWED OF SYSTEMS   A comprehensive ROS was performed. Review of Systems  Constitutional: Negative for weight loss (Weight gain).  HENT: Negative for congestion and nosebleeds.   Respiratory: Negative for cough, shortness of breath and wheezing.   Cardiovascular: Negative for claudication.  Gastrointestinal: Negative for blood in stool and melena.  Genitourinary: Negative for hematuria.  Musculoskeletal: Positive for joint pain (Still has stiffness  in her right knee).  Neurological: Negative for dizziness and headaches.  Psychiatric/Behavioral: Negative for memory loss. The patient is not nervous/anxious and does not have insomnia.   All other systems reviewed and are negative.  I have reviewed and (if needed) personally updated the patient's problem list,  medications, allergies, past medical and surgical history, social and family history.   PAST MEDICAL HISTORY   Past Medical History:  Diagnosis Date  . Diverticulitis April 2017  . Hyperlipidemia   . Hypertension   . Joint pain   . Polyarthralgia   . Right knee pain     PAST SURGICAL HISTORY   Past Surgical History:  Procedure Laterality Date  . ABDOMINAL HYSTERECTOMY    . CARPAL TUNNEL RELEASE    . MANDIBLE SURGERY    . NM MYOVIEW LTD  06/2017   (Ordered for coronary calcium score 376) LOW RISK.  EF 72%.  No ischemia or infarction.  No wall motion normality.  No change from previous.  . OSTEOTOMY PELVIS BILATERAL    . TONSILLECTOMY    . TONSILLECTOMY    . TOTAL KNEE ARTHROPLASTY Right 09/02/2018   Procedure: TOTAL KNEE ARTHROPLASTY;  Surgeon: Durene Romans, MD;  Location: [facility];  Service: Orthopedics;  Laterality: Right;  70 mins  . TRANSTHORACIC ECHOCARDIOGRAM  12/2010   EF 65-70%. Mild LVH. No RWMA/ Gr1 DD.  . TUBAL LIGATION    . WRIST FUSION       MEDICATIONS/ALLERGIES   Current Meds  Medication Sig  . ketoconazole (NIZORAL) 2 % cream Apply 1 application topically daily as needed for irritation.  Marland Kitchen levothyroxine (SYNTHROID, LEVOTHROID) 75 MCG tablet Take 75 mcg by mouth daily before breakfast.  . losartan-hydrochlorothiazide (HYZAAR) 100-25 MG per tablet Take 1 tablet by mouth daily.  . metoprolol succinate (TOPROL-XL) 25 MG 24 hr tablet Take 1 tablet (25 mg total) by mouth daily.  . montelukast (SINGULAIR) 10 MG tablet Take 10 mg by mouth at bedtime.    . NON FORMULARY Place 33 mg under the tongue 2 (two) times a day. CBD Oil  . Omega-3 Fatty Acids (FISH OIL) 1200 MG CAPS Take 1,200 mg by mouth 2 (two) times daily.    . Vitamin D, Ergocalciferol, (DRISDOL) 50000 UNITS CAPS capsule Take 50,000 Units by mouth every Wednesday.   . [DISCONTINUED] metoprolol succinate (TOPROL-XL) 25 MG 24 hr tablet TAKE 1 TABLET DAILY    Allergies  Allergen Reactions  .  Clindamycin/Lincomycin Other (See Comments)    Severe esophagitis  . Codeine Itching  . Leflunomide Nausea Only  . Penicillins Itching    Did it involve swelling of the face/tongue/throat, SOB, or low BP? No Did it involve sudden or severe rash/hives, skin peeling, or any reaction on the inside of your mouth or nose? No Did you need to seek medical attention at a hospital or doctor's office? No When did it last happen?2010 If all above answers are "NO", may proceed with cephalosporin use.   . Statins   . Tetracyclines & Related Nausea And Vomiting  . Levofloxacin Nausea Only and Palpitations     SOCIAL HISTORY/FAMILY HISTORY   Social History   Tobacco Use  . Smoking status: Former Smoker    Packs/day: 0.50    Years: 48.00    Pack years: 24.00    Types: Cigarettes    Quit date: 08/18/2018    Years since quitting: 0.4  . Smokeless tobacco: Never Used  Substance Use Topics  . Alcohol use: No  .  Drug use: No   Social History   Social History Narrative  . Not on file    Family History family history includes Hypertension in her father; Lung disease in her brother.   OBJCTIVE -PE, EKG, labs   Wt Readings from Last 3 Encounters:  01/08/19 181 lb (82.1 kg)  12/24/18 181 lb 3.2 oz (82.2 kg)  09/21/18 170 lb (77.1 kg)  December 2019: Weight 164 pounds.  Physical Exam: BP 126/70   Pulse (!) 51   Temp (!) 96.9 F (36.1 C)   Ht 5' 1.5" (1.562 m)   Wt 181 lb (82.1 kg)   SpO2 98%   BMI 33.65 kg/m  Physical Exam  Constitutional: She is oriented to person, place, and time. She appears well-developed and well-nourished.  Notably heavier than before.  But no obvious distress.  Well-groomed.  HENT:  Head: Normocephalic and atraumatic.  Neck: Normal range of motion. Neck supple. Normal carotid pulses, no hepatojugular reflux and no JVD present. Carotid bruit is not present.  Cardiovascular: Normal rate, regular rhythm and intact distal pulses. Exam reveals no  gallop and no friction rub.  No murmur heard. Pulmonary/Chest: Effort normal and breath sounds normal. No respiratory distress. She has no wheezes.  Musculoskeletal: Normal range of motion.        General: Edema (Trivial pedal) present.  Neurological: She is alert and oriented to person, place, and time.  Psychiatric: She has a normal mood and affect. Her behavior is normal. Judgment and thought content normal.  Vitals reviewed.   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  With this we want to be aggressive with lipid management.  I do not have recent lipids as they are being checked by her PCP.  She should be due to have labs checked by PCP.  Target LDL should be less than 100 and would actually shoot for less than 70 to minimize progression of disease.  She is not currently on a statin.  She is taking fish oil.  We will defer to PCP, but need to be aggressive.      Relevant Medications   metoprolol succinate (TOPROL-XL) 25 MG 24 hr tablet   Essential hypertension (Chronic)    Doing well on current medications.  No change.  I will think reducing her Toprol dose will change the blood pressure that much.      Relevant Medications   metoprolol succinate (TOPROL-XL) 25 MG 24 hr tablet   Palpitations (Chronic)    Controlled.  Will reduce dose of beta-blocker because of fatigue.      PVC's (premature ventricular contractions) - Primary (Chronic)    Negative ischemic evaluation.  Borderline burden.  Seems to be well controlled on current dose of beta-blocker.   With fatigue we will see if we can potentially back down on her dose of Toprol to half tablet and for her to take it at dinnertime as opposed to bedtime.      Relevant Medications  metoprolol succinate (TOPROL-XL) 25 MG 24 hr tablet   PAC (premature atrial contraction) (Chronic)    Better controlled since going from amlodipine to beta-blocker.  Should be on tolerate reducing dose.      Relevant Medications   metoprolol succinate (TOPROL-XL) 25 MG 24 hr tablet      COVID-19 Education: The signs and symptoms of COVID-19 were discussed with the patient and how to seek care for testing (follow up with PCP or arrange E-visit).   The importance of social distancing was discussed today.  I spent a total of 18 minutes with the patient and chart review. >  50% of the time was spent in direct patient consultation.  Additional time spent with chart review (studies, outside notes, etc): 6 Total Time: 24 min   Current medicines are reviewed at length with the patient today.  (+/- concerns) ? Had ? About energy level   Patient Instructions / Medication Changes & Studies & Tests Ordered   Patient Instructions  Medication Instructions:   Toprol XL   Decrease to /12/ tablet of 25  mg --  Take at dinnertime If palpations  Increase  Take a full tablet  *If you need a refill on your cardiac medications before your next appointment, please call your pharmacy*  Lab Work: Not needed  Testing/Procedures: Not needed  Follow-Up: At [facility], you and your health needs are our priority.  As part of our continuing mission to provide you with exceptional heart care, we have created designated Provider Care Teams.  These Care Teams include your primary Cardiologist (physician) and Advanced Practice Providers (APPs -  Physician Assistants and Nurse Practitioners) who all work together to provide you with the care you need, when you need it.  Your next appointment:   3 to 4  month(s)  The format  for your next appointment:   Virtual Visit   Provider:   Bryan Lemma, MD  Other Instructions    Studies Ordered:   No orders of the defined types were placed in this encounter.    Bryan Lemma, M.D., M.S. Interventional Cardiologist   Pager # [phone] Phone # [phone] [address] [place], [place] [address]   Thank you for choosing Heartcare at [facility]!!

## 2019-01-08 NOTE — Patient Instructions (Addendum)
Medication Instructions:   Toprol XL   Decrease to /12/ tablet of 25  mg --  Take at dinnertime If palpations  Increase  Take a full tablet  *If you need a refill on your cardiac medications before your next appointment, please call your pharmacy*  Lab Work: Not needed  Testing/Procedures: Not needed  Follow-Up: At [facility], you and your health needs are our priority.  As part of our continuing mission to provide you with exceptional heart care, we have created designated Provider Care Teams.  These Care Teams include your primary Cardiologist (physician) and Advanced Practice Providers (APPs -  Physician Assistants and Nurse Practitioners) who all work together to provide you with the care you need, when you need it.  Your next appointment:   3 to 4  month(s)  The format for your next appointment:   Virtual Visit   Provider:   Glenetta Hew, MD  Other Instructions

## 2019-01-11 ENCOUNTER — Encounter: Payer: Self-pay | Admitting: Cardiology

## 2019-01-11 NOTE — Assessment & Plan Note (Addendum)
She does have an elevated calcium score of 376.  With this we want to be aggressive with lipid management.  I do not have recent lipids as they are being checked by her PCP.  She should be due to have labs checked by PCP.  Target LDL should be less than 100 and would actually shoot for less than 70 to minimize progression of disease.  She is not currently on a statin.  She is taking fish oil.  We will defer to PCP, but need to be aggressive.

## 2019-01-11 NOTE — Assessment & Plan Note (Signed)
Controlled.  Will reduce dose of beta-blocker because of fatigue.

## 2019-01-11 NOTE — Assessment & Plan Note (Signed)
Doing well on current medications.  No change.  I will think reducing her Toprol dose will change the blood pressure that much.

## 2019-01-11 NOTE — Assessment & Plan Note (Signed)
Negative ischemic evaluation.  Borderline burden.  Seems to be well controlled on current dose of beta-blocker.  With fatigue we will see if we can potentially back down on her dose of Toprol to half tablet and for her to take it at dinnertime as opposed to bedtime.

## 2019-01-11 NOTE — Assessment & Plan Note (Signed)
Better controlled since going from amlodipine to beta-blocker.  Should be on tolerate reducing dose.

## 2019-03-09 ENCOUNTER — Encounter: Payer: Self-pay | Admitting: Physician Assistant

## 2019-04-09 ENCOUNTER — Encounter: Payer: Self-pay | Admitting: Cardiology

## 2019-04-09 ENCOUNTER — Telehealth (INDEPENDENT_AMBULATORY_CARE_PROVIDER_SITE_OTHER): Payer: Medicare Other | Admitting: Cardiology

## 2019-04-09 ENCOUNTER — Telehealth: Payer: Self-pay | Admitting: *Deleted

## 2019-04-09 VITALS — BP 132/76 | HR 56 | Ht 61.0 in | Wt 184.0 lb

## 2019-04-09 DIAGNOSIS — R5382 Chronic fatigue, unspecified: Secondary | ICD-10-CM

## 2019-04-09 DIAGNOSIS — R002 Palpitations: Secondary | ICD-10-CM

## 2019-04-09 DIAGNOSIS — I493 Ventricular premature depolarization: Secondary | ICD-10-CM | POA: Diagnosis not present

## 2019-04-09 DIAGNOSIS — I10 Essential (primary) hypertension: Secondary | ICD-10-CM

## 2019-04-09 DIAGNOSIS — R931 Abnormal findings on diagnostic imaging of heart and coronary circulation: Secondary | ICD-10-CM

## 2019-04-09 DIAGNOSIS — Z7189 Other specified counseling: Secondary | ICD-10-CM

## 2019-04-09 DIAGNOSIS — F32A Depression, unspecified: Secondary | ICD-10-CM | POA: Insufficient documentation

## 2019-04-09 DIAGNOSIS — I491 Atrial premature depolarization: Secondary | ICD-10-CM

## 2019-04-09 NOTE — Patient Instructions (Addendum)
Medication Instructions:    I to see how you do off of the metoprolol/Toprol  Take every other day for 8 days and then stop  If you all of a sudden have significant amount of palpitations again after stopping, then go back on it every other day  If the palpitations do not recur, then the plan will be to use it as needed for palpitations   Once you have stopped it, if the palpitations return go back on the Toprol for 3 to 4 days until they resolve and then you can stop.  If you have taking it for more than 5 days, then you need to wean off by taking it every other day for 3-4 doses.   *If you need a refill on your cardiac medications before your next appointment, please call your pharmacy*   Lab Work: n/a   Testing/Procedures: N/a - see below   Follow-Up: At [facility], you and your health needs are our priority.  As part of our continuing mission to provide you with exceptional heart care, we have created designated Provider Care Teams.  These Care Teams include your primary Cardiologist (physician) and Advanced Practice Providers (APPs -  Physician Assistants and Nurse Practitioners) who all work together to provide you with the care you need, when you need it.  Your next appointment:   4 month(s)  The format for your next appointment:   Either In Person or Virtual  Provider:   Bryan Lemma, MD   Other Instructions  I would like to see what your heart rate does in response to walking.  Tried to download an application for your smart phone that will allow you to check your heart rate.  These are usually free applications.  When you walk for instance into the grocery store stop for second and a half check your heart rate.  I like to see if your heart rate gets up above 100 -110 beats a minute.

## 2019-04-09 NOTE — Telephone Encounter (Signed)
RN spoke to patient. Instruction were given  from today's virtual visit 04/09/19 .  AVS SUMMARY has been sent by mychart .   Patient verbalized understanding 

## 2019-04-09 NOTE — Progress Notes (Signed)
Virtual Visit via Telephone Note   This visit type was conducted due to national recommendations for restrictions regarding the COVID-19 Pandemic (e.g. social distancing) in an effort to limit this patient's exposure and mitigate transmission in our community.  Due to her co-morbid illnesses, this patient is at least at moderate risk for complications without adequate follow up.  This format is felt to be most appropriate for this patient at this time.  The patient did not have access to video technology/had technical difficulties with video requiring transitioning to audio format only (telephone).  All issues noted in this document were discussed and addressed.  No physical exam could be performed with this format.  Please refer to the patient's chart for her  consent to telehealth for [facility].   Patient has given verbal permission to conduct this visit via virtual appointment and to bill insurance 04/12/2019 10:31 PM     Evaluation Performed:  Follow-up visit  Date:  04/12/2019   ID:  Kiara Miller, DOB Dec 07, 1950, MRN [id number]  Patient Location: Home Provider Location: Home  PCP:  Tracey Harries, MD  Cardiologist:  No primary care provider on file. Bryan Lemma, MD  Electrophysiologist:  None   Chief Complaint:   Chief Complaint  Patient presents with  . 3 month     knee surgery in July 2020 - since gained about 20 lbs ,     History of Present Illness:    Kiara Miller is a 69 y.o. female with PMH notable for PACs and PVCs who presents via audio/video conferencing for a telehealth visit today.  Kiara Miller was last seen in December 2020.  She had indicated that she had a relatively rough year, knee surgery with deconditioning and sedentary life with significant weight gain.  Also had a bout of Hives & vertigo.  Had noted fatigue and weakness since.  Knees and hips bothering her. -> Right knee stiff  Was noted to have coronary calcium score of 376.  Plan to treat  lipids and started Toprol, but at reduced dose.  Noted negative ischemic evaluation  Hospitalizations:  . n/a   Recent - Interim CV studies:   The following studies were reviewed today: . n/a:  Inerval History   Kiara Miller today stating that her heart rates are still remaining in the 50s and her palpitations seem to pretty much resolved.  She still feels fatigue with exercise intolerance, but is usually limited by pain in hips and knees.  She says she can go about 15 to 20 minutes and then she is feels like fatigue under energy level just gives out on her.  She has been gaining weight because he has not been able to exercise since her knee surgery.  Cardiovascular ROS: positive for - dyspnea on exertion and More exercise intolerance, but limited more by musculoskeletal pain and fatigue than dyspnea. negative for - chest pain, edema, orthopnea, paroxysmal nocturnal dyspnea, rapid heart rate, shortness of breath or Much less prominent palpitations.  No prolonged fast heart rate spells just a few skipped beats.  Notes resting heart rate in the 50s.   ROS:  Please see the history of present illness.    The patient does not have symptoms concerning for COVID-19 infection (fever, chills, cough, or new shortness of breath).  Review of Systems  Constitutional: Positive for malaise/fatigue. Negative for weight loss (Gain).  HENT: Negative for congestion and nosebleeds.   Respiratory: Negative for cough.   Gastrointestinal: Negative  for blood in stool and melena.  Genitourinary: Negative for hematuria.  Musculoskeletal: Positive for back pain and joint pain (Knees, hips). Negative for falls.       Right hip and back  Neurological: Positive for dizziness (Rare).  Endo/Heme/Allergies: Negative for environmental allergies. Does not bruise/bleed easily.  Psychiatric/Behavioral: Negative for depression and memory loss. The patient is not nervous/anxious and does not have insomnia.     The  patient is practicing social distancing. Has had Both COVID Vaccine shots.   Past Medical History:  Diagnosis Date  . Diverticulitis April 2017  . Hyperlipidemia   . Hypertension   . Joint pain   . Polyarthralgia   . Right knee pain    Past Surgical History:  Procedure Laterality Date  . ABDOMINAL HYSTERECTOMY    . CARPAL TUNNEL RELEASE    . MANDIBLE SURGERY    . NM MYOVIEW LTD  06/2017   (Ordered for coronary calcium score 376) LOW RISK.  EF 72%.  No ischemia or infarction.  No wall motion normality.  No change from previous.  . OSTEOTOMY PELVIS BILATERAL    . TONSILLECTOMY    . TONSILLECTOMY    . 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 TRANSTHORACIC ECHOCARDIOGRAM  12/2010   EF 65-70%. Mild LVH. No RWMA/ Gr1 DD.  . TUBAL LIGATION    . WRIST FUSION       Current Meds  Medication Sig  . ketoconazole (NIZORAL) 2 % cream Apply 1 application topically daily as needed for irritation.  Marland Kitchen levothyroxine (SYNTHROID, LEVOTHROID) 75 MCG tablet Take 75 mcg by mouth daily before breakfast.  . losartan-hydrochlorothiazide (HYZAAR) 100-25 MG per tablet Take 1 tablet by mouth daily.  . metoprolol succinate (TOPROL-XL) 12.5 mg TB24 24 hr tablet Take 12.5 mg by mouth daily. Take 12.5 mg daily  . montelukast (SINGULAIR) 10 MG tablet Take 10 mg by mouth at bedtime.    . Omega-3 Fatty Acids (FISH OIL) 1200 MG CAPS Take 1,200 mg by mouth 2 (two) times daily.    . Vitamin D, Ergocalciferol, (DRISDOL) 50000 UNITS CAPS capsule Take 50,000 Units by mouth every Wednesday.      Allergies:   Clindamycin/lincomycin, Codeine, Leflunomide, Penicillins, Statins, Tetracyclines & related, and Levofloxacin   Social History   Tobacco Use  . Smoking status: Former Smoker    Packs/day: 0.50    Years: 48.00    Pack years: 24.00    Types: Cigarettes    Quit date:  08/18/2018    Years since quitting: 0.6  . Smokeless tobacco: Never Used  Substance Use Topics  . Alcohol use: No  . Drug use: No     Family Hx: The patient's family history includes Hypertension in her father; Lung disease in her brother.   Labs/Other Tests and Data Reviewed:    EKG:  No ECG reviewed.  Recent Labs: 09/21/2018: ALT 20; BUN 26; Creatinine, Ser 0.75; Hemoglobin 13.7; Platelets 214; Potassium 3.7; Sodium 140   Recent Lipid Panel No results found for: CHOL, TRIG, HDL, CHOLHDL, LDLCALC, LDLDIRECT  Wt Readings from Last 3 Encounters:  04/09/19 184 lb (83.5 kg)  01/08/19 181 lb (82.1 kg)  12/24/18 181 lb 3.2 oz (82.2 kg)     Objective:    Vital Signs:  BP 132/76   Pulse (!) 56   Ht 5\' 1"  (1.549 m)  Wt 184 lb (83.5 kg)   BMI 34.77 kg/m   VITAL SIGNS:  reviewed GEN:  no acute distress RESPIRATORY:  Nonlabored respirations A&O x 3.  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  Have not seen lipid results recently.  They are reportedly being checked by PCP. Target LDL should be less than 100. Borderline indication for aspirin, however she is taking NSAIDs because of arthritis pains, would prefer to avoid aspirin plus NSAIDs. Not currently on statin, will defer to PCP, she asked about fish oil which is a reasonable alternative.  Would like to avoid medications, especially ones that would potentially exacerbate arthritis symptoms.      Relevant Medications   metoprolol succinate (TOPROL-XL) 12.5 mg TB24 24 hr tablet   Essential hypertension (Chronic)    Blood pressures well controlled.  Working to try to wean her off the Toprol.  Would then need to see how she does without any medications on board at home think Toprol is  affecting her blood pressure all that much.  But need to follow-up with PCP.      Relevant Medications   metoprolol succinate (TOPROL-XL) 12.5 mg TB24 24 hr tablet   Palpitations (Chronic)   PVC's (premature ventricular contractions) (Chronic)    She had a negative ischemic evaluation but does have borderline burden on coronary calcium score.  Was on the beta-blocker for PVCs and PACs, but seems to be having more fatigue.  Would like to see how she does fully off the Toprol as far as energy level.  Use Toprol more as needed dosing.      Relevant Medications   metoprolol succinate (TOPROL-XL) 12.5 mg TB24 24 hr tablet   PAC (premature atrial contraction) (Chronic)    We switched her from amlodipine to beta-blocker and these seem to have improved, but now she is having more fatigue.  Could see how she does off of beta-blocker altogether.  We reduced the dose last time.  She will then switch to using it more on a as needed basis per instructions noted below.  If her blood pressure did trend up, would probably restart potentially amlodipine, or HCTZ.      Relevant Medications   metoprolol succinate (TOPROL-XL) 12.5 mg TB24 24 hr tablet   Chronic fatigue - Primary    Would be due to have a TSH and hemoglobin levels checked by her PCP.  She is due to have labs checked soon.  Would like to see how she does weaning off the metoprolol altogether.  Instructions provided below.         COVID-19 Education: The signs and symptoms of COVID-19 were discussed with the patient and how to seek care for testing (follow up with PCP or arrange E-visit).   The importance of social distancing was discussed today.  Time:   Today, I have spent 18 minutes with the patient with telehealth technology discussing the above problems -> additional 6 minutes with charting.   Total 24 minutes   Medication Adjustments/Labs and Tests Ordered: Current medicines are reviewed at length with the patient today.   Concerns regarding medicines are outlined above.   Patient Instructions  Medication Instructions:    I to see how you do off of the metoprolol/Toprol  Take every other day for 8 days and then stop  If you all of a sudden have significant amount of palpitations again after stopping, then go back on it every other day  If the palpitations do not recur, then the plan will be to use it as needed for palpitations   Once you have stopped it, if the palpitations return go back on the Toprol for 3 to 4 days until they resolve and then you can stop.  If you have taking it for more than 5 days, then you need to wean off by taking it every other day for 3-4 doses.   *If you need a refill on your cardiac medications before your next appointment, please call your pharmacy*   Lab Work: n/a   Testing/Procedures: N/a - see below   Follow-Up: At [facility], you and your health needs are our priority.  As part of our continuing mission to provide you with exceptional heart care, we have created designated Provider Care Teams.  These Care Teams include your primary Cardiologist (physician) and Advanced Practice Providers (APPs -  Physician Assistants and Nurse Practitioners) who all work together to provide you with the care you need, when you need it.  Your next appointment:   4 month(s)  The format for your next appointment:   Either In Person or Virtual  Provider:   Bryan Lemma, MD   Other Instructions  I would like to see what your heart rate does in response to walking.  Tried to download an application for your smart phone that will allow you to check your heart rate.  These are usually free applications.  When you walk for instance into the grocery store stop for second and a half check your heart rate.  I like to see if your heart rate gets up above 100 -110 beats a minute.     Signed, Bryan Lemma, MD  04/12/2019 10:31 PM    [facility]

## 2019-04-12 ENCOUNTER — Encounter: Payer: Self-pay | Admitting: Cardiology

## 2019-04-12 NOTE — Assessment & Plan Note (Signed)
We switched her from amlodipine to beta-blocker and these seem to have improved, but now she is having more fatigue.  Could see how she does off of beta-blocker altogether.  We reduced the dose last time.  She will then switch to using it more on a as needed basis per instructions noted below.  If her blood pressure did trend up, would probably restart potentially amlodipine, or HCTZ.

## 2019-04-12 NOTE — Assessment & Plan Note (Signed)
Blood pressures well controlled.  Working to try to wean her off the Toprol.  Would then need to see how she does without any medications on board at home think Toprol is affecting her blood pressure all that much.  But need to follow-up with PCP.

## 2019-04-12 NOTE — Assessment & Plan Note (Signed)
She had a negative ischemic evaluation but does have borderline burden on coronary calcium score.  Was on the beta-blocker for PVCs and PACs, but seems to be having more fatigue.  Would like to see how she does fully off the Toprol as far as energy level.  Use Toprol more as needed dosing.

## 2019-04-12 NOTE — Assessment & Plan Note (Signed)
Definitely has an elevated calcium score therefore we are shooting for aggressive lipid control.  Have not seen lipid results recently.  They are reportedly being checked by PCP. Target LDL should be less than 100. Borderline indication for aspirin, however she is taking NSAIDs because of arthritis pains, would prefer to avoid aspirin plus NSAIDs. Not currently on statin, will defer to PCP, she asked about fish oil which is a reasonable alternative.  Would like to avoid medications, especially ones that would potentially exacerbate arthritis symptoms.

## 2019-04-12 NOTE — Assessment & Plan Note (Signed)
Would be due to have a TSH and hemoglobin levels checked by her PCP.  She is due to have labs checked soon.  Would like to see how she does weaning off the metoprolol altogether.  Instructions provided below.

## 2019-05-18 NOTE — Progress Notes (Signed)
Office Visit Note  Patient: Kiara Miller             Date of Birth: [birth year]           MRN: [id number]             PCP: Bernerd Limbo, MD Referring: Bernerd Limbo, MD Visit Date: 05/26/2019 Occupation: @GUAROCC @  Subjective:  Pain in multiple joints   History of Present Illness: Kiara Miller is a 69 y.o. female with history of seronegative rheumatoid arthritis.  She discontinued IV orencia in 2018.  She presents today with pain in multiple joints including both shoulder joints, both wrists, both hands, right knee joint, and left ankle joint.  She states that she has been noticing intermittent swelling in both wrist joints.  She is also been having difficulty bearing weight on her left ankle at times due to the pain and inflammation.  She states that she has had a cortisone injection performed by Dr. Sharol Given in the past which provided temporary pain relief.  She states that she has been taking ibuprofen 800 mg twice daily as needed for pain relief.  She is also been having increased lower back pain and experiences intermittent numbness in bilateral lower extremities.  She states that the pain is worse when standing for prolonged periods of time while cooking.  She states that she continues have chronic pain in the left knee which is replaced.  She is due to follow-up with Dr. Alvan Dame in July 2021.   Activities of Daily Living:  Patient reports joint stiffness all day  Patient Denies nocturnal pain.  Difficulty dressing/grooming: Reports Difficulty climbing stairs: Reports Difficulty getting out of chair: Reports Difficulty using hands for taps, buttons, cutlery, and/or writing: Reports  Review of Systems  Constitutional: Positive for fatigue.  HENT: Positive for mouth dryness. Negative for mouth sores and nose dryness.   Eyes: Positive for redness. Negative for pain, visual disturbance and dryness.  Respiratory: Positive for shortness of breath. Negative for cough, hemoptysis and  difficulty breathing.   Cardiovascular: Negative for chest pain, palpitations, hypertension and swelling in legs/feet.  Gastrointestinal: Negative for blood in stool, constipation and diarrhea.  Endocrine: Negative for increased urination.  Genitourinary: Negative for difficulty urinating and painful urination.  Musculoskeletal: Positive for arthralgias, joint pain, joint swelling, muscle weakness, morning stiffness and muscle tenderness. Negative for myalgias and myalgias.  Skin: Negative for color change, pallor, rash, hair loss, nodules/bumps, skin tightness, ulcers and sensitivity to sunlight.  Allergic/Immunologic: Negative for susceptible to infections.  Neurological: Negative for dizziness and headaches.  Hematological: Negative for swollen glands.  Psychiatric/Behavioral: Positive for sleep disturbance. Negative for depressed mood. The patient is not nervous/anxious.     PMFS History:  Patient Active Problem List   Diagnosis Date Noted  . Chronic fatigue 04/09/2019  . Obese 09/03/2018  . 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 PAC (premature atrial contraction) 07/11/2017  . History of hypothyroidism 06/11/2017  . Essential hypertension 06/11/2017  . History of obesity 06/11/2017  . S/P right knee arthroscopy 06/11/2017  . DDD (degenerative disc disease), cervical 06/11/2017  . DDD (degenerative disc disease), lumbar 06/11/2017  . History of carpal tunnel surgery of right wrist 06/11/2017  . Primary osteoarthritis of both knees 06/11/2017  . Chest pain, precordial 05/05/2017  . PVC's (premature ventricular contractions) 05/03/2017  . 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 Rheumatoid arthritis of multiple sites without rheumatoid factor ([place]) 10/10/2015  . Uveitis 10/10/2015  .  Diverticulitis 10/10/2015  . Dyspnea 12/05/2010  . Palpitations 12/05/2010    Past Medical History:  Diagnosis Date  . Diverticulitis April 2017  . Hyperlipidemia   .  Hypertension   . Joint pain   . Osteoarthritis   . Polyarthralgia   . Rheumatoid arthritis (HCC)   . Right knee pain     Family History  Problem Relation Age of Onset  . Hypertension Father   . Lung disease Brother    Past Surgical History:  Procedure Laterality Date  . ABDOMINAL HYSTERECTOMY    . CARPAL TUNNEL RELEASE    . KNEE ARTHROPLASTY    . MANDIBLE SURGERY    . NM MYOVIEW LTD  06/2017   (Ordered for coronary calcium score 376) LOW RISK.  EF 72%.  No ischemia or infarction.  No wall motion normality.  No change from previous.  . OSTEOTOMY PELVIS BILATERAL    . TONSILLECTOMY    . TONSILLECTOMY    . TOTAL KNEE ARTHROPLASTY Right 09/02/2018   Procedure: TOTAL KNEE ARTHROPLASTY;  Surgeon: Durene Romans, MD;  Location: [facility];  Service: Orthopedics;  Laterality: Right;  70 mins  . TRANSTHORACIC ECHOCARDIOGRAM  12/2010   EF 65-70%. Mild LVH. No RWMA/ Gr1 DD.  . TUBAL LIGATION    . WRIST FUSION     Social History   Social History Narrative  . Not on file   Immunization History  Administered Date(s) Administered  . Pneumococcal Polysaccharide-23 07/07/2012     Objective: Vital Signs: BP 127/68 (BP Location: Left Arm, Patient Position: Sitting, Cuff Size: Normal)   Pulse (!) 54   Resp 18   Ht 5' 1.5" (1.562 m)   Wt 189 lb 9.6 oz (86 kg)   BMI 35.24 kg/m    Physical Exam Vitals and nursing note reviewed.  Constitutional:      Appearance: She is well-developed.  HENT:     Head: Normocephalic and atraumatic.  Eyes:     Conjunctiva/sclera: Conjunctivae normal.  Pulmonary:     Effort: Pulmonary effort is normal.  Abdominal:     General: Bowel sounds are normal.     Palpations: Abdomen is soft.  Musculoskeletal:     Cervical back: Normal range of motion.  Lymphadenopathy:     Cervical: No cervical adenopathy.  Skin:    General: Skin is warm and dry.     Capillary Refill: Capillary refill takes less than 2 seconds.  Neurological:     Mental Status: She is  alert and oriented to person, place, and time.  Psychiatric:        Behavior: Behavior normal.      Musculoskeletal Exam: C-spine limited range of motion with discomfort.  Postural thoracic episodes noted.  Limited range of motion of the lumbar spine.  Midline spinal tenderness in lumbar region.  Shoulder joints have painful range of motion bilaterally.  Elbow joints have good range of motion with no discomfort.  Right wrist has full range of motion with tenderness and inflammation.  Left wrist is fused and has tenderness and mild inflammation on the ulnar aspect.  MCPs, PIPs, DIPs good range of motion with no synovitis.  She has tenderness of the right second MCP joint.  PIP and DIP thickening consistent with osteoarthritis of both hands.  Hip joints have good range of motion no discomfort.  Right knee replacement has good range of motion with no warmth or effusion.  Left knee joint has good range of motion no warmth or effusion.  She has tenderness, inflammation, and subluxation of the left ankle joint.  CDAI Exam: CDAI Score: 10.4  Patient Global: 8 mm; Provider Global: 6 mm Swollen: 5 ; Tender: 6  Joint Exam 05/26/2019      Right  Left  Wrist  Swollen Tender  Swollen Tender  MCP 1  Swollen Tender  Swollen Tender  MCP 2   Tender     Ankle     Swollen Tender     Investigation: No additional findings.  Imaging: No results found.  Recent Labs: Lab Results  Component Value Date   WBC 10.9 (H) 09/21/2018   HGB 13.7 09/21/2018   PLT 214 09/21/2018   NA 140 09/21/2018   K 3.7 09/21/2018   CL 104 09/21/2018   CO2 26 09/21/2018   GLUCOSE 115 (H) 09/21/2018   BUN 26 (H) 09/21/2018   CREATININE 0.75 09/21/2018   BILITOT 0.9 09/21/2018   ALKPHOS 50 09/21/2018   AST 21 09/21/2018   ALT 20 09/21/2018   PROT 7.3 09/21/2018   ALBUMIN 4.1 09/21/2018   CALCIUM 9.4 09/21/2018   GFRAA >60 09/21/2018   QFTBGOLD Negative 08/30/2016    Speciality Comments: Orencia IV 750mg  every 4  weeks, TB gold negative 08/30/16  Procedures:  No procedures performed Allergies: Clindamycin/lincomycin, Codeine, Leflunomide, Penicillins, Statins, Tetracyclines & related, and Levofloxacin   Assessment / Plan:     Visit Diagnoses: Rheumatoid arthritis of multiple sites without rheumatoid factor (HCC) - 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  She has been experiencing increased pain in both shoulder joints, both wrists, both hands, and left ankle joint.  She has painful range of motion of both shoulder joints on exam.  She has tenderness inflammation of both wrist joints.  She has subluxation, tenderness, and warmth in the left ankle joint on exam.  She is not currently taking any immunosuppressive agents.  She discontinued IV Orencia infusions in September 2018.  She had an ultrasound of both hands performed in November 2019 which revealed active synovitis in bilateral wrist joints and the right second MCP joint.  At that time she was encouraged to start on Plaquenil but she declined due to the COVID-19 pandemic.  Indications, contraindications, potential side effects of Plaquenil were discussed today.  All questions were addressed and consent was obtained.  She will start taking Plaquenil as prescribed pending lab results.  She was advised to notify December 2019 if she cannot tolerate taking Plaquenil.  She will return for lab work in 1 month in 3 months and every 5 months.  Standing orders are in place.  She will follow-up in the office in 6 weeks.  Patient was counseled on the purpose, proper use, and adverse effects of hydroxychloroquine including nausea/diarrhea, skin rash, headaches, and sun sensitivity.  Discussed importance of annual eye exams while on hydroxychloroquine to monitor to ocular toxicity and discussed importance of frequent laboratory monitoring.  Provided patient with eye exam form for baseline  ophthalmologic exam.  Provided patient with educational materials on hydroxychloroquine and answered all questions.  Patient consented to hydroxychloroquine.  Will upload consent in the media tab.    Dose will be Plaquenil 200 mg twice daily Monday through Friday.  Prescription pending lab results.  High risk medication use - She will be starting on Plaquenil pending lab work.  She previously discontinued Orencia IV infusions and 10/2016. (patient tried sulfasalazine-nausea, methotrexate-fatigue, Arava-epigastric pain, inadequate response to Humira 2015,  Enbrel 2016).  CBC and CMP will be drawn today. She will return for lab work in 1 month, 3 months, and every 5 months to monitor for drug toxicity.  Standing orders are in place.- Plan: CBC with Differential/Platelet, COMPLETE METABOLIC PANEL WITH GFR  Status post fusion of wrist (left): She has limited range of motion with discomfort.  Tenderness inflammation noted on the ulnar aspect of the left wrist.  Primary osteoarthritis of left knee: She has good range of motion of the left knee joint on exam.  No warmth or effusion was noted.  S/P total knee arthroplasty, right: Chronic pain.  She has good ROM on exam.  No warmth or effusion noted.  She will be following up with Dr. Charlann Boxer in July 2021.   DDD (degenerative disc disease), cervical:  She has limited ROM with lateral rotation.  She has no symptoms of radiculopathy at this time.   DDD (degenerative disc disease), lumbar: Chronic pain   Other medical conditions are listed as follows:   Coccyx pain  History of carpal tunnel surgery of right wrist  History of hypertension  History of diverticulitis  History of hypothyroidism  Smoker  Orders: Orders Placed This Encounter  Procedures  . CBC with Differential/Platelet  . COMPLETE METABOLIC PANEL WITH GFR   No orders of the defined types were placed in this encounter.   Face-to-face time spent with patient was 30 minutes. Greater  than 50% of time was spent in counseling and coordination of care.  Follow-Up Instructions: Return in about 6 weeks (around 07/07/2019) for Rheumatoid arthritis, Osteoarthritis.   Gearldine Bienenstock, PA-C  Note - This record has been created using Dragon software.  Chart creation errors have been sought, but may not always  have been located. Such creation errors do not reflect on  the standard of medical care.

## 2019-05-26 ENCOUNTER — Encounter: Payer: Self-pay | Admitting: Physician Assistant

## 2019-05-26 ENCOUNTER — Telehealth: Payer: Self-pay

## 2019-05-26 ENCOUNTER — Ambulatory Visit (INDEPENDENT_AMBULATORY_CARE_PROVIDER_SITE_OTHER): Payer: Medicare Other | Admitting: Physician Assistant

## 2019-05-26 ENCOUNTER — Other Ambulatory Visit: Payer: Self-pay

## 2019-05-26 VITALS — BP 127/68 | HR 54 | Resp 18 | Ht 61.5 in | Wt 189.6 lb

## 2019-05-26 DIAGNOSIS — Z8719 Personal history of other diseases of the digestive system: Secondary | ICD-10-CM

## 2019-05-26 DIAGNOSIS — Z96651 Presence of right artificial knee joint: Secondary | ICD-10-CM

## 2019-05-26 DIAGNOSIS — Z8679 Personal history of other diseases of the circulatory system: Secondary | ICD-10-CM

## 2019-05-26 DIAGNOSIS — Z981 Arthrodesis status: Secondary | ICD-10-CM

## 2019-05-26 DIAGNOSIS — M5030 Other cervical disc degeneration, unspecified cervical region: Secondary | ICD-10-CM

## 2019-05-26 DIAGNOSIS — Z9889 Other specified postprocedural states: Secondary | ICD-10-CM

## 2019-05-26 DIAGNOSIS — Z79899 Other long term (current) drug therapy: Secondary | ICD-10-CM

## 2019-05-26 DIAGNOSIS — F17200 Nicotine dependence, unspecified, uncomplicated: Secondary | ICD-10-CM

## 2019-05-26 DIAGNOSIS — M0609 Rheumatoid arthritis without rheumatoid factor, multiple sites: Secondary | ICD-10-CM | POA: Diagnosis not present

## 2019-05-26 DIAGNOSIS — Z8639 Personal history of other endocrine, nutritional and metabolic disease: Secondary | ICD-10-CM

## 2019-05-26 DIAGNOSIS — M1712 Unilateral primary osteoarthritis, left knee: Secondary | ICD-10-CM

## 2019-05-26 DIAGNOSIS — M5136 Other intervertebral disc degeneration, lumbar region: Secondary | ICD-10-CM

## 2019-05-26 DIAGNOSIS — M533 Sacrococcygeal disorders, not elsewhere classified: Secondary | ICD-10-CM

## 2019-05-26 LAB — CBC WITH DIFFERENTIAL/PLATELET
Absolute Monocytes: 447 cells/uL (ref 200–950)
Basophils Absolute: 31 cells/uL (ref 0–200)
Basophils Relative: 0.6 %
Eosinophils Absolute: 151 cells/uL (ref 15–500)
Eosinophils Relative: 2.9 %
HCT: 44.9 % (ref 35.0–45.0)
Hemoglobin: 14.8 g/dL (ref 11.7–15.5)
Lymphs Abs: 2007 cells/uL (ref 850–3900)
MCH: 29.4 pg (ref 27.0–33.0)
MCHC: 33 g/dL (ref 32.0–36.0)
MCV: 89.3 fL (ref 80.0–100.0)
MPV: 11.3 fL (ref 7.5–12.5)
Monocytes Relative: 8.6 %
Neutro Abs: 2564 cells/uL (ref 1500–7800)
Neutrophils Relative %: 49.3 %
Platelets: 179 10*3/uL (ref 140–400)
RBC: 5.03 10*6/uL (ref 3.80–5.10)
RDW: 13.4 % (ref 11.0–15.0)
Total Lymphocyte: 38.6 %
WBC: 5.2 10*3/uL (ref 3.8–10.8)

## 2019-05-26 LAB — COMPLETE METABOLIC PANEL WITH GFR
AG Ratio: 1.6 (calc) (ref 1.0–2.5)
ALT: 17 U/L (ref 6–29)
AST: 17 U/L (ref 10–35)
Albumin: 4.2 g/dL (ref 3.6–5.1)
Alkaline phosphatase (APISO): 49 U/L (ref 37–153)
BUN/Creatinine Ratio: 33 (calc) — ABNORMAL HIGH (ref 6–22)
BUN: 27 mg/dL — ABNORMAL HIGH (ref 7–25)
CO2: 27 mmol/L (ref 20–32)
Calcium: 10 mg/dL (ref 8.6–10.4)
Chloride: 102 mmol/L (ref 98–110)
Creat: 0.81 mg/dL (ref 0.50–0.99)
GFR, Est African American: 86 mL/min/{1.73_m2} (ref >=60)
GFR, Est Non African American: 74 mL/min/{1.73_m2} (ref >=60)
Globulin: 2.7 g/dL (calc) (ref 1.9–3.7)
Glucose, Bld: 75 mg/dL (ref 65–99)
Potassium: 4.2 mmol/L (ref 3.5–5.3)
Sodium: 138 mmol/L (ref 135–146)
Total Bilirubin: 0.7 mg/dL (ref 0.2–1.2)
Total Protein: 6.9 g/dL (ref 6.1–8.1)

## 2019-05-26 NOTE — Patient Instructions (Addendum)
Standing Labs We placed an order today for your standing lab work.    Please come back and get your standing labs in 1 month, 3 months, then every 5 months   We have open lab daily Monday through Thursday from 8:30-12:30 PM and 1:30-4:30 PM and Friday from 8:30-12:30 PM and 1:30-4:00 PM at the office of Dr. Shaili Deveshwar.   You may experience shorter wait times on Monday and Friday afternoons. The office is located at [address], [address], [place], [place] [address] No appointment is necessary.   Labs are drawn by [facility].  You may receive a bill from [facility] for your lab work.  If you wish to have your labs drawn at another location, please call the office 24 hours in advance to send orders.  If you have any questions regarding directions or hours of operation,  please call [phone].   Just as a reminder please drink plenty of water prior to coming for your lab work. Thanks!   Hydroxychloroquine tablets What is this medicine? HYDROXYCHLOROQUINE (hye drox ee KLOR oh kwin) is used to treat rheumatoid arthritis and systemic lupus erythematosus. It is also used to treat malaria. This medicine may be used for other purposes; ask your health care provider or pharmacist if you have questions. COMMON BRAND NAME(S): Plaquenil, Quineprox What should I tell my health care provider before I take this medicine? They need to know if you have any of these conditions:  diabetes  eye disease, vision problems  G6PD deficiency  heart disease  history of irregular heartbeat  if you often drink alcohol  kidney disease  liver disease  porphyria  psoriasis  an unusual or allergic reaction to chloroquine, hydroxychloroquine, other medicines, foods, dyes, or preservatives  pregnant or trying to get pregnant  breast-feeding How should I use this medicine? Take this medicine by mouth with a glass of water. Follow the directions on the prescription label. Do not cut, crush or  chew this medicine. Swallow the tablets whole. Take this medicine with food. Avoid taking antacids within 4 hours of taking this medicine. It is best to separate these medicines by at least 4 hours. Take your medicine at regular intervals. Do not take it more often than directed. Take all of your medicine as directed even if you think you are better. Do not skip doses or stop your medicine early. Talk to your pediatrician regarding the use of this medicine in children. While this drug may be prescribed for selected conditions, precautions do apply. Overdosage: If you think you have taken too much of this medicine contact a poison control center or emergency room at once. NOTE: This medicine is only for you. Do not share this medicine with others. What if I miss a dose? If you miss a dose, take it as soon as you can. If it is almost time for your next dose, take only that dose. Do not take double or extra doses. What may interact with this medicine? Do not take this medicine with any of the following medications:  cisapride  dronedarone  pimozide  thioridazine This medicine may also interact with the following medications:  ampicillin  antacids  cimetidine  cyclosporine  digoxin  kaolin  medicines for diabetes, like insulin, glipizide, glyburide  medicines for seizures like carbamazepine, phenobarbital, phenytoin  mefloquine  methotrexate  other medicines that prolong the QT interval (cause an abnormal heart rhythm)  praziquantel This list may not describe all possible interactions. Give your health care provider   a list of all the medicines, herbs, non-prescription drugs, or dietary supplements you use. Also tell them if you smoke, drink alcohol, or use illegal drugs. Some items may interact with your medicine. What should I watch for while using this medicine? Visit your health care professional for regular checks on your progress. Tell your health care professional if your  symptoms do not start to get better or if they get worse. You may need blood work done while you are taking this medicine. If you take other medicines that can affect heart rhythm, you may need more testing. Talk to your health care professional if you have questions. Your vision may be tested before and during use of this medicine. Tell your health care professional right away if you have any change in your eyesight. What side effects may I notice from receiving this medicine? Side effects that you should report to your doctor or health care professional as soon as possible:  allergic reactions like skin rash, itching or hives, swelling of the face, lips, or tongue  changes in vision  decreased hearing or ringing of the ears  muscle weakness  redness, blistering, peeling or loosening of the skin, including inside the mouth  sensitivity to light  signs and symptoms of a dangerous change in heartbeat or heart rhythm like chest pain; dizziness; fast or irregular heartbeat; palpitations; feeling faint or lightheaded, falls; breathing problems  signs and symptoms of liver injury like dark yellow or brown urine; general ill feeling or flu-like symptoms; light-colored stools; loss of appetite; nausea; right upper belly pain; unusually weak or tired; yellowing of the eyes or skin  signs and symptoms of low blood sugar such as feeling anxious; confusion; dizziness; increased hunger; unusually weak or tired; sweating; shakiness; cold; irritable; headache; blurred vision; fast heartbeat; loss of consciousness  suicidal thoughts  uncontrollable head, mouth, neck, arm, or leg movements Side effects that usually do not require medical attention (report to your doctor or health care professional if they continue or are bothersome):  diarrhea  dizziness  hair loss  headache  irritable  loss of appetite  nausea, vomiting  stomach pain This list may not describe all possible side effects.  Call your doctor for medical advice about side effects. You may report side effects to FDA at 1-800-FDA-1088. Where should I keep my medicine? Keep out of the reach of children. Store at room temperature between 15 and 30 degrees C (59 and 86 degrees F). Protect from moisture and light. Throw away any unused medicine after the expiration date. NOTE: This sheet is a summary. It may not cover all possible information. If you have questions about this medicine, talk to your doctor, pharmacist, or health care provider.  2020 Elsevier/Gold Standard (2018-06-02 12:56:32)  

## 2019-05-26 NOTE — Telephone Encounter (Addendum)
Per Sheppard Evens, pending lab results patient will start plaquenil. Consent was obtained at office visit on 05/26/2019.    *eye exam form faxed to Dr. London Sheer per patient's request.

## 2019-05-27 ENCOUNTER — Telehealth: Payer: Self-pay | Admitting: Rheumatology

## 2019-05-27 MED ORDER — HYDROXYCHLOROQUINE SULFATE 200 MG PO TABS: ORAL_TABLET | ORAL | 0 refills | Status: DC

## 2019-05-27 NOTE — Progress Notes (Signed)
BUN elevated. Creatinine and GFR WNL.  Rest of CMP WNL.  CBC WNL.

## 2019-05-27 NOTE — Telephone Encounter (Signed)
Labs: 05/26/2019 BUN elevated. Creatinine and GFR WNL. Rest of CMP WNL. CBC WNL.  Dose: Plaquenil 200 mg twice daily Monday through Friday.  Prescription sent to [facility] pharmacy per patient's request.

## 2019-05-27 NOTE — Telephone Encounter (Signed)
Spoke with pharmacist Stu and provided ICD-10 code: M06.09.

## 2019-05-27 NOTE — Telephone Encounter (Signed)
Stu from [facility] called stating patient's insurance is requiring an ICD 10 code to refill Plaquenil.  Please return call #[phone]

## 2019-06-24 ENCOUNTER — Other Ambulatory Visit: Payer: Self-pay | Admitting: Rheumatology

## 2019-06-24 NOTE — Telephone Encounter (Addendum)
Last Visit: 05/26/2019 Next Visit: 07/07/2019 Labs: 05/26/2019 BUN elevated. Creatinine and GFR WNL. Rest of CMP WNL. CBC WNL. Eye exam: 03/09/2019   Advised Patient she is due to update labs (1 month after starting PLQ). Patient verbalized understanding and will update labs on Monday, 06/29/2019.   Okay to refill plaquenil?

## 2019-06-25 NOTE — Telephone Encounter (Signed)
Ok to refill PLQ.

## 2019-06-25 NOTE — Progress Notes (Signed)
Office Visit Note  Patient: Kiara Miller             Date of Birth: 1950/02/19           MRN: [id number]             PCP: Tracey Harries, MD Referring: Tracey Harries, MD Visit Date: 07/07/2019 Occupation: @GUAROCC @  Subjective:  Medication monitoring   History of Present Illness: Kiara Miller is a 69 y.o. female with history of seronegative rheumatoid arthritis, osteoarthritis, and DDD.  Patient is taking Plaquenil 200 mg 1 tablet by mouth twice daily Monday through Friday.  She has been taking Plaquenil for the past 6 weeks and has been tolerating it without any side effects.  She has not noticed any improvement in her joint pain or inflammation since starting on Plaquenil.  She continues to take ibuprofen as needed for pain relief.  Patient reports that for the past 3 weeks she has been having coccyx pain and has an appointment with Dr. Friday today for an injection.  She has chronic neck and lower back pain.  She continues to have discomfort in both shoulders, both wrist joints, both hands, and the left ankle joint.  Her left ankle continues to swell intermittently.    Activities of Daily Living:  Patient reports joint stiffness all day  Patient Reports nocturnal pain.  Difficulty dressing/grooming: Reports Difficulty climbing stairs: Reports Difficulty getting out of chair: Reports Difficulty using hands for taps, buttons, cutlery, and/or writing: Reports  Review of Systems  Constitutional: Positive for fatigue.  HENT: Negative for mouth sores, mouth dryness and nose dryness.   Eyes: Negative for pain, itching, visual disturbance and dryness.  Respiratory: Negative for cough, hemoptysis, shortness of breath and difficulty breathing.   Cardiovascular: Negative for chest pain, palpitations, hypertension and swelling in legs/feet.  Gastrointestinal: Negative for blood in stool, constipation and diarrhea.  Endocrine: Negative for increased urination.  Genitourinary: Negative for  difficulty urinating and painful urination.  Musculoskeletal: Positive for arthralgias, joint pain, joint swelling, myalgias, morning stiffness, muscle tenderness and myalgias. Negative for muscle weakness.  Skin: Negative for color change, pallor, rash, hair loss, nodules/bumps, redness, skin tightness, ulcers and sensitivity to sunlight.  Allergic/Immunologic: Negative for susceptible to infections.  Neurological: Positive for dizziness. Negative for headaches and memory loss.  Hematological: Negative for swollen glands.  Psychiatric/Behavioral: Negative for depressed mood, confusion and sleep disturbance. The patient is not nervous/anxious.     PMFS History:  Patient Active Problem List   Diagnosis Date Noted  . Chronic fatigue 04/09/2019  . Obese 09/03/2018  . 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 PAC (premature atrial contraction) 07/11/2017  . History of hypothyroidism 06/11/2017  . Essential hypertension 06/11/2017  . History of obesity 06/11/2017  . S/P right knee arthroscopy 06/11/2017  . DDD (degenerative disc disease), cervical 06/11/2017  . DDD (degenerative disc disease), lumbar 06/11/2017  . History of carpal tunnel surgery of right wrist 06/11/2017  . Primary osteoarthritis of both knees 06/11/2017  . Chest pain, precordial 05/05/2017  . PVC's (premature ventricular contractions) 05/03/2017  . 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 Uveitis 10/10/2015  . Diverticulitis 10/10/2015  . Dyspnea 12/05/2010  . Palpitations 12/05/2010    Past Medical History:  Diagnosis Date  . Diverticulitis April 2017  . Hyperlipidemia   . Hypertension   . Joint pain   . Osteoarthritis   .  Polyarthralgia   . Rheumatoid arthritis (HCC)   . Right knee pain     Family History  Problem Relation Age of Onset  . Hypertension Father   . Lung disease Brother    Past Surgical  History:  Procedure Laterality Date  . ABDOMINAL HYSTERECTOMY    . CARPAL TUNNEL RELEASE    . KNEE ARTHROPLASTY    . MANDIBLE SURGERY    . NM MYOVIEW LTD  06/2017   (Ordered for coronary calcium score 376) LOW RISK.  EF 72%.  No ischemia or infarction.  No wall motion normality.  No change from previous.  . OSTEOTOMY PELVIS BILATERAL    . TONSILLECTOMY    . TONSILLECTOMY    . TOTAL KNEE ARTHROPLASTY Right 09/02/2018   Procedure: TOTAL KNEE ARTHROPLASTY;  Surgeon: Durene Romans, MD;  Location: [facility];  Service: Orthopedics;  Laterality: Right;  70 mins  . TRANSTHORACIC ECHOCARDIOGRAM  12/2010   EF 65-70%. Mild LVH. No RWMA/ Gr1 DD.  . TUBAL LIGATION    . WRIST FUSION     Social History   Social History Narrative  . Not on file   Immunization History  Administered Date(s) Administered  . Pneumococcal Polysaccharide-23 07/07/2012     Objective: Vital Signs: BP 137/87 (BP Location: Right Arm, Patient Position: Sitting, Cuff Size: Normal)   Pulse (!) 58   Wt 192 lb 6.4 oz (87.3 kg)   BMI 35.76 kg/m    Physical Exam Vitals and nursing note reviewed.  Constitutional:      Appearance: She is well-developed.  HENT:     Head: Normocephalic and atraumatic.  Eyes:     Conjunctiva/sclera: Conjunctivae normal.  Pulmonary:     Effort: Pulmonary effort is normal.     Breath sounds: Normal breath sounds.  Abdominal:     General: Bowel sounds are normal.     Palpations: Abdomen is soft.  Musculoskeletal:     Cervical back: Normal range of motion.  Lymphadenopathy:     Cervical: No cervical adenopathy.  Skin:    General: Skin is warm and dry.     Capillary Refill: Capillary refill takes less than 2 seconds.  Neurological:     Mental Status: She is alert and oriented to person, place, and time.  Psychiatric:        Behavior: Behavior normal.      Musculoskeletal Exam: C-spine good ROM with discomfort.  Thoracic kyphosis noted.  Limited ROM of lumbar spine.  Shoulders have  painful ROM bilaterally.  Elbow joints good ROM with no discomfort. Left wrist is fused.  Right wrist has good ROM.  No tenderness of inflammation of wrist joints noted.  Tenderness and subluxation of bilateral 1st MCP joints.  PIP and DIP thickening consistent with osteoarthritis of both hands.  Hip joints difficult to assess due to lower back pain.  Right knee replacement has good ROM.  left knee has good ROM with no warmth or effusion. Tenderness, mild warmth, and synovial thickening of the left ankle joint noted.    CDAI Exam: CDAI Score: 10  Patient Global: 5 mm; Provider Global: 5 mm Swollen: 3 ; Tender: 8  Joint Exam 07/07/2019      Right  Left  Glenohumeral   Tender   Tender  Wrist   Tender   Tender  MCP 1  Swollen Tender  Swollen Tender  MCP 2   Tender     Ankle     Swollen Tender     Investigation:  No additional findings.  Imaging: No results found.  Recent Labs: Lab Results  Component Value Date   WBC 3.8 06/29/2019   HGB 13.9 06/29/2019   PLT 148 06/29/2019   NA 138 06/29/2019   K 4.4 06/29/2019   CL 104 06/29/2019   CO2 26 06/29/2019   GLUCOSE 88 06/29/2019   BUN 20 06/29/2019   CREATININE 0.83 06/29/2019   BILITOT 0.6 06/29/2019   ALKPHOS 50 09/21/2018   AST 19 06/29/2019   ALT 15 06/29/2019   PROT 6.7 06/29/2019   ALBUMIN 4.1 09/21/2018   CALCIUM 9.5 06/29/2019   GFRAA 83 06/29/2019   QFTBGOLD Negative 08/30/2016    Speciality Comments: Orencia IV 750mg  every 4 weeks, TB gold negative 08/30/16  PLQ Eye Exam: 03/09/2019 WNL @ [facility] Follow up in 6 months.  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  She has been experiencing discomfort in both shoulders, both wrists, both hands, and the left ankle joint.  She has tenderness warmth and synovial thickening of the left ankle joint on examination today.  Her joint stiffness has been lasting all day and she has been experiencing nocturnal pain.  She has been taking ibuprofen as needed for pain relief.  She was started on Plaquenil 200 mg 1 tablet by mouth twice daily Monday through Friday in April 2021.  She is been tolerating Plaquenil without any side effects.  She has not noticed any improvement since starting on Plaquenil.  She appears to have less inflammation in both wrist joints on examination today.  We discussed combination therapy with but she does not want to make any medication changes at this time.  She would like to give Plaquenil more time.  She will continue taking Plaquenil 200 mg 1 tablet by mouth twice daily Monday through Friday.  She was advised to notify us if her joint pain persists or worsens.  She will follow-up in the office in 2 months.  High risk medication use - PLQ 200 mg BID M-F-started in April 2021.  PLQ Eye Exam: 03/09/2019 WNL @ [facility] Follow up in 6 months.  CBC and CMP were within normal limits on 06/29/2019.  She will return for lab work in 6 weeks.  Standing orders for CBC and CMP are in place.  Status post fusion of wrist (left): She has painful and limited range of motion of the left wrist.  Tenderness to palpation noted.   Primary osteoarthritis of left knee: She has good range of motion with no discomfort at this time.  No warmth or effusion noted.  S/P total knee arthroplasty, right: Doing well.  She has good range of motion with no discomfort.  No warmth or effusion was noted.  She continues to have some difficulty when descending stairs.  We discussed the importance of lower extremity muscle strengthening.  DDD (degenerative disc disease), cervical: She has good range of motion  with discomfort on exam.  She is not experiencing any symptoms of radiculopathy currently.  DDD (degenerative disc disease), lumbar: Chronic pain.  She continues to experience intermittent right-sided radiculopathy.  She is followed by Dr. Francesco Runner.  Coccyx pain: She has been experiencing increased coccyx pain for the past  3 weeks.  She did not have any injury prior to the onset of symptoms.  She is experiences discomfort in the past and had an injection which resolved her discomfort.  She has an appointment today with Dr. Laurian Brim to discuss have an injection.  History of carpal tunnel surgery of right wrist: Asymptomatic at this time.  Other medical conditions are listed as follows:  History of hypertension  History of diverticulitis  History of hypothyroidism  Smoker  Orders: No orders of the defined types were placed in this encounter.  No orders of the defined types were placed in this encounter.    Follow-Up Instructions: Return in about 2 months (around 09/06/2019).   Gearldine Bienenstock, PA-C  Note - This record has been created using Dragon software.  Chart creation errors have been sought, but may not always  have been located. Such creation errors do not reflect on  the standard of medical care.

## 2019-06-29 ENCOUNTER — Other Ambulatory Visit: Payer: Self-pay

## 2019-06-29 DIAGNOSIS — Z79899 Other long term (current) drug therapy: Secondary | ICD-10-CM

## 2019-06-29 LAB — CBC WITH DIFFERENTIAL/PLATELET
Absolute Monocytes: 334 cells/uL (ref 200–950)
Basophils Absolute: 42 cells/uL (ref 0–200)
Basophils Relative: 1.1 %
Eosinophils Absolute: 91 cells/uL (ref 15–500)
Eosinophils Relative: 2.4 %
HCT: 41.9 % (ref 35.0–45.0)
Hemoglobin: 13.9 g/dL (ref 11.7–15.5)
Lymphs Abs: 1638 cells/uL (ref 850–3900)
MCH: 30.2 pg (ref 27.0–33.0)
MCHC: 33.2 g/dL (ref 32.0–36.0)
MCV: 90.9 fL (ref 80.0–100.0)
MPV: 11.9 fL (ref 7.5–12.5)
Monocytes Relative: 8.8 %
Neutro Abs: 1695 cells/uL (ref 1500–7800)
Neutrophils Relative %: 44.6 %
Platelets: 148 10*3/uL (ref 140–400)
RBC: 4.61 10*6/uL (ref 3.80–5.10)
RDW: 13.1 % (ref 11.0–15.0)
Total Lymphocyte: 43.1 %
WBC: 3.8 10*3/uL (ref 3.8–10.8)

## 2019-06-29 LAB — COMPLETE METABOLIC PANEL WITH GFR
AG Ratio: 1.6 (calc) (ref 1.0–2.5)
ALT: 15 U/L (ref 6–29)
AST: 19 U/L (ref 10–35)
Albumin: 4.1 g/dL (ref 3.6–5.1)
Alkaline phosphatase (APISO): 41 U/L (ref 37–153)
BUN: 20 mg/dL (ref 7–25)
CO2: 26 mmol/L (ref 20–32)
Calcium: 9.5 mg/dL (ref 8.6–10.4)
Chloride: 104 mmol/L (ref 98–110)
Creat: 0.83 mg/dL (ref 0.50–0.99)
GFR, Est African American: 83 mL/min/{1.73_m2} (ref >=60)
GFR, Est Non African American: 72 mL/min/{1.73_m2} (ref >=60)
Globulin: 2.6 g/dL (calc) (ref 1.9–3.7)
Glucose, Bld: 88 mg/dL (ref 65–99)
Potassium: 4.4 mmol/L (ref 3.5–5.3)
Sodium: 138 mmol/L (ref 135–146)
Total Bilirubin: 0.6 mg/dL (ref 0.2–1.2)
Total Protein: 6.7 g/dL (ref 6.1–8.1)

## 2019-06-30 NOTE — Progress Notes (Signed)
Labs are normal.

## 2019-07-07 ENCOUNTER — Other Ambulatory Visit: Payer: Self-pay

## 2019-07-07 ENCOUNTER — Ambulatory Visit (INDEPENDENT_AMBULATORY_CARE_PROVIDER_SITE_OTHER): Payer: Medicare Other | Admitting: Physician Assistant

## 2019-07-07 ENCOUNTER — Encounter: Payer: Self-pay | Admitting: Physician Assistant

## 2019-07-07 VITALS — BP 137/87 | HR 58 | Resp 15 | Ht 61.5 in | Wt 192.4 lb

## 2019-07-07 DIAGNOSIS — Z8639 Personal history of other endocrine, nutritional and metabolic disease: Secondary | ICD-10-CM

## 2019-07-07 DIAGNOSIS — M5136 Other intervertebral disc degeneration, lumbar region: Secondary | ICD-10-CM

## 2019-07-07 DIAGNOSIS — M533 Sacrococcygeal disorders, not elsewhere classified: Secondary | ICD-10-CM

## 2019-07-07 DIAGNOSIS — Z8719 Personal history of other diseases of the digestive system: Secondary | ICD-10-CM

## 2019-07-07 DIAGNOSIS — Z96651 Presence of right artificial knee joint: Secondary | ICD-10-CM

## 2019-07-07 DIAGNOSIS — M0609 Rheumatoid arthritis without rheumatoid factor, multiple sites: Secondary | ICD-10-CM | POA: Diagnosis not present

## 2019-07-07 DIAGNOSIS — Z981 Arthrodesis status: Secondary | ICD-10-CM | POA: Diagnosis not present

## 2019-07-07 DIAGNOSIS — M5030 Other cervical disc degeneration, unspecified cervical region: Secondary | ICD-10-CM

## 2019-07-07 DIAGNOSIS — Z79899 Other long term (current) drug therapy: Secondary | ICD-10-CM | POA: Diagnosis not present

## 2019-07-07 DIAGNOSIS — Z9889 Other specified postprocedural states: Secondary | ICD-10-CM

## 2019-07-07 DIAGNOSIS — Z8679 Personal history of other diseases of the circulatory system: Secondary | ICD-10-CM

## 2019-07-07 DIAGNOSIS — F17200 Nicotine dependence, unspecified, uncomplicated: Secondary | ICD-10-CM

## 2019-07-07 DIAGNOSIS — M1712 Unilateral primary osteoarthritis, left knee: Secondary | ICD-10-CM

## 2019-07-20 ENCOUNTER — Other Ambulatory Visit: Payer: Self-pay | Admitting: Rheumatology

## 2019-07-20 NOTE — Telephone Encounter (Signed)
Last Visit: 07/07/2019 Next Visit: 09/08/2019 Labs: 06/29/2019 Labs are normal. PLQ Eye Exam:  03/09/2019 WNL  Current Dose per office note 07/07/2019: PLQ 200 mg BID M-F  Okay to refill per Dr. Corliss Skains

## 2019-07-27 NOTE — Telephone Encounter (Signed)
Patient was previously on Orencia IV but discontinued due to Covid concerns.  She was started on Plaquenil in April.  Looking at last cardiology note from March 2021 vertigo, dizziness, fatigue, and SOB are not new issues. Plaquenil can sometimes cause dizziness and nausea but not SOB.  Cardiologist plans to "wean off" beta blocker to see if that alleviates some of her symptoms. Recommend patient follow up with PCP and cardiologist.  Then a follow up appointment to discuss medications if her symptoms are not well controlled.

## 2019-08-07 ENCOUNTER — Telehealth: Payer: Self-pay | Admitting: *Deleted

## 2019-08-07 ENCOUNTER — Encounter: Payer: Self-pay | Admitting: Cardiology

## 2019-08-07 ENCOUNTER — Telehealth (INDEPENDENT_AMBULATORY_CARE_PROVIDER_SITE_OTHER): Payer: Medicare Other | Admitting: Cardiology

## 2019-08-07 VITALS — BP 138/72 | HR 50 | Ht 61.0 in | Wt 186.0 lb

## 2019-08-07 DIAGNOSIS — Z8639 Personal history of other endocrine, nutritional and metabolic disease: Secondary | ICD-10-CM

## 2019-08-07 DIAGNOSIS — I10 Essential (primary) hypertension: Secondary | ICD-10-CM

## 2019-08-07 DIAGNOSIS — I493 Ventricular premature depolarization: Secondary | ICD-10-CM | POA: Diagnosis not present

## 2019-08-07 DIAGNOSIS — R931 Abnormal findings on diagnostic imaging of heart and coronary circulation: Secondary | ICD-10-CM | POA: Diagnosis not present

## 2019-08-07 MED ORDER — DILTIAZEM HCL ER COATED BEADS 120 MG PO CP24: 120.0000 mg | ORAL_CAPSULE | Freq: Every day | ORAL | 6 refills | Status: DC

## 2019-08-07 NOTE — Assessment & Plan Note (Signed)
Pretty well controlled currently with losartan-HCTZ plus or minus low-dose beta-blocker.  Should be okay to switch from beta-blocker to diltiazem.

## 2019-08-07 NOTE — Patient Instructions (Addendum)
Medication Instructions:   Lets try to convert from Toprol to diltiazem XT.  New prescription will be diltiazem XT 120 mg as directed->  For 1 week, take the Toprol every other day alternating with the new diltiazem dose.  After that week then stop Toprol and take diltiazem every day until seen in follow-up scheduled end of July.  *If you need a refill on your cardiac medications before your next appointment, please call your pharmacy*  Other Instructions  Keep monitoring her heart rate as we discussed.  Just trying to see the difference between the 2 medications.  Continue to follow using your home heart rate monitor.  May be, if you are able to see something to a snapshot with your phone to capture premature beats.  Would like to see the difference between when you are on Toprol and diltiazem.  Also monitor for fatigue symptoms.  Lab Work:  None   Testing/Procedures: Not needed   Follow-Up: At [facility], you and your health needs are our priority.  As part of our continuing mission to provide you with exceptional heart care, we have created designated Provider Care Teams.  These Care Teams include your primary Cardiologist (physician) and Advanced Practice Providers (APPs -  Physician Assistants and Nurse Practitioners) who all work together to provide you with the care you need, when you need it.    Your next appointment:   As scheduled for end of July  The format for your next appointment:   office  Provider:   Dr. Herbie Baltimore

## 2019-08-07 NOTE — Assessment & Plan Note (Signed)
No active anginal symptoms.  Aggressive lipid control has her LDL less than 70.  Again borderline indication for aspirin.  We opted not to take an aspirin since she does take NSAIDs for arthritis.  With preserved EF, it is okay for her being on a nondihydropyridine calcium blocker as an alternative to beta-blocker.

## 2019-08-07 NOTE — Progress Notes (Signed)
Virtual Visit via Telephone Note   This visit type was conducted due to national recommendations for restrictions regarding the COVID-19 Pandemic (e.g. social distancing) in an effort to limit this patient's exposure and mitigate transmission in our community.  Due to her co-morbid illnesses, this patient is at least at moderate risk for complications without adequate follow up.  This format is felt to be most appropriate for this patient at this time.  The patient did not have access to video technology/had technical difficulties with video requiring transitioning to audio format only (telephone).  All issues noted in this document were discussed and addressed.  No physical exam could be performed with this format.  Please refer to the patient's chart for her  consent to telehealth for [facility].   Patient has given verbal permission to conduct this visit via virtual appointment and to bill insurance 08/07/2019 1:12 PM     Evaluation Performed:  Follow-up visit  Date:  08/07/2019   ID:  Kiara Miller, DOB 08-Aug-1950, MRN [id number]  Patient Location: Home Provider Location: Office  PCP:  Tracey Harries, MD  Cardiologist:   Bryan Lemma, MD  Electrophysiologist:  None   Chief Complaint:   Chief Complaint  Patient presents with  . Follow-up    Had palpitations increase with trying to reduce beta-blocker    History of Present Illness:    Kiara Miller is a 69 y.o. female with PMH notable for PACs and PVCs who presents via audio/video conferencing for a telehealth visit today.  Kiara Miller was last seen in December 2020.  She had indicated that she had a relatively rough year, knee surgery with deconditioning and sedentary life with significant weight gain.  Also had a bout of Hives & vertigo.  Had noted fatigue and weakness since.  Knees and hips bothering her. -> Right knee stiff  Was noted to have coronary calcium score of 376.  Plan to treat lipids and started Toprol, but  at reduced dose.  Noted negative ischemic evaluation  She had telemedicine visit on March 4th  stating that her heart rates are still remaining in the 50s and her palpitations seem to pretty much resolved.  She still feels fatigue with exercise intolerance, but is usually limited by pain in hips and knees.  She says she can go about 15 to 20 minutes and then she is feels like fatigue under energy level just gives out on her.  She has been gaining weight because he has not been able to exercise since her knee surgery. --> Plan: Weaned off Toprol, intention was to reassess heart rate responsiveness to activity.  Goal to get heart rate to 110 bpm.  Hospitalizations:  . n/a   Recent - Interim CV studies:   The following studies were reviewed today: . n/a:  Inerval History   Kiara Miller today stating that when she tried to wean off the beta-blocker, she just started having increased PVCs where she occasionally documented some trigeminy and bigeminy (unfortunately unable to download).  Symptoms got bad enough when she was doing a 2-day off 1 day on of the Toprol that she decided to go back on it.  She has been back on it now for 2 weeks.  She had not been off it enough to notice any changes in her fatigue however.  Also, somewhat complicating issues is that she has been on Plaquenil for her rheumatoid arthritis and has been doing about nausea and dizziness associated with  it.  She also has some shortness of breath.  She is no longer taking Plaquenil and says that the dizziness and nausea is improving some but she still has a shortness of breath that does not seem to be consistent.  Sometimes she feels short of breath doing things sometimes she does not.  Otherwise no chest pain or pressure.  Cardiovascular ROS: positive for - dyspnea on exertion and Exercise intolerance (also limited by knee and ankle pain).  Being off beta-blocker she had more pronounced skipped beats with short bursts of 3-4  beats going fast and then every other beat.  Better back on beta-blocker, but now still fatigued negative for - chest pain, edema, orthopnea, paroxysmal nocturnal dyspnea, shortness of breath or Syncope/near syncope or TIA/amaurosis fugax, claudication  Knee pain - still hurst post-op; & L ankle   ROS:  Please see the history of present illness.    The patient does not have symptoms concerning for COVID-19 infection (fever, chills, cough, or new shortness of breath).  Review of Systems  Constitutional: Positive for malaise/fatigue. Negative for weight loss (Gain).  HENT: Negative for congestion and nosebleeds.   Respiratory: Negative for cough.   Gastrointestinal: Negative for blood in stool and melena.  Genitourinary: Negative for hematuria.  Musculoskeletal: Positive for back pain and joint pain (Knees, hips). Negative for falls.       Right hip and back  Neurological: Positive for dizziness (Rare).  Endo/Heme/Allergies: Negative for environmental allergies. Does not bruise/bleed easily.  Psychiatric/Behavioral: Negative for depression and memory loss. The patient is not nervous/anxious and does not have insomnia.     The patient is practicing social distancing. Has had Both COVID Vaccine shots.   Past Medical History:  Diagnosis Date  . Diverticulitis April 2017  . Hyperlipidemia   . Hypertension   . Joint pain   . Osteoarthritis   . Polyarthralgia   . Rheumatoid arthritis (HCC)   . Right knee pain    Past Surgical History:  Procedure Laterality Date  . ABDOMINAL HYSTERECTOMY    . CARPAL TUNNEL RELEASE    . KNEE ARTHROPLASTY    . MANDIBLE SURGERY    . NM MYOVIEW LTD  06/2017   (Ordered for coronary calcium score 376) LOW RISK.  EF 72%.  No ischemia or infarction.  No wall motion normality.  No change from previous.  . OSTEOTOMY PELVIS BILATERAL    . TONSILLECTOMY    . TONSILLECTOMY    . TOTAL KNEE ARTHROPLASTY Right 09/02/2018   Procedure: TOTAL KNEE ARTHROPLASTY;   Surgeon: Durene Romans, MD;  Location: [facility];  Service: Orthopedics;  Laterality: Right;  70 mins  . TRANSTHORACIC ECHOCARDIOGRAM  12/2010   EF 65-70%. Mild LVH. No RWMA/ Gr1 DD.  . TUBAL LIGATION    . WRIST FUSION       Current Meds  Medication Sig  . cephALEXin (KEFLEX) 500 MG capsule TAKE 4 CAPSULES 1 HOURTPRIOR TO DENTAL APPOINTMENT  . ketoconazole (NIZORAL) 2 % cream Apply 1 application topically daily as needed for irritation.  Marland Kitchen levothyroxine (SYNTHROID, LEVOTHROID) 75 MCG tablet Take 75 mcg by mouth daily before breakfast.  . losartan-hydrochlorothiazide (HYZAAR) 100-25 MG per tablet Take 1 tablet by mouth daily.  . metoprolol succinate (TOPROL-XL) 12.5 mg TB24 24 hr tablet Take 12.5 mg by mouth daily.   . montelukast (SINGULAIR) 10 MG tablet Take 10 mg by mouth at bedtime.    . Omega-3 Fatty Acids (FISH OIL) 1200 MG CAPS Take 1,200 mg  by mouth 2 (two) times daily.    . Vitamin D, Ergocalciferol, (DRISDOL) 50000 UNITS CAPS capsule Take 50,000 Units by mouth every Wednesday.      Allergies:   Clindamycin/lincomycin, Codeine, Leflunomide, Penicillins, Statins, Tetracyclines & related, and Levofloxacin   Social History   Tobacco Use  . Smoking status: Former Smoker    Packs/day: 0.50    Years: 48.00    Pack years: 24.00    Types: Cigarettes    Quit date: 08/18/2018    Years since quitting: 0.9  . Smokeless tobacco: Never Used  Vaping Use  . Vaping Use: Never used  Substance Use Topics  . Alcohol use: No  . Drug use: No     Family Hx: The patient's family history includes Hypertension in her father; Lung disease in her brother.   Labs/Other Tests and Data Reviewed:    EKG:  No ECG reviewed.  Recent Labs: 06/29/2019: ALT 15; BUN 20; Creat 0.83; Hemoglobin 13.9; Platelets 148; Potassium 4.4; Sodium 138   Recent Lipid Panel No results found for: CHOL, TRIG, HDL, CHOLHDL, LDLCALC, LDLDIRECT -> May 22, 2019-TC 127, TG 131, HDL 43, LDL C 61  Wt Readings from Last 3  Encounters:  08/07/19 186 lb (84.4 kg)  07/07/19 192 lb 6.4 oz (87.3 kg)  05/26/19 189 lb 9.6 oz (86 kg)     Objective:    Vital Signs:  BP 138/72   Pulse (!) 50   Ht 5\' 1"  (1.549 m)   Wt 186 lb (84.4 kg)   BMI 35.14 kg/m   VITAL SIGNS:  reviewed GEN:  no acute distress RESPIRATORY:  Nonlabored respirations A&O x 3.  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  Aggressive lipid control has her LDL less than 70.  Again borderline indication for aspirin.  We opted not to take an aspirin since she does take NSAIDs for arthritis.  With preserved EF, it is okay for her being on a nondihydropyridine calcium blocker as an alternative to beta-blocker.      Relevant Medications   diltiazem (CARDIZEM CD) 120 MG 24 hr capsule   Essential hypertension (Chronic)    Pretty well controlled currently with losartan-HCTZ plus or minus low-dose beta-blocker.  Should be okay to switch from beta-blocker to diltiazem.      Relevant Medications   diltiazem (CARDIZEM CD) 120 MG 24 hr capsule   PVC's (premature ventricular contractions) (Chronic)    Unfortunately when she tried to wean off Toprol (at the every third day dosing), had recurrence of palpitations that look like maybe having more PACs, PVCs-isolated couplets as well as bigeminy and trigeminy.  Would not really proceed to actually noticing any benefit as far as fatigue, question just restarted taking Toprol.  She still has a fatigue.  This certainly could be a component of deconditioning related to this, but will try a different option.  We will convert from Toprol to diltiazem XT.   New prescription will be diltiazem XT 120 mg as directed->  For 1 week, take the Toprol every other day alternating with the new diltiazem dose.  After that week then stop Toprol and take  diltiazem every day until seen in follow-up scheduled end of July.      Relevant Medications   diltiazem (CARDIZEM CD) 120 MG 24 hr  capsule   History of obesity    Problematic due to her lack of mobility because of knee and ankle pain.  She would really benefit from having access to a pool in order to build to do pool aerobics and other exercise.  Talked by the importance of dietary modification.         COVID-19 Education: The signs and symptoms of COVID-19 were discussed with the patient and how to seek care for testing (follow up with PCP or arrange E-visit).   The importance of social distancing was discussed today.  Time:   Today, I have spent 18 minutes with the patient with telehealth technology discussing the above problems -> additional 6 minutes with charting.   Total   Medication Adjustments/Labs and Tests Ordered: Current medicines are reviewed at length with the patient today.  Concerns regarding medicines are outlined above.   Patient Instructions  Medication Instructions:   Lets try to convert from Toprol to diltiazem XT.  New prescription will be diltiazem XT 120 mg as directed->  For 1 week, take the Toprol every other day alternating with the new diltiazem dose.  After that week then stop Toprol and take diltiazem every day until seen in follow-up scheduled end of July.  *If you need a refill on your cardiac medications before your next appointment, please call your pharmacy*  Other Instructions  Keep monitoring her heart rate as we discussed.  Just trying to see the difference between the 2 medications.  Continue to follow using your home heart rate monitor.  May be, if you are able to see something to a snapshot with your phone to capture premature beats.  Would like to see the difference between when you are on Toprol and diltiazem.  Also monitor for fatigue symptoms.  Lab Work:  None   Testing/Procedures: Not needed   Follow-Up: At  [facility], you and your health needs are our priority.  As part of our continuing mission to provide you with exceptional heart care, we have created designated Provider Care Teams.  These Care Teams include your primary Cardiologist (physician) and Advanced Practice Providers (APPs -  Physician Assistants and Nurse Practitioners) who all work together to provide you with the care you need, when you need it.    Your next appointment:   As scheduled for end of July  The format for your next appointment:   office  Provider:   Dr. Herbie Baltimore        Signed, Bryan Lemma, MD  08/07/2019 1:12 PM    [facility]

## 2019-08-07 NOTE — Assessment & Plan Note (Signed)
Problematic due to her lack of mobility because of knee and ankle pain.  She would really benefit from having access to a pool in order to build to do pool aerobics and other exercise.  Talked by the importance of dietary modification.

## 2019-08-07 NOTE — Telephone Encounter (Signed)
  Patient Consent for Virtual Visit         Kiara Miller has provided verbal consent on 08/07/2019 for a virtual visit (video or telephone).   CONSENT FOR VIRTUAL VISIT FOR:  Kiara Miller  By participating in this virtual visit I agree to the following:  I hereby voluntarily request, consent and authorize [facility] and its employed or contracted physicians, physician assistants, nurse practitioners or other licensed health care professionals (the Practitioner), to provide me with telemedicine health care services (the "Services") as deemed necessary by the treating Practitioner. I acknowledge and consent to receive the Services by the Practitioner via telemedicine. I understand that the telemedicine visit will involve communicating with the Practitioner through live audiovisual communication technology and the disclosure of certain medical information by electronic transmission. I acknowledge that I have been given the opportunity to request an in-person assessment or other available alternative prior to the telemedicine visit and am voluntarily participating in the telemedicine visit.  I understand that I have the right to withhold or withdraw my consent to the use of telemedicine in the course of my care at any time, without affecting my right to future care or treatment, and that the Practitioner or I may terminate the telemedicine visit at any time. I understand that I have the right to inspect all information obtained and/or recorded in the course of the telemedicine visit and may receive copies of available information for a reasonable fee.  I understand that some of the potential risks of receiving the Services via telemedicine include:  Marland Kitchen Delay or interruption in medical evaluation due to technological equipment failure or disruption; . Information transmitted may not be sufficient (e.g. poor resolution of images) to allow for appropriate medical decision making by the Practitioner;  and/or  . In rare instances, security protocols could fail, causing a breach of personal health information.  Furthermore, I acknowledge that it is my responsibility to provide information about my medical history, conditions and care that is complete and accurate to the best of my ability. I acknowledge that Practitioner's advice, recommendations, and/or decision may be based on factors not within their control, such as incomplete or inaccurate data provided by me or distortions of diagnostic images or specimens that may result from electronic transmissions. I understand that the practice of medicine is not an exact science and that Practitioner makes no warranties or guarantees regarding treatment outcomes. I acknowledge that a copy of this consent can be made available to me via my patient portal [facility] MyChart), or I can request a printed copy by calling the office of [facility].    I understand that my insurance will be billed for this visit.   I have read or had this consent read to me. . I understand the contents of this consent, which adequately explains the benefits and risks of the Services being provided via telemedicine.  . I have been provided ample opportunity to ask questions regarding this consent and the Services and have had my questions answered to my satisfaction. . I give my informed consent for the services to be provided through the use of telemedicine in my medical care

## 2019-08-07 NOTE — Assessment & Plan Note (Signed)
Unfortunately when she tried to wean off Toprol (at the every third day dosing), had recurrence of palpitations that look like maybe having more PACs, PVCs-isolated couplets as well as bigeminy and trigeminy.  Would not really proceed to actually noticing any benefit as far as fatigue, question just restarted taking Toprol.  She still has a fatigue.  This certainly could be a component of deconditioning related to this, but will try a different option.  We will convert from Toprol to diltiazem XT.   New prescription will be diltiazem XT 120 mg as directed->  For 1 week, take the Toprol every other day alternating with the new diltiazem dose.  After that week then stop Toprol and take diltiazem every day until seen in follow-up scheduled end of July.

## 2019-08-07 NOTE — Telephone Encounter (Signed)
RN spoke to patient. Instruction were given  from today's virtual visit 08/07/19 .  AVS SUMMARY has been sent by mychart . E-sent prescription to pharmacy [facility] 09/04/19 appt in office.   Patient verbalized understanding

## 2019-08-26 NOTE — Progress Notes (Signed)
Office Visit Note  Patient: Kiara Miller             Date of Birth: March 25, 1950           MRN: [id number]             PCP: Tracey Harries, MD Referring: Tracey Harries, MD Visit Date: 09/08/2019 Occupation: @GUAROCC @  Subjective:  Medication monitoring  History of Present Illness: Kiara Miller is a 69 y.o. female with history of seropositive rheumatoid arthritis and osteoarthritis.  She was started on Plaquenil 200 mg 1 tablet by mouth twice daily Monday through Friday in April 2021 but has discontinued after 2 months due to nausea and dizziness. According to the patient she continues to have persistent lightheadedness. She is also been experiencing increased fatigue and shortness of breath. According to the patient she is undergoing a cardiac workup by Dr. Herbie Baltimore. She will be scheduled for a CT angiogram soon. According to the patient she had a recent EKG which revealed a right bundle branch block which has started to concern her. She has not been evaluated by a pulmonologist yet. She presents today with ongoing pain and stiffness in both hands and both wrists. She states that her pain is most severe in her left wrist joint which is fused. She also has intermittent pain in both shoulder joints.  Activities of Daily Living:  Patient reports joint stiffness all day  Patient Reports nocturnal pain.  Difficulty dressing/grooming: Reports Difficulty climbing stairs: Reports Difficulty getting out of chair: Reports Difficulty using hands for taps, buttons, cutlery, and/or writing: Reports  Review of Systems  Constitutional: Positive for fatigue.  HENT: Positive for mouth dryness. Negative for mouth sores and nose dryness.   Eyes: Negative for pain, itching, visual disturbance and dryness.  Respiratory: Positive for shortness of breath. Negative for cough, hemoptysis and difficulty breathing.   Cardiovascular: Negative for chest pain, palpitations, hypertension and swelling in legs/feet.   Gastrointestinal: Negative for blood in stool, constipation, diarrhea and nausea.  Endocrine: Negative for increased urination.  Genitourinary: Negative for difficulty urinating and painful urination.  Musculoskeletal: Positive for arthralgias, joint pain, joint swelling and morning stiffness. Negative for myalgias, muscle weakness, muscle tenderness and myalgias.  Skin: Negative for color change, pallor, rash, hair loss, nodules/bumps, redness, skin tightness, ulcers and sensitivity to sunlight.  Allergic/Immunologic: Negative for susceptible to infections.  Neurological: Positive for dizziness, light-headedness and numbness. Negative for headaches, memory loss and weakness.  Hematological: Positive for bruising/bleeding tendency. Negative for swollen glands.  Psychiatric/Behavioral: Negative for depressed mood, confusion and sleep disturbance. The patient is not nervous/anxious.     PMFS History:  Patient Active Problem List   Diagnosis Date Noted  . Feeling of chest tightness 09/04/2019  . Chronic fatigue 04/09/2019  . Obese 09/03/2018  . 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 PAC (premature atrial contraction) 07/11/2017  . History of hypothyroidism 06/11/2017  . Essential hypertension 06/11/2017  . History of obesity 06/11/2017  . S/P right knee arthroscopy 06/11/2017  . DDD (degenerative disc disease), cervical 06/11/2017  . DDD (degenerative disc disease), lumbar 06/11/2017  . History of carpal tunnel surgery of right wrist 06/11/2017  . Primary osteoarthritis of both knees 06/11/2017  . Chest pain, precordial 05/05/2017  . PVC's (premature ventricular contractions) 05/03/2017  . 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 Uveitis 10/10/2015  . Diverticulitis 10/10/2015  . Dyspnea 12/05/2010  .  Palpitations 12/05/2010    Past Medical History:  Diagnosis Date  .  Diverticulitis April 2017  . Hyperlipidemia   . Hypertension   . Joint pain   . Osteoarthritis   . Polyarthralgia   . Rheumatoid arthritis (HCC)   . Right knee pain     Family History  Problem Relation Age of Onset  . Hypertension Father   . Lung disease Brother    Past Surgical History:  Procedure Laterality Date  . ABDOMINAL HYSTERECTOMY    . CARPAL TUNNEL RELEASE    . KNEE ARTHROPLASTY    . MANDIBLE SURGERY    . NM MYOVIEW LTD  06/2017   (Ordered for coronary calcium score 376) LOW RISK.  EF 72%.  No ischemia or infarction.  No wall motion normality.  No change from previous.  . OSTEOTOMY PELVIS BILATERAL    . TONSILLECTOMY    . TONSILLECTOMY    . TOTAL KNEE ARTHROPLASTY Right 09/02/2018   Procedure: TOTAL KNEE ARTHROPLASTY;  Surgeon: Durene Romans, MD;  Location: [facility];  Service: Orthopedics;  Laterality: Right;  70 mins  . TRANSTHORACIC ECHOCARDIOGRAM  12/2010   EF 65-70%. Mild LVH. No RWMA/ Gr1 DD.  . TUBAL LIGATION    . WRIST FUSION     Social History   Social History Narrative  . Not on file   Immunization History  Administered Date(s) Administered  . PFIZER SARS-COV-2 Vaccination 02/25/2019, 03/19/2019  . Pneumococcal Polysaccharide-23 07/07/2012     Objective: Vital Signs: BP 128/72 (BP Location: Left Arm, Patient Position: Sitting, Cuff Size: Normal)   Pulse 64   Resp 15   Ht 5' 1.5" (1.562 m)   Wt 192 lb (87.1 kg)   BMI 35.69 kg/m    Physical Exam Vitals and nursing note reviewed.  Constitutional:      Appearance: She is well-developed.  HENT:     Head: Normocephalic and atraumatic.  Eyes:     Conjunctiva/sclera: Conjunctivae normal.  Pulmonary:     Effort: Pulmonary effort is normal.  Abdominal:     General: Bowel sounds are normal.     Palpations: Abdomen is soft.  Musculoskeletal:     Cervical back: Normal range of motion.  Lymphadenopathy:     Cervical: No cervical adenopathy.  Skin:    General: Skin is warm and dry.     Capillary  Refill: Capillary refill takes less than 2 seconds.  Neurological:     Mental Status: She is alert and oriented to person, place, and time.  Psychiatric:        Behavior: Behavior normal.      Musculoskeletal Exam: C-spine, thoracic spine, and lumbar spine good ROM.  Shoulder joints and elbow joints good ROM with no discomfort.  Extremely limited ROM of left wrist-s/p surgical fusion.  Right wrist has good ROM with no tenderness or inflammation.  Tenderness of the left CMC joint. Right knee replacement has good ROM.  Left knee good ROM with no warmth or effusion. Ankle joints good ROM with some discomfort in the left ankle joint.  No warmth or tenderness of ankle joints noted.   CDAI Exam: CDAI Score: -- Patient Global: --; Provider Global: -- Swollen: --; Tender: -- Joint Exam 09/08/2019   No joint exam has been documented for this visit   There is currently no information documented on the homunculus. Go to the Rheumatology activity and complete the homunculus joint exam.  Investigation: No additional findings.  Imaging: XR Foot 2 Views Left  Result Date: 09/08/2019 First MTP, PIP and DIP narrowing was noted.  No intertarsal or tibiotalar or subtalar joint space narrowing was noted.  Inferior and posterior calcaneal spurs were noted.  No erosive changes were noted. Impression: These findings are consistent with osteoarthritis of the foot.  XR Foot 2 Views Right  Result Date: 09/08/2019 First MTP, PIP and DIP narrowing was noted.  No intertarsal or tibiotalar or subtalar joint space narrowing was noted.  Inferior and posterior calcaneal spurs were noted.  No erosive changes were noted. Impression: These findings are consistent with osteoarthritis of the foot.  XR Hand 2 View Left  Result Date: 09/08/2019 CMC, PIP and DIP narrowing was noted.  No MCP narrowing was noted.  Postsurgical changes were noted in the wrist joint.  Hardware was present in the left radius. Impression: These  findings are consistent with inflammatory arthritis and osteoarthritis overlap.  XR Hand 2 View Right  Result Date: 09/08/2019 First MCP narrowing was noted.  PIP and DIP narrowing was noted.  Spurring of right first PIP joint was noted.  No intercarpal radiocarpal joint space narrowing was noted.  No erosive changes were noted. 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   Recent Labs: Lab Results  Component Value Date   WBC 3.8 06/29/2019   HGB 13.9 06/29/2019   PLT 148 06/29/2019   NA 138 06/29/2019   K 4.4 06/29/2019   CL 104 06/29/2019   CO2 26 06/29/2019   GLUCOSE 88 06/29/2019   BUN 20 06/29/2019   CREATININE 0.83 06/29/2019   BILITOT 0.6 06/29/2019   ALKPHOS 50 09/21/2018   AST 19 06/29/2019   ALT 15 06/29/2019   PROT 6.7 06/29/2019   ALBUMIN 4.1 09/21/2018   CALCIUM 9.5 06/29/2019   GFRAA 83 06/29/2019   QFTBGOLD Negative 08/30/2016    Speciality Comments: Orencia IV 750mg  every 4 weeks, TB gold negative 08/30/16  PLQ Eye Exam: 03/09/2019 WNL @ [facility] Follow up in 6 months.   She will be undergoing a cardiac CT angiogram per recommendations of her cardiologist, Dr. 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  She has ongoing pain and stiffness in both hands and both feet due to underlying osteoarthritis.  She has tenderness of the left CMC joint on exam.  X-rays of both hands and both feet were updated today.  No x-rays for comparison were available.  No erosive changes were noted.  She was started on Plaquenil in April 2021 but discontinued due to experiencing nausea  and dizziness.  We discussed reducing the dose of Plaquenil but she declined at this time.  She has been experiencing increased shortness of breath and fatigue and is undergoing a thorough work-up by Dr. May 2021.  She will be scheduling a CT angiogram soon for further evaluation. She is concerned that her untreated RA is contributing to her increased SOB.  She declined a pulmonary referral at this time. She would like to hold off on starting on any other immunosuppressive agents at this time.  She is potentially interested in restarting on Orencia if subcutaneous injections will be approved by her insurance in the future.  We will further discuss treatment options at her follow-up  visit.  She will follow-up in the office in 3 months- Plan: XR Hand 2 View Right, XR Hand 2 View Left, XR Foot 2 Views Right, XR Foot 2 Views Left  High risk medication use -Plaquenil was started in April 2021 and she took 200 mg 1 tablet twice daily Monday through Friday for 2 months and discontinued on her own due to experiencing nausea and dizziness.  We discussed reducing the dose of Plaquenil but she declined at this time.  PLQ Eye Exam: 03/09/2019 WNL  Bilateral hand pain - She has chronic pain and stiffness in both hands.  She has PIP and DIP thickening consistent with osteoarthritis of both hands bilaterally.  She has tenderness of the left CMC joint on exam.  No synovitis was noted.  She has complete fist formation bilaterally.  She experiences decreased grip strength and difficulty with fine motor movements at times.  X-rays of both hands were updated today which did not reveal any erosive changes.  Joint protection and muscle strengthening were discussed.  Plan: XR Hand 2 View Right, XR Hand 2 View Left  Bilateral foot pain -She has chronic pain and stiffness in both feet.  She has chronic pain in the left ankle joint but does not have any warmth or tenderness on exam today.  According to the patient she has had cortisone  injections in the left ankle performed by Dr. Laurian Brim in the past which have alleviated some of her discomfort.  Plan: XR Foot 2 Views Right, XR Foot 2 Views Left  Status post fusion of wrist (left): Limited ROM.  No tenderness, warmth, or inflammation noted on exam.   Primary osteoarthritis of left knee: She has good ROM with no warmth or effusion.   S/P total knee arthroplasty, right: Doing well.  She has good ROM with no discomfort.  No warmth or effusion noted.   DDD (degenerative disc disease), cervical: She has good ROM with no discomfort.   DDD (degenerative disc disease), lumbar - She is followed by Dr. Laurian Brim.  Coccyx pain: Resolved.   Other medical conditions are listed as follows:   History of carpal tunnel surgery of right wrist  History of diverticulitis  History of hypertension  History of hypothyroidism    Orders: Orders Placed This Encounter  Procedures  . XR Hand 2 View Right  . XR Hand 2 View Left  . XR Foot 2 Views Right  . XR Foot 2 Views Left   No orders of the defined types were placed in this encounter.   Face-to-face time spent with patient was 30 minutes. Greater than 50% of time was spent in counseling and coordination of care.  Follow-Up Instructions: Return in about 3 months (around 12/09/2019) for Rheumatoid arthritis, Osteoarthritis, DDD.   Gearldine Bienenstock, PA-C  Note - This record has been created using Dragon software.  Chart creation errors have been sought, but may not always  have been located. Such creation errors do not reflect on  the standard of medical care.

## 2019-09-04 ENCOUNTER — Other Ambulatory Visit: Payer: Self-pay

## 2019-09-04 ENCOUNTER — Encounter: Payer: Self-pay | Admitting: Cardiology

## 2019-09-04 ENCOUNTER — Ambulatory Visit (INDEPENDENT_AMBULATORY_CARE_PROVIDER_SITE_OTHER): Payer: Medicare Other | Admitting: Cardiology

## 2019-09-04 VITALS — BP 140/84 | HR 60 | Ht 61.5 in | Wt 193.0 lb

## 2019-09-04 DIAGNOSIS — R0600 Dyspnea, unspecified: Secondary | ICD-10-CM | POA: Diagnosis not present

## 2019-09-04 DIAGNOSIS — I208 Other forms of angina pectoris: Secondary | ICD-10-CM

## 2019-09-04 DIAGNOSIS — Z01810 Encounter for preprocedural cardiovascular examination: Secondary | ICD-10-CM

## 2019-09-04 DIAGNOSIS — I10 Essential (primary) hypertension: Secondary | ICD-10-CM

## 2019-09-04 DIAGNOSIS — I493 Ventricular premature depolarization: Secondary | ICD-10-CM

## 2019-09-04 DIAGNOSIS — R931 Abnormal findings on diagnostic imaging of heart and coronary circulation: Secondary | ICD-10-CM | POA: Diagnosis not present

## 2019-09-04 DIAGNOSIS — R0789 Other chest pain: Secondary | ICD-10-CM | POA: Insufficient documentation

## 2019-09-04 DIAGNOSIS — I491 Atrial premature depolarization: Secondary | ICD-10-CM

## 2019-09-04 DIAGNOSIS — R0609 Other forms of dyspnea: Secondary | ICD-10-CM

## 2019-09-04 DIAGNOSIS — I2089 Other forms of angina pectoris: Secondary | ICD-10-CM

## 2019-09-04 DIAGNOSIS — R072 Precordial pain: Secondary | ICD-10-CM

## 2019-09-04 MED ORDER — METOPROLOL TARTRATE 50 MG PO TABS: ORAL_TABLET | ORAL | 0 refills | Status: DC

## 2019-09-04 NOTE — Patient Instructions (Signed)
Medication Instructions:   CONTINUE Diltiazem every other day for 1 week then STOP  Metoprolol Tartrate (Lopressor) 50 mg 2 hours prior to Coronary CTA  *If you need a refill on your cardiac medications before your next appointment, please call your pharmacy*  Lab Work: Your physician recommends that you return for lab work in:   BMET If you have labs (blood work) drawn today and your tests are completely normal, you will receive your results only by: Marland Kitchen MyChart Message (if you have MyChart) OR . A paper copy in the mail If you have any lab test that is abnormal or we need to change your treatment, we will call you to review the results.   Testing/Procedures: Your physician has requested that you have cardiac CT. Cardiac computed tomography (CT) is a painless test that uses an x-ray machine to take clear, detailed pictures of your heart. Cardiac CT Angiogram A cardiac CT angiogram is a procedure to look at the heart and the area around the heart. It may be done to help find the cause of chest pains or other symptoms of heart disease. During this procedure, a substance called contrast dye is injected into the blood vessels in the area to be checked. A large X-ray machine, called a CT scanner, then takes detailed pictures of the heart and the surrounding area. The procedure is also sometimes called a coronary CT angiogram, coronary artery scanning, or CTA. A cardiac CT angiogram allows the health care provider to see how well blood is flowing to and from the heart. The health care provider will be able to see if there are any problems, such as:  Blockage or narrowing of the coronary arteries in the heart.  Fluid around the heart.  Signs of weakness or disease in the muscles, valves, and tissues of the heart. Tell a health care provider about:  Any allergies you have. This is especially important if you have had a previous allergic reaction to contrast dye.  All medicines you are taking,  including vitamins, herbs, eye drops, creams, and over-the-counter medicines.  Any blood disorders you have.  Any surgeries you have had.  Any medical conditions you have.  Whether you are pregnant or may be pregnant.  Any anxiety disorders, chronic pain, or other conditions you have that may increase your stress or prevent you from lying still. What are the risks? Generally, this is a safe procedure. However, problems may occur, including:  Bleeding.  Infection.  Allergic reactions to medicines or dyes.  Damage to other structures or organs.  Kidney damage from the contrast dye that is used.  Increased risk of cancer from radiation exposure. This risk is low. Talk with your health care provider about: ? The risks and benefits of testing. ? How you can receive the lowest dose of radiation. What happens before the procedure?  Wear comfortable clothing and remove any jewelry, glasses, dentures, and hearing aids.  Follow instructions from your health care provider about eating and drinking. This may include: ? For 12 hours before the procedure -- avoid caffeine. This includes tea, coffee, soda, energy drinks, and diet pills. Drink plenty of water or other fluids that do not have caffeine in them. Being well hydrated can prevent complications. ? For 4-6 hours before the procedure -- stop eating and drinking. The contrast dye can cause nausea, but this is less likely if your stomach is empty.  Ask your health care provider about changing or stopping your regular medicines. This is  especially important if you are taking diabetes medicines, blood thinners, or medicines to treat problems with erections (erectile dysfunction). What happens during the procedure?   Hair on your chest may need to be removed so that small sticky patches called electrodes can be placed on your chest. These will transmit information that helps to monitor your heart during the procedure.  An IV will be  inserted into one of your veins.  You might be given a medicine to control your heart rate during the procedure. This will help to ensure that good images are obtained.  You will be asked to lie on an exam table. This table will slide in and out of the CT machine during the procedure.  Contrast dye will be injected into the IV. You might feel warm, or you may get a metallic taste in your mouth.  You will be given a medicine called nitroglycerin. This will relax or dilate the arteries in your heart.  The table that you are lying on will move into the CT machine tunnel for the scan.  The person running the machine will give you instructions while the scans are being done. You may be asked to: ? Keep your arms above your head. ? Hold your breath. ? Stay very still, even if the table is moving.  When the scanning is complete, you will be moved out of the machine.  The IV will be removed. The procedure may vary among health care providers and hospitals. What can I expect after the procedure? After your procedure, it is common to have:  A metallic taste in your mouth from the contrast dye.  A feeling of warmth.  A headache from the nitroglycerin. Follow these instructions at home:  Take over-the-counter and prescription medicines only as told by your health care provider.  If you are told, drink enough fluid to keep your urine pale yellow. This will help to flush the contrast dye out of your body.  Most people can return to their normal activities right after the procedure. Ask your health care provider what activities are safe for you.  It is up to you to get the results of your procedure. Ask your health care provider, or the department that is doing the procedure, when your results will be ready.  Keep all follow-up visits as told by your health care provider. This is important. Contact a health care provider if:  You have any symptoms of allergy to the contrast dye. These  include: ? Shortness of breath. ? Rash or hives. ? A racing heartbeat. Summary  A cardiac CT angiogram is a procedure to look at the heart and the area around the heart. It may be done to help find the cause of chest pains or other symptoms of heart disease.  During this procedure, a large X-ray machine, called a CT scanner, takes detailed pictures of the heart and the surrounding area after a contrast dye has been injected into blood vessels in the area.  Ask your health care provider about changing or stopping your regular medicines before the procedure. This is especially important if you are taking diabetes medicines, blood thinners, or medicines to treat erectile dysfunction.  If you are told, drink enough fluid to keep your urine pale yellow. This will help to flush the contrast dye out of your body. This information is not intended to replace advice given to you by your health care provider. Make sure you discuss any questions you have with your health  care provider. Document Revised: 09/17/2018 Document Reviewed: 09/17/2018 Elsevier Patient Education  [place]: At [facility], you and your health needs are our priority.  As part of our continuing mission to provide you with exceptional heart care, we have created designated Provider Care Teams.  These Care Teams include your primary Cardiologist (physician) and Advanced Practice Providers (APPs -  Physician Assistants and Nurse Practitioners) who all work together to provide you with the care you need, when you need it.  Your next appointment:   Follow up after Coronary CTA   The format for your next appointment:   In Person  Provider:   Glenetta Hew, MD  Other Instructions    Your cardiac CT will be scheduled at one of the below locations:   [facility] [address] [place], [place] [address] [phone]  [place] [address] [place], [place] [address] [phone]  If scheduled at [facility], please arrive at the [facility] main entrance of [facility] 30 minutes prior to test start time. Proceed to the [facility] Department (first floor) to check-in and test prep.  If scheduled at [facility], please arrive 15 mins early for check-in and test prep.  Please follow these instructions carefully (unless otherwise directed):  Hold all erectile dysfunction medications at least 3 days (72 hrs) prior to test.  On the Night Before the Test: . Be sure to Drink plenty of water. . Do not consume any caffeinated/decaffeinated beverages or chocolate 12 hours prior to your test. . Do not take any antihistamines 12 hours prior to your test. . If the patient has contrast allergy: ? Patient will need a prescription for Prednisone and very clear instructions (as follows): 1. Prednisone 50 mg - take 13 hours prior to test 2. Take another Prednisone 50 mg 7 hours prior to test 3. Take another Prednisone 50 mg 1 hour prior to test 4. Take Benadryl 50 mg 1 hour prior to test . Patient must complete all four doses of above prophylactic medications. . Patient will need a ride after test due to Benadryl.  On the Day of the Test: . Drink plenty of water. Do not drink any water within one hour of the test. . Do not eat any food 4 hours prior to the test. . You may take your regular medications prior to the test.  . Take metoprolol (Lopressor) two hours prior to test. . HOLD Furosemide/Hydrochlorothiazide morning of the test. . FEMALES- please wear underwire-free bra if available        -Drink plenty of water       -Hold Furosemide/hydrochlorothiazide morning of the test       -Take metoprolol (Lopressor) 2 hours prior to test (if applicable).                  -If HR is less than 55 BPM- No Lopressor                -IF HR is greater than 55 BPM and patient is  less than or equal to 34 yrs old Lopressor 126m x1.                -If HR is greater than 55 BPM and patient is greater than 730yrs old Lopressor 50 mg x1.     Do not give Lopressor to patients with an allergy to lopressor  or anyone with asthma or active COPD symptoms (currently taking steroids).       After the Test: . Drink plenty of water. . After receiving IV contrast, you may experience a mild flushed feeling. This is normal. . On occasion, you may experience a mild rash up to 24 hours after the test. This is not dangerous. If this occurs, you can take Benadryl 25 mg and increase your fluid intake. . If you experience trouble breathing, this can be serious. If it is severe call 911 IMMEDIATELY. If it is mild, please call our office. . If you take any of these medications: Glipizide/Metformin, Avandament, Glucavance, please do not take 48 hours after completing test unless otherwise instructed.   Once we have confirmed authorization from your insurance company, we will call you to set up a date and time for your test. Based on how quickly your insurance processes prior authorizations requests, please allow up to 4 weeks to be contacted for scheduling your Cardiac CT appointment. Be advised that routine Cardiac CT appointments could be scheduled as many as 8 weeks after your provider has ordered it.  For non-scheduling related questions, please contact the cardiac imaging nurse navigator should you have any questions/concerns: Marchia Bond, Cardiac Imaging Nurse Navigator Burley Saver, Interim Cardiac Imaging Nurse [facility] Direct Office Dial: [phone]   For scheduling needs, including cancellations and rescheduling, please call Vivien Rota at [phone], option [phone].

## 2019-09-04 NOTE — Progress Notes (Signed)
Primary Care Provider: Bernerd Limbo, MD Cardiologist: Glenetta Hew, MD Electrophysiologist: None  Clinic Note: Chief Complaint  Patient presents with  . Follow-up    Early follow-up after medication change  . Shortness of Breath    On exertion  . Chest Pain    Chest tightness/fatigue  . Palpitations   HPI:    Kiara Miller is a 69 y.o. female with a PMH notable for rheumatoid arthritis, hypertension, hyperlipidemia, and hypothyroidism with strong family history of CAD (father MI at age 22 as well as paternal grandmother had aortic aneurysm) who presents today for unexpected 4-week follow-up after medication adjustment..   Initially seen by me back on May 03, 2017 for off-and-on episodes of skipping heartbeats and chest discomfort.She was evaluated with a coronary calcium score which was somewhat elevated and therefore followed up with a Myoview stress test-reviewed and PSH below. She herself purchased a portable home cardiac monitor (EMAY)showing PACs, PVCs with some trigeminy..  Palpitations had initially been well controlled with low-dose Toprol.  Kiara Miller was last just seen via telemedicine on August 07, 2019 with complaints of heart palpitations while trying to reduce beta-blocker.  I had seen her on March 4 via telemedicine, and her heart rates were in the 50s with palpitations much improved/resolved.  Limited by hip and knee pain. --> Plan was to have weaned off Toprol and reassess heart responsiveness due to fatigue.  Unfortunately, she noted that she was unable to tolerate this tapering off the Toprol.  She decide to go back onto it before ever completing the taper.  She had not even been off of enough to notice of her fatigue improved.  Was also noting dizziness and nausea while being on Plaquenil.  This was in association with some dyspnea.  Still limited by right knee and ankle pain from arthritis.  Palpitations improved being back on beta-blocker.  (Right knee  still hurting postop along the left ankle.  Also right hip and back pain.  No active angina.  Converted from beta-blocker to diltiazem XT 120 mg.  Recent Hospitalizations: None  Reviewed  CV studies:    The following studies were reviewed today: (if available, images/films reviewed: From Epic Chart or Care Everywhere) . None:   Interval History:   Kiara Miller returns here today noticing that the palpitations were better on diltiazem but still having fatigue.  Still feeling short of breath that was worse with exertion and associated with some discomfort in her chest.  She felt as though her heart was just cannot pound out of her chest.  She was told by her rheumatologist that while Plaquenil may be related to some nausea, it does not have a side effect of dyspnea.  I do not get a sense that her dyspnea is all that significant last visit, but it seems to be worse now.  When she also notices just significant fatigue and lack of any energy.  She is worn out, unable to do anything.  She just has a general feeling of not feeling well.  Just very worried about all of her symptoms.  Not sure what is related to her RA but is related to cardiac issues.  CV Review of Symptoms (Summary) Cardiovascular ROS: positive for - chest pain, dyspnea on exertion, palpitations and Forceful pounding beats, but overall palpitations better negative for - edema, orthopnea, paroxysmal nocturnal dyspnea, shortness of breath or Syncope/near syncope, TIA/amaurosis fugax, claudication  The patient does not have symptoms concerning for COVID-19  infection (fever, chills, cough, or new shortness of breath).  The patient is practicing social distancing & Masking.   Has completed both COVID-19 vaccine injections.  REVIEWED OF SYSTEMS   Review of Systems  Constitutional: Positive for malaise/fatigue. Negative for weight loss.  Respiratory: Positive for shortness of breath. Negative for cough.   Gastrointestinal:  Positive for nausea. Negative for blood in stool, melena and vomiting.  Genitourinary: Negative for hematuria.  Musculoskeletal: Positive for back pain and joint pain (Hip, right knee, and left ankle). Negative for falls.  Neurological: Positive for dizziness. Negative for focal weakness and weakness.  Psychiatric/Behavioral: Negative for hallucinations and memory loss. The patient is nervous/anxious. The patient does not have insomnia.    I have reviewed and (if needed) personally updated the patient's problem list, medications, allergies, past medical and surgical history, social and family history.   PAST MEDICAL HISTORY   Past Medical History:  Diagnosis Date  . Diverticulitis April 2017  . Hyperlipidemia   . Hypertension   . Joint pain   . Osteoarthritis   . Polyarthralgia   . Rheumatoid arthritis ([place])   . Right knee pain     PAST SURGICAL HISTORY   Past Surgical History:  Procedure Laterality Date  . ABDOMINAL HYSTERECTOMY    . CARPAL TUNNEL RELEASE    . KNEE ARTHROPLASTY    . MANDIBLE SURGERY    . NM MYOVIEW LTD  06/2017   (Ordered for coronary calcium score 376) LOW RISK.  EF 72%.  No ischemia or infarction.  No wall motion normality.  No change from previous.  . OSTEOTOMY PELVIS BILATERAL    . TONSILLECTOMY    . TONSILLECTOMY    . 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 TRANSTHORACIC ECHOCARDIOGRAM  12/2010   EF 65-70%. Mild LVH. No RWMA/ Gr1 DD.  . TUBAL LIGATION    . WRIST FUSION      MEDICATIONS/ALLERGIES   Current Meds  Medication Sig  . cephALEXin (KEFLEX) 500 MG capsule TAKE 4 CAPSULES 1 HOURTPRIOR TO DENTAL APPOINTMENT  . ketoconazole (NIZORAL) 2 % cream Apply 1 application topically daily as needed for irritation.  Marland Kitchen levothyroxine (SYNTHROID, LEVOTHROID) 75 MCG tablet Take 75 mcg by mouth daily before breakfast.   . losartan-hydrochlorothiazide (HYZAAR) 100-25 MG per tablet Take 1 tablet by mouth daily.  . montelukast (SINGULAIR) 10 MG tablet Take 10 mg by mouth at bedtime.    . Omega-3 Fatty Acids (FISH OIL) 1200 MG CAPS Take 1,200 mg by mouth 2 (two) times daily.    . Vitamin D, Ergocalciferol, (DRISDOL) 50000 UNITS CAPS capsule Take 50,000 Units by mouth every Wednesday.   . [DISCONTINUED] diltiazem (CARDIZEM CD) 120 MG 24 hr capsule Take 1 capsule (120 mg total) by mouth daily. (Patient not taking: Reported on 09/08/2019)    Allergies  Allergen Reactions  . Clindamycin/Lincomycin Other (See Comments)    Severe esophagitis  . Codeine Itching  . Leflunomide Nausea Only  . Penicillins Itching    Did it involve swelling of the face/tongue/throat, SOB, or low BP? No Did it involve sudden or severe rash/hives, skin peeling, or any reaction on the inside of your mouth or nose? No Did you need to seek medical attention at a hospital or doctor's office? No When did it last happen?2010 If all above answers are "NO", may proceed with cephalosporin use.   Marland Kitchen  Statins   . Tetracyclines & Related Nausea And Vomiting  . Levofloxacin Nausea Only and Palpitations    SOCIAL HISTORY/FAMILY HISTORY   Reviewed in Epic:  Pertinent findings: No new findings  OBJCTIVE -PE, EKG, labs   Wt Readings from Last 3 Encounters:  09/08/19 192 lb (87.1 kg)  09/04/19 193 lb (87.5 kg)  08/07/19 186 lb (84.4 kg)  December 2019 weight 164 pounds, August 2020 weight 170 pounds, December 2020 weight 181 pounds.  Physical Exam: BP (!) 140/84   Pulse 60   Ht 5' 1.5" (1.562 m)   Wt 193 lb (87.5 kg)   SpO2 98%   BMI 35.88 kg/m  Physical Exam Vitals reviewed.  Constitutional:      General: She is not in acute distress.    Appearance: She is obese. She is not ill-appearing (Just has a sensation of not feeling well.  He seems worried.) or toxic-appearing.     Comments: Notable increase in weight from my last saw  her personally.  HENT:     Head: Normocephalic and atraumatic.     Comments: Appropriate mask covering in place Neck:     Vascular: No carotid bruit or JVD.  Cardiovascular:     Rate and Rhythm: Normal rate and regular rhythm.  No extrasystoles are present.    Chest Wall: PMI is not displaced (Unable to assess).     Pulses: Intact distal pulses. No decreased pulses.     Heart sounds: Normal heart sounds. No murmur heard.  No friction rub. No gallop.   Pulmonary:     Effort: Pulmonary effort is normal. No respiratory distress.     Breath sounds: Normal breath sounds.  Chest:     Chest wall: No tenderness.  Abdominal:     General: Abdomen is flat. Bowel sounds are normal.     Palpations: Abdomen is soft.     Comments: Obese.  No HSM  Musculoskeletal:        General: No swelling. Normal range of motion.     Cervical back: Normal range of motion.  Neurological:     General: No focal deficit present.     Mental Status: She is alert and oriented to person, place, and time.  Psychiatric:        Mood and Affect: Mood normal.        Behavior: Behavior normal.        Thought Content: Thought content normal.        Judgment: Judgment normal.     Adult ECG Report  Rate: 60 ;  Rhythm: normal sinus rhythm and ED with incomplete RBBB.;  Cannot exclude inferior MI, age-indeterminate.  Narrative Interpretation: Relatively stable  Recent Labs: Lipids not available from after 2017 No results found for: CHOL, HDL, LDLCALC, LDLDIRECT, TRIG, CHOLHDL Lab Results  Component Value Date   CREATININE 0.83 06/29/2019   BUN 20 06/29/2019   NA 138 06/29/2019   K 4.4 06/29/2019   CL 104 06/29/2019   CO2 26 06/29/2019   No results found for: TSH  ASSESSMENT/PLAN    Problem List Items Addressed This Visit    [place] coronary artery calcium score between 200 and 399 (Chronic)    She does have pretty significant coronary calcium score now is having worsening dyspnea and fatigue on exertion, now  with some chest tightness.  Need to exclude ischemic CAD at this point: Plan: Coronary CTA with CT FFR.      Relevant Orders   EKG 12-Lead (Completed)  CT CORONARY MORPH W/CTA COR W/SCORE W/CA W/CM &/OR WO/CM   CT CORONARY FRACTIONAL FLOW RESERVE DATA PREP   CT CORONARY FRACTIONAL FLOW RESERVE FLUID ANALYSIS   Essential hypertension (Chronic)    Blood pressure is borderline high today, but she is not feeling well.  At this point I would recommend adjust medications until we see results of coronary CTA.  For now we will have her hold diltiazem since there is still the issue with fatigue.  Potentially may convert to a different beta-blocker such as bisoprolol going forward but need to see results of testing.      PVC's (premature ventricular contractions) (Chronic)    She did not tolerate being off Toprol.  Palpitations are better on diltiazem but now she still having fatigue and exertional dyspnea.  For now, see how she does off of any medications.  We can see what she does on her coronary CTA and then determine whether or not we put her back on a different beta-blocker or back to diltiazem.      Relevant Orders   EKG 12-Lead (Completed)   CT CORONARY MORPH W/CTA COR W/SCORE W/CA W/CM &/OR WO/CM   CT CORONARY FRACTIONAL FLOW RESERVE DATA PREP   CT CORONARY FRACTIONAL FLOW RESERVE FLUID ANALYSIS   PAC (premature atrial contraction) (Chronic)    For now we will hold diltiazem and determine the next best option after coronary CTA.      Dyspnea   Relevant Orders   EKG 12-Lead (Completed)   CT CORONARY MORPH W/CTA COR W/SCORE W/CA W/CM &/OR WO/CM   CT CORONARY FRACTIONAL FLOW RESERVE DATA PREP   CT CORONARY FRACTIONAL FLOW RESERVE FLUID ANALYSIS   Chest pain, precordial    She had some sort of pain in the past, now she is having this aching fatigue sensation in her chest that given her elevated coronary calcium score, I think is reasonable to evaluate for ischemia with a coronary  CTA.      Feeling of chest tightness - Primary    Chest tightness that is with and without exertion, but more noted with exertion.  Cannot exclude ischemic CAD.  She does have an elevated coronary calcium score.  Plan: Coronary CTA.      Relevant Orders   EKG 12-Lead (Completed)   CT CORONARY MORPH W/CTA COR W/SCORE W/CA W/CM &/OR WO/CM   CT CORONARY FRACTIONAL FLOW RESERVE DATA PREP   CT CORONARY FRACTIONAL FLOW RESERVE FLUID ANALYSIS    Other Visit Diagnoses    Pre-procedural cardiovascular examination       Relevant Orders   Basic metabolic panel       [id number] Education: The signs and symptoms of COVID-19 were discussed with the patient and how to seek care for testing (follow up with PCP or arrange E-visit).   The importance of social distancing and COVID-19 vaccination was discussed today.  I spent a total of 82mnutes with the patient. >  50% of the time was spent in direct patient consultation.  Additional time spent with chart review  / charting (studies, outside notes, etc): 8 Total Time: 32 min   Current medicines are reviewed at length with the patient today.  (+/- concerns) Still with fatigue & chest tightness - not sure of Medication effect   WILL WEAN OFF OF DILTIAZEM TO DETERMINE MEDICATION EFFECT OR OTHER.  Notice: This dictation was prepared with Dragon dictation along with smaller phrase technology. Any transcriptional errors that result from this process are unintentional and  may not be corrected upon review.  Patient Instructions / Medication Changes & Studies & Tests Ordered   Patient Instructions  Medication Instructions:   CONTINUE Diltiazem every other day for 1 week then STOP  Metoprolol Tartrate (Lopressor) 50 mg 2 hours prior to Coronary CTA  *If you need a refill on your cardiac medications before your next appointment, please call your pharmacy*  Lab Work: Your physician recommends that you return for lab work in:   BMET If you have  labs (blood work) drawn today and your tests are completely normal, you will receive your results only by: Marland Kitchen MyChart Message (if you have MyChart) OR . A paper copy in the mail If you have any lab test that is abnormal or we need to change your treatment, we will call you to review the results.   Testing/Procedures: Your physician has requested that you have cardiac CT. Cardiac computed tomography (CT) is a painless test that uses an x-ray machine to take clear, detailed pictures of your heart. Cardiac CT Angiogram A cardiac CT angiogram is a procedure to look at the heart and the area around the heart. It may be done to help find the cause of chest pains or other symptoms of heart disease. During this procedure, a substance called contrast dye is injected into the blood vessels in the area to be checked. A large X-ray machine, called a CT scanner, then takes detailed pictures of the heart and the surrounding area. The procedure is also sometimes called a coronary CT angiogram, coronary artery scanning, or CTA. A cardiac CT angiogram allows the health care provider to see how well blood is flowing to and from the heart. The health care provider will be able to see if there are any problems, such as:  Blockage or narrowing of the coronary arteries in the heart.  Fluid around the heart.  Signs of weakness or disease in the muscles, valves, and tissues of the heart. Tell a health care provider about:  Any allergies you have. This is especially important if you have had a previous allergic reaction to contrast dye.  All medicines you are taking, including vitamins, herbs, eye drops, creams, and over-the-counter medicines.  Any blood disorders you have.  Any surgeries you have had.  Any medical conditions you have.  Whether you are pregnant or may be pregnant.  Any anxiety disorders, chronic pain, or other conditions you have that may increase your stress or prevent you from lying  still. What are the risks? Generally, this is a safe procedure. However, problems may occur, including:  Bleeding.  Infection.  Allergic reactions to medicines or dyes.  Damage to other structures or organs.  Kidney damage from the contrast dye that is used.  Increased risk of cancer from radiation exposure. This risk is low. Talk with your health care provider about: ? The risks and benefits of testing. ? How you can receive the lowest dose of radiation. What happens before the procedure?  Wear comfortable clothing and remove any jewelry, glasses, dentures, and hearing aids.  Follow instructions from your health care provider about eating and drinking. This may include: ? For 12 hours before the procedure -- avoid caffeine. This includes tea, coffee, soda, energy drinks, and diet pills. Drink plenty of water or other fluids that do not have caffeine in them. Being well hydrated can prevent complications. ? For 4-6 hours before the procedure -- stop eating and drinking. The contrast dye can cause nausea, but this  is less likely if your stomach is empty.  Ask your health care provider about changing or stopping your regular medicines. This is especially important if you are taking diabetes medicines, blood thinners, or medicines to treat problems with erections (erectile dysfunction). What happens during the procedure?   Hair on your chest may need to be removed so that small sticky patches called electrodes can be placed on your chest. These will transmit information that helps to monitor your heart during the procedure.  An IV will be inserted into one of your veins.  You might be given a medicine to control your heart rate during the procedure. This will help to ensure that good images are obtained.  You will be asked to lie on an exam table. This table will slide in and out of the CT machine during the procedure.  Contrast dye will be injected into the IV. You might feel warm,  or you may get a metallic taste in your mouth.  You will be given a medicine called nitroglycerin. This will relax or dilate the arteries in your heart.  The table that you are lying on will move into the CT machine tunnel for the scan.  The person running the machine will give you instructions while the scans are being done. You may be asked to: ? Keep your arms above your head. ? Hold your breath. ? Stay very still, even if the table is moving.  When the scanning is complete, you will be moved out of the machine.  The IV will be removed. The procedure may vary among health care providers and hospitals. What can I expect after the procedure? After your procedure, it is common to have:  A metallic taste in your mouth from the contrast dye.  A feeling of warmth.  A headache from the nitroglycerin. Follow these instructions at home:  Take over-the-counter and prescription medicines only as told by your health care provider.  If you are told, drink enough fluid to keep your urine pale yellow. This will help to flush the contrast dye out of your body.  Most people can return to their normal activities right after the procedure. Ask your health care provider what activities are safe for you.  It is up to you to get the results of your procedure. Ask your health care provider, or the department that is doing the procedure, when your results will be ready.  Keep all follow-up visits as told by your health care provider. This is important. Contact a health care provider if:  You have any symptoms of allergy to the contrast dye. These include: ? Shortness of breath. ? Rash or hives. ? A racing heartbeat. Summary  A cardiac CT angiogram is a procedure to look at the heart and the area around the heart. It may be done to help find the cause of chest pains or other symptoms of heart disease.  During this procedure, a large X-ray machine, called a CT scanner, takes detailed pictures of  the heart and the surrounding area after a contrast dye has been injected into blood vessels in the area.  Ask your health care provider about changing or stopping your regular medicines before the procedure. This is especially important if you are taking diabetes medicines, blood thinners, or medicines to treat erectile dysfunction.  If you are told, drink enough fluid to keep your urine pale yellow. This will help to flush the contrast dye out of your body. This information is not  intended to replace advice given to you by your health care provider. Make sure you discuss any questions you have with your health care provider. Document Revised: 09/17/2018 Document Reviewed: 09/17/2018 Elsevier Patient Education  [place]: At [facility], you and your health needs are our priority.  As part of our continuing mission to provide you with exceptional heart care, we have created designated Provider Care Teams.  These Care Teams include your primary Cardiologist (physician) and Advanced Practice Providers (APPs -  Physician Assistants and Nurse Practitioners) who all work together to provide you with the care you need, when you need it.  Your next appointment:   Follow up after Coronary CTA   The format for your next appointment:   In Person  Provider:   Glenetta Hew, MD  Other Instructions    Your cardiac CT will be scheduled at one of the below locations:   [facility] [address] [place], [place] [address] [phone]  [address] [place], [place] [address] [phone]  If scheduled at [facility], please arrive at the [facility] main entrance of [facility] [place] 30 minutes prior to test start time. Proceed to the [facility] Radiology Department (first floor) to check-in and test prep.  If scheduled at [facility], please arrive 15  mins early for check-in and test prep.  Please follow these instructions carefully (unless otherwise directed):  Hold all erectile dysfunction medications at least 3 days (72 hrs) prior to test.  On the Night Before the Test: . Be sure to Drink plenty of water. . Do not consume any caffeinated/decaffeinated beverages or chocolate 12 hours prior to your test. . Do not take any antihistamines 12 hours prior to your test. . If the patient has contrast allergy: ? Patient will need a prescription for Prednisone and very clear instructions (as follows): 1. Prednisone 50 mg - take 13 hours prior to test 2. Take another Prednisone 50 mg 7 hours prior to test 3. Take another Prednisone 50 mg 1 hour prior to test 4. Take Benadryl 50 mg 1 hour prior to test . Patient must complete all four doses of above prophylactic medications. . Patient will need a ride after test due to Benadryl.  On the Day of the Test: . Drink plenty of water. Do not drink any water within one hour of the test. . Do not eat any food 4 hours prior to the test. . You may take your regular medications prior to the test.  . Take metoprolol (Lopressor) two hours prior to test. . HOLD Furosemide/Hydrochlorothiazide morning of the test. . FEMALES- please wear underwire-free bra if available        -Drink plenty of water       -Hold Furosemide/hydrochlorothiazide morning of the test       -Take metoprolol (Lopressor) 2 hours prior to test (if applicable).                  -If HR is less than 55 BPM- No Lopressor                -IF HR is greater than 55 BPM and patient is less than or equal to 26 yrs old Lopressor 175m x1.                -If HR is greater than 55 BPM and patient is greater than  39 yrs old Lopressor 50 mg x1.     Do not give Lopressor to patients with an allergy to lopressor or anyone with asthma or active COPD symptoms (currently taking steroids).       After the Test: . Drink plenty of water. . After  receiving IV contrast, you may experience a mild flushed feeling. This is normal. . On occasion, you may experience a mild rash up to 24 hours after the test. This is not dangerous. If this occurs, you can take Benadryl 25 mg and increase your fluid intake. . If you experience trouble breathing, this can be serious. If it is severe call 911 IMMEDIATELY. If it is mild, please call our office. . If you take any of these medications: Glipizide/Metformin, Avandament, Glucavance, please do not take 48 hours after completing test unless otherwise instructed.   Once we have confirmed authorization from your insurance company, we will call you to set up a date and time for your test. Based on how quickly your insurance processes prior authorizations requests, please allow up to 4 weeks to be contacted for scheduling your Cardiac CT appointment. Be advised that routine Cardiac CT appointments could be scheduled as many as 8 weeks after your provider has ordered it.  For non-scheduling related questions, please contact the cardiac imaging nurse navigator should you have any questions/concerns: Marchia Bond, Cardiac Imaging Nurse Navigator Burley Saver, Interim Cardiac Imaging Nurse Dasher and Vascular Services Direct Office Dial: [phone]   For scheduling needs, including cancellations and rescheduling, please call Vivien Rota at [phone], option 3.        Studies Ordered:   Orders Placed This Encounter  Procedures  . CT CORONARY MORPH W/CTA COR W/SCORE W/CA W/CM &/OR WO/CM  . CT CORONARY FRACTIONAL FLOW RESERVE DATA PREP  . CT CORONARY FRACTIONAL FLOW RESERVE FLUID ANALYSIS  . Basic metabolic panel  . EKG 12-Lead     Glenetta Hew, M.D., M.S. Interventional Cardiologist   Pager # [phone] Phone # [phone] [address] [place], [place] [address]   Thank you for choosing Heartcare at [facility]!!

## 2019-09-08 ENCOUNTER — Ambulatory Visit: Payer: Self-pay

## 2019-09-08 ENCOUNTER — Other Ambulatory Visit: Payer: Self-pay

## 2019-09-08 ENCOUNTER — Ambulatory Visit (INDEPENDENT_AMBULATORY_CARE_PROVIDER_SITE_OTHER): Payer: Medicare Other | Admitting: Physician Assistant

## 2019-09-08 ENCOUNTER — Encounter: Payer: Self-pay | Admitting: Physician Assistant

## 2019-09-08 VITALS — BP 128/72 | HR 64 | Resp 15 | Ht 61.5 in | Wt 192.0 lb

## 2019-09-08 DIAGNOSIS — Z8719 Personal history of other diseases of the digestive system: Secondary | ICD-10-CM

## 2019-09-08 DIAGNOSIS — M79642 Pain in left hand: Secondary | ICD-10-CM | POA: Diagnosis not present

## 2019-09-08 DIAGNOSIS — M0609 Rheumatoid arthritis without rheumatoid factor, multiple sites: Secondary | ICD-10-CM | POA: Diagnosis not present

## 2019-09-08 DIAGNOSIS — M79672 Pain in left foot: Secondary | ICD-10-CM

## 2019-09-08 DIAGNOSIS — Z981 Arthrodesis status: Secondary | ICD-10-CM | POA: Diagnosis not present

## 2019-09-08 DIAGNOSIS — Z8639 Personal history of other endocrine, nutritional and metabolic disease: Secondary | ICD-10-CM

## 2019-09-08 DIAGNOSIS — M79671 Pain in right foot: Secondary | ICD-10-CM | POA: Diagnosis not present

## 2019-09-08 DIAGNOSIS — M5030 Other cervical disc degeneration, unspecified cervical region: Secondary | ICD-10-CM

## 2019-09-08 DIAGNOSIS — M1712 Unilateral primary osteoarthritis, left knee: Secondary | ICD-10-CM | POA: Diagnosis not present

## 2019-09-08 DIAGNOSIS — Z79899 Other long term (current) drug therapy: Secondary | ICD-10-CM

## 2019-09-08 DIAGNOSIS — Z8679 Personal history of other diseases of the circulatory system: Secondary | ICD-10-CM

## 2019-09-08 DIAGNOSIS — M533 Sacrococcygeal disorders, not elsewhere classified: Secondary | ICD-10-CM

## 2019-09-08 DIAGNOSIS — M79641 Pain in right hand: Secondary | ICD-10-CM | POA: Diagnosis not present

## 2019-09-08 DIAGNOSIS — M5136 Other intervertebral disc degeneration, lumbar region: Secondary | ICD-10-CM

## 2019-09-08 DIAGNOSIS — Z96651 Presence of right artificial knee joint: Secondary | ICD-10-CM

## 2019-09-08 DIAGNOSIS — Z9889 Other specified postprocedural states: Secondary | ICD-10-CM

## 2019-09-10 ENCOUNTER — Encounter: Payer: Self-pay | Admitting: Cardiology

## 2019-09-10 NOTE — Assessment & Plan Note (Signed)
She had some sort of pain in the past, now she is having this aching fatigue sensation in her chest that given her elevated coronary calcium score, I think is reasonable to evaluate for ischemia with a coronary CTA.

## 2019-09-10 NOTE — Assessment & Plan Note (Signed)
She did not tolerate being off Toprol.  Palpitations are better on diltiazem but now she still having fatigue and exertional dyspnea.  For now, see how she does off of any medications.  We can see what she does on her coronary CTA and then determine whether or not we put her back on a different beta-blocker or back to diltiazem.

## 2019-09-10 NOTE — Assessment & Plan Note (Signed)
Blood pressure is borderline high today, but she is not feeling well.  At this point I would recommend adjust medications until we see results of coronary CTA.  For now we will have her hold diltiazem since there is still the issue with fatigue.  Potentially may convert to a different beta-blocker such as bisoprolol going forward but need to see results of testing.

## 2019-09-10 NOTE — Assessment & Plan Note (Signed)
She does have pretty significant coronary calcium score now is having worsening dyspnea and fatigue on exertion, now with some chest tightness.  Need to exclude ischemic CAD at this point: Plan: Coronary CTA with CT FFR.

## 2019-09-10 NOTE — Assessment & Plan Note (Signed)
Chest tightness that is with and without exertion, but more noted with exertion.  Cannot exclude ischemic CAD.  She does have an elevated coronary calcium score.  Plan: Coronary CTA.

## 2019-09-10 NOTE — Assessment & Plan Note (Signed)
For now we will hold diltiazem and determine the next best option after coronary CTA.

## 2019-09-16 ENCOUNTER — Other Ambulatory Visit: Payer: Self-pay | Admitting: *Deleted

## 2019-09-16 DIAGNOSIS — R072 Precordial pain: Secondary | ICD-10-CM

## 2019-09-16 DIAGNOSIS — I493 Ventricular premature depolarization: Secondary | ICD-10-CM

## 2019-09-16 DIAGNOSIS — R0609 Other forms of dyspnea: Secondary | ICD-10-CM

## 2019-09-16 DIAGNOSIS — R931 Abnormal findings on diagnostic imaging of heart and coronary circulation: Secondary | ICD-10-CM

## 2019-09-16 DIAGNOSIS — R0789 Other chest pain: Secondary | ICD-10-CM

## 2019-09-22 ENCOUNTER — Telehealth: Payer: Self-pay | Admitting: Cardiology

## 2019-09-22 NOTE — Telephone Encounter (Signed)
Kiara Miller is calling upset and crying requesting a call from Jasmine December in regards to her CT. She states there is an issue with her diagnosis code and she has been trying to get it resolved for the past 3 weeks. She states she is "reaching the end of her rope". Please advise.

## 2019-09-22 NOTE — Telephone Encounter (Signed)
-----   Message -----  From: Marylynn Pearson  Sent: 09/22/2019  7:40 AM EDT  To: Marykay Lex, MD, April H Pait, *  Subject: Ct heart                     I am still getting a notice that the diagnosis is not covered with patient's [facility]   April, please advise what I should do? Patient is wanting this scheduled

## 2019-09-22 NOTE — Progress Notes (Signed)
Orders cancelled.

## 2019-09-22 NOTE — Telephone Encounter (Signed)
Spoke with pt, she is very upset about not getting the CTA scheduled and does not understand what is going on. Discussed with Sheralyn Boatman and the patient is aware her insurance may not pay for the CTA. Sheralyn Boatman to call and schedule CTA. Left message for patient to make sure she had no further questions.

## 2019-09-22 NOTE — Telephone Encounter (Signed)
CT scheduled 09/28/19

## 2019-09-23 LAB — BASIC METABOLIC PANEL
BUN/Creatinine Ratio: 26 (ref 12–28)
BUN: 24 mg/dL (ref 8–27)
CO2: 25 mmol/L (ref 20–29)
Calcium: 9.9 mg/dL (ref 8.7–10.3)
Chloride: 101 mmol/L (ref 96–106)
Creatinine, Ser: 0.92 mg/dL (ref 0.57–1.00)
GFR calc Af Amer: 73 mL/min/{1.73_m2} (ref >59)
GFR calc non Af Amer: 64 mL/min/{1.73_m2} (ref >59)
Glucose: 102 mg/dL — ABNORMAL HIGH (ref 65–99)
Potassium: 4.5 mmol/L (ref 3.5–5.2)
Sodium: 138 mmol/L (ref 134–144)

## 2019-09-25 ENCOUNTER — Telehealth (HOSPITAL_COMMUNITY): Payer: Self-pay | Admitting: Emergency Medicine

## 2019-09-25 NOTE — Telephone Encounter (Signed)
Reaching out to patient to offer assistance regarding upcoming cardiac imaging study; pt verbalizes understanding of appt date/time, parking situation and where to check in, pre-test NPO status and medications ordered, and verified current allergies; name and call back number provided for further questions should they arise Bilaal Leib RN Navigator Cardiac Imaging [facility] [phone] office [phone] cell 

## 2019-09-28 ENCOUNTER — Ambulatory Visit (HOSPITAL_COMMUNITY)
Admission: RE | Admit: 2019-09-28 | Discharge: 2019-09-28 | Disposition: A | Discharge status: Home / Self Care | Payer: Medicare Other | Source: Ambulatory Visit | Attending: Cardiology | Admitting: Cardiology

## 2019-09-28 ENCOUNTER — Other Ambulatory Visit: Payer: Self-pay

## 2019-09-28 DIAGNOSIS — R0609 Other forms of dyspnea: Secondary | ICD-10-CM

## 2019-09-28 DIAGNOSIS — I493 Ventricular premature depolarization: Secondary | ICD-10-CM | POA: Diagnosis present

## 2019-09-28 DIAGNOSIS — R0789 Other chest pain: Secondary | ICD-10-CM | POA: Diagnosis present

## 2019-09-28 DIAGNOSIS — R0600 Dyspnea, unspecified: Secondary | ICD-10-CM | POA: Insufficient documentation

## 2019-09-28 DIAGNOSIS — R931 Abnormal findings on diagnostic imaging of heart and coronary circulation: Secondary | ICD-10-CM | POA: Diagnosis present

## 2019-09-28 DIAGNOSIS — I2510 Atherosclerotic heart disease of native coronary artery without angina pectoris: Secondary | ICD-10-CM | POA: Diagnosis not present

## 2019-09-28 DIAGNOSIS — I208 Other forms of angina pectoris: Secondary | ICD-10-CM | POA: Diagnosis present

## 2019-09-28 DIAGNOSIS — R072 Precordial pain: Secondary | ICD-10-CM | POA: Insufficient documentation

## 2019-09-28 MED ORDER — NITROGLYCERIN 0.4 MG SL SUBL
SUBLINGUAL_TABLET | SUBLINGUAL | Status: AC
  Filled 2019-09-28: qty 2

## 2019-09-28 MED ORDER — NITROGLYCERIN 0.4 MG SL SUBL
0.8000 mg | SUBLINGUAL_TABLET | Freq: Once | SUBLINGUAL | Status: AC
  Administered 2019-09-28: 0.8 mg via SUBLINGUAL

## 2019-09-28 MED ORDER — IOHEXOL 350 MG/ML SOLN
80.0000 mL | Freq: Once | INTRAVENOUS | Status: AC | PRN
  Administered 2019-09-28: 80 mL via INTRAVENOUS

## 2019-09-28 NOTE — Progress Notes (Signed)
CT scan completed. Tolerated well. D/c home ambulatory with husband. Awake and alert. In no distress 

## 2019-09-29 ENCOUNTER — Other Ambulatory Visit: Payer: Self-pay | Admitting: Cardiology

## 2019-09-29 ENCOUNTER — Ambulatory Visit (HOSPITAL_COMMUNITY)
Admission: RE | Admit: 2019-09-29 | Discharge: 2019-09-29 | Disposition: A | Discharge status: Home / Self Care | Payer: Medicare Other | Source: Ambulatory Visit | Attending: Cardiology | Admitting: Cardiology

## 2019-09-29 DIAGNOSIS — I493 Ventricular premature depolarization: Secondary | ICD-10-CM | POA: Insufficient documentation

## 2019-09-29 DIAGNOSIS — R079 Chest pain, unspecified: Secondary | ICD-10-CM

## 2019-09-29 DIAGNOSIS — R0600 Dyspnea, unspecified: Secondary | ICD-10-CM | POA: Diagnosis not present

## 2019-09-29 DIAGNOSIS — Z6835 Body mass index (BMI) 35.0-35.9, adult: Secondary | ICD-10-CM

## 2019-09-29 DIAGNOSIS — R931 Abnormal findings on diagnostic imaging of heart and coronary circulation: Secondary | ICD-10-CM | POA: Diagnosis not present

## 2019-09-29 DIAGNOSIS — R072 Precordial pain: Secondary | ICD-10-CM

## 2019-09-29 DIAGNOSIS — R0789 Other chest pain: Secondary | ICD-10-CM | POA: Diagnosis not present

## 2019-09-29 DIAGNOSIS — I208 Other forms of angina pectoris: Secondary | ICD-10-CM

## 2019-09-29 DIAGNOSIS — R0609 Other forms of dyspnea: Secondary | ICD-10-CM

## 2019-09-30 ENCOUNTER — Telehealth: Payer: Self-pay | Admitting: Cardiology

## 2019-09-30 MED ORDER — NITROGLYCERIN 0.4 MG SL SUBL: 0.4000 mg | SUBLINGUAL_TABLET | SUBLINGUAL | 3 refills | Status: AC | PRN

## 2019-09-30 NOTE — Telephone Encounter (Signed)
Attempted to call x2. Pt's phone was having issues connecting. Sent her a Wellsite geologist. Awaiting response.

## 2019-09-30 NOTE — Telephone Encounter (Signed)
Spoke with pt regarding Dr. Elissa Hefty recommendations. She has verbalized understanding and had no additional questions.

## 2019-09-30 NOTE — Telephone Encounter (Signed)
Kiara Miller is calling requesting a nurse call her to discuss her CT results. Please advise.

## 2019-09-30 NOTE — Telephone Encounter (Signed)
Pt calling today to discuss her results from Cardiac CT. Per Dr. Herbie Baltimore, he would like her seen to be set up for a Cath by Monday Aug 30. She states since she has had her CT, her symptoms have worsened but will subside with rest. She does not have a prescription for nitro.   She has agreed to see Edd Fabian, NP tomorrow 8/26 for evaluation and to be set up for a cath. I will request a prescription for PRN nitro from Dr. Herbie Baltimore. The patient and I discussed how to use the nitro and/or when to call 911 if her CP worsens. She should not wait for her appt or her heart cath to call 911 if her sx continue to worsen.  She had no additional concerns at this time.

## 2019-09-30 NOTE — Telephone Encounter (Signed)
Pls provide SL NTG Rx - 0.4 mg SL q 5 min x 3 (but call EMS if uses 2nd dose after 5 min).  Bryan Lemma, MD

## 2019-09-30 NOTE — Addendum Note (Signed)
Addended by: Oretha Milch on: 09/30/2019 03:25 PM   Modules accepted: Orders

## 2019-10-01 ENCOUNTER — Other Ambulatory Visit: Payer: Self-pay

## 2019-10-01 ENCOUNTER — Ambulatory Visit (INDEPENDENT_AMBULATORY_CARE_PROVIDER_SITE_OTHER): Payer: Medicare Other | Admitting: General Practice

## 2019-10-01 ENCOUNTER — Encounter: Payer: Self-pay | Admitting: General Practice

## 2019-10-01 ENCOUNTER — Other Ambulatory Visit (HOSPITAL_COMMUNITY)
Admission: RE | Admit: 2019-10-01 | Discharge: 2019-10-01 | Disposition: A | Discharge status: Home / Self Care | Payer: Medicare Other | Source: Ambulatory Visit | Attending: Cardiology | Admitting: Cardiology

## 2019-10-01 VITALS — BP 144/66 | HR 61 | Ht 61.5 in | Wt 189.2 lb

## 2019-10-01 DIAGNOSIS — Z20822 Contact with and (suspected) exposure to covid-19: Secondary | ICD-10-CM | POA: Diagnosis not present

## 2019-10-01 DIAGNOSIS — Z79899 Other long term (current) drug therapy: Secondary | ICD-10-CM | POA: Diagnosis not present

## 2019-10-01 DIAGNOSIS — Z01812 Encounter for preprocedural laboratory examination: Secondary | ICD-10-CM | POA: Insufficient documentation

## 2019-10-01 DIAGNOSIS — I10 Essential (primary) hypertension: Secondary | ICD-10-CM | POA: Diagnosis not present

## 2019-10-01 NOTE — Patient Instructions (Signed)
Medication Instructions:  The current medical regimen is effective;  continue present plan and medications as directed. Please refer to the Current Medication list given to you today. *If you need a refill on your cardiac medications before your next appointment, please call your pharmacy*  Lab Work: CBC AND CMET TODAY If you have labs (blood work) drawn today and your tests are completely normal, you will receive your results only by:  MyChart Message (if you have MyChart) OR A paper copy in the mail.  If you have any lab test that is abnormal or we need to change your treatment, we will call you to review the results. You may go to any [facility] that is convenient for you however, we do have a lab in our office that is able to assist you. You DO NOT need an appointment for our lab. The lab is open 8:00am and closes at 4:00pm. Lunch 12:45 - 1:45pm.  Special Instructions OK TO HAVE CATH Monday  Follow-Up: Your next appointment:  AFTER CATH   In Person with You may see Bryan Lemma, MD or one of the following Advanced Practice Providers on your designated Care Team:  Theodore Demark, PA-C  Joni Reining, DNP, ANP  Cadence Fransico Michael, PA-C -OR- JESSE CLEAVER, FNP-C  At [facility], you and your health needs are our priority.  As part of our continuing mission to provide you with exceptional heart care, we have created designated Provider Care Teams.  These Care Teams include your primary Cardiologist (physician) and Advanced Practice Providers (APPs -  Physician Assistants and Nurse Practitioners) who all work together to provide you with the care you need, when you need it.

## 2019-10-01 NOTE — Progress Notes (Signed)
Cardiology Clinic Note   Patient Name: Kiara Miller Date of Encounter: 10/01/2019  Primary Care Provider:  Tracey Harries, MD Primary Cardiologist:  Bryan Lemma, MD  Patient Profile    [name] 69 year old female presents to the clinic today for precath cardiology visit.  Past Medical History    Past Medical History:  Diagnosis Date  . Diverticulitis April 2017  . Hyperlipidemia   . Hypertension   . Joint pain   . Osteoarthritis   . Polyarthralgia   . Rheumatoid arthritis (HCC)   . Right knee pain    Past Surgical History:  Procedure Laterality Date  . ABDOMINAL HYSTERECTOMY    . CARPAL TUNNEL RELEASE    . KNEE ARTHROPLASTY    . MANDIBLE SURGERY    . NM MYOVIEW LTD  06/2017   (Ordered for coronary calcium score 376) LOW RISK.  EF 72%.  No ischemia or infarction.  No wall motion normality.  No change from previous.  . OSTEOTOMY PELVIS BILATERAL    . TONSILLECTOMY    . TONSILLECTOMY    . TOTAL KNEE ARTHROPLASTY Right 09/02/2018   Procedure: TOTAL KNEE ARTHROPLASTY;  Surgeon: Durene Romans, MD;  Location: [facility];  Service: Orthopedics;  Laterality: Right;  70 mins  . TRANSTHORACIC ECHOCARDIOGRAM  12/2010   EF 65-70%. Mild LVH. No RWMA/ Gr1 DD.  . TUBAL LIGATION    . WRIST FUSION      Allergies  Allergies  Allergen Reactions  . Clindamycin/Lincomycin Other (See Comments)    Severe esophagitis  . Codeine Itching  . Leflunomide Nausea Only  . Penicillins Itching    Did it involve swelling of the face/tongue/throat, SOB, or low BP? No Did it involve sudden or severe rash/hives, skin peeling, or any reaction on the inside of your mouth or nose? No Did you need to seek medical attention at a hospital or doctor's office? No When did it last happen?2010 If all above answers are "NO", may proceed with cephalosporin use.   . Statins     Muscle weakness  . Tetracyclines & Related Nausea And Vomiting  . Levofloxacin Nausea Only and Palpitations     History of Present Illness    Kiara Miller has a past medical history of PVCs, coronary artery disease these, essential hypertension, PACs, palpitations, dyspnea, precordial chest pain, and chronic fatigue.  She presented to the cardiology clinic 09/04/2019 and was evaluated by Dr. Herbie Baltimore.  She was previously seen by Dr. Herbie Baltimore 7/21 with complaints of heart palpitations while trying to reduce her beta-blocker.  Heart rates were noted to be in the 50s with palpitations.  Her physical activity was limited by hip and knee pain.  She was weaned off metoprolol and reassessed due to an increase in her fatigue.  She she went back on metoprolol before completing her taper.  It was felt that she had not been off of enough of the medication to notice a difference in her fatigue.  She was also noted to have dizziness and nausea while on Plaquenil.  She also had some associated dyspnea.  She did not have any active angina at the time.  Her beta-blocker was converted to diltiazem 120 mg.  It was unsure if her lack of energy was related to her rheumatoid arthritis versus cardiovascular issues.  Her EKG showed normal sinus rhythm with incomplete right bundle branch block inferior MI undetermined age 5 bpm.  She underwent coronary CTA on 09/28/2019 which showed LAD stenosis of 50-69%.  FFR was completed and showed 0.66 flow and LAD.  She presents to the clinic today with her husband for preop/precath evaluation and states for several months she has been increasingly short of breath.  She states that she was only able to ambulate around her house.  She underwent total knee replacement 08/2018 and feels that since that time she has not been able to recover but this.  She is still somewhat limited by her left ankle pain.  She has continued a high-fiber low carbohydrate diet.  Her coronary CTA and FFR results were reviewed.  She was given opportunity to ask questions about her upcoming cardiac catheterization.  Her risks and  the procedure were reviewed.  She expressed understanding.  Her blood pressure is slightly increased in the office today however, she monitors her blood pressure at home and has been running 120s over 60s.  I will have her follow-up with cardiology after her cardiac catheterization.  Today she denies increased chest pain, increased shortness of breath, lower extremity edema, fatigue, palpitations, melena, hematuria, hemoptysis, diaphoresis, weakness, presyncope, syncope, orthopnea, and PND.   Home Medications    Prior to Admission medications   Medication Sig Start Date End Date Taking? Authorizing Provider  cephALEXin (KEFLEX) 500 MG capsule Take 2,000 mg by mouth See admin instructions. Take 2000 mg 1 hour prior to dental work 01/23/19   [provider]  ibuprofen (ADVIL) 200 MG tablet Take 800 mg by mouth 2 (two) times daily as needed for headache or moderate pain.    [provider]  ketoconazole (NIZORAL) 2 % cream Apply 1 application topically daily as needed for irritation.    [provider]  levothyroxine (SYNTHROID, LEVOTHROID) 75 MCG tablet Take 75 mcg by mouth daily before breakfast.    [provider]  losartan-hydrochlorothiazide (HYZAAR) 100-25 MG per tablet Take 1 tablet by mouth daily. 12/05/10   Nahser, Philip J, MD  metoprolol succinate (TOPROL-XL) 25 MG 24 hr tablet Take 12.5 mg by mouth daily.    [provider]  montelukast (SINGULAIR) 10 MG tablet Take 10 mg by mouth at bedtime.      [provider]  nitroGLYCERIN (NITROSTAT) 0.4 MG SL tablet Place 1 tablet (0.4 mg total) under the tongue every 5 (five) minutes as needed for chest pain. Call EMS if CP is not relieved after 2 doses. You may take up to 3 doses. 09/30/19 12/29/19  Harding, David W, MD  Omega-3 Fatty Acids (FISH OIL) 1200 MG CAPS Take 1,200 mg by mouth daily.     [provider]  Vitamin D, Ergocalciferol, (DRISDOL) 50000 UNITS CAPS capsule Take 50,000  Units by mouth every Wednesday.     [provider]    Family History    Family History  Problem Relation Age of Onset  . Hypertension Father   . Lung disease Brother    She indicated that her mother is deceased. She indicated that her father is deceased. She indicated that her sister is alive. She indicated that her brother is deceased. She indicated that her son is alive.  Social History    Social History   Socioeconomic History  . Marital status: Married    Spouse name: Not on file  . Number of children: Not on file  . Years of education: Not on file  . Highest education level: Not on file  Occupational History  . Not on file  Tobacco Use  . Smoking status: Former Smoker    Packs/day:   0.50    Years: 48.00    Pack years: 24.00    Types: Cigarettes    Quit date: 08/18/2018    Years since quitting: 1.1  . Smokeless tobacco: Never Used  Vaping Use  . Vaping Use: Never used  Substance and Sexual Activity  . Alcohol use: No  . Drug use: No  . Sexual activity: Not on file  Other Topics Concern  . Not on file  Social History Narrative  . Not on file   Social Determinants of Health   Financial Resource Strain:   . Difficulty of Paying Living Expenses: Not on file  Food Insecurity:   . Worried About Programme researcher, broadcasting/film/video in the Last Year: Not on file  . Ran Out of Food in the Last Year: Not on file  Transportation Needs:   . Lack of Transportation (Medical): Not on file  . Lack of Transportation (Non-Medical): Not on file  Physical Activity:   . Days of Exercise per Week: Not on file  . Minutes of Exercise per Session: Not on file  Stress:   . Feeling of Stress : Not on file  Social Connections:   . Frequency of Communication with Friends and Family: Not on file  . Frequency of Social Gatherings with Friends and Family: Not on file  . Attends Religious Services: Not on file  . Active Member of Clubs or Organizations: Not on file  . Attends Tax inspector Meetings: Not on file  . Marital Status: Not on file  Intimate Partner Violence:   . Fear of Current or Ex-Partner: Not on file  . Emotionally Abused: Not on file  . Physically Abused: Not on file  . Sexually Abused: Not on file     Review of Systems    General:  No chills, fever, night sweats or weight changes.  Cardiovascular:  No chest pain, dyspnea on exertion, edema, orthopnea, palpitations, paroxysmal nocturnal dyspnea. Dermatological: No rash, lesions/masses Respiratory: No cough, dyspnea Urologic: No hematuria, dysuria Abdominal:   No nausea, vomiting, diarrhea, bright red blood per rectum, melena, or hematemesis Neurologic:  No visual changes, wkns, changes in mental status. All other systems reviewed and are otherwise negative except as noted above.  Physical Exam    VS:  BP (!) 144/66   Pulse 61   Ht 5' 1.5" (1.562 m)   Wt 189 lb 3.2 oz (85.8 kg)   BMI 35.17 kg/m  , BMI Body mass index is 35.17 kg/m. GEN: Well nourished, well developed, in no acute distress. HEENT: normal. Neck: Supple, no JVD, carotid bruits, or masses. Cardiac: RRR, no murmurs, rubs, or gallops. No clubbing, cyanosis, edema.  Radials/DP/PT 2+ and equal bilaterally.  Respiratory:  Respirations regular and unlabored, clear to auscultation bilaterally. GI: Soft, nontender, nondistended, BS + x 4. MS: no deformity or atrophy. Skin: warm and dry, no rash. Neuro:  Strength and sensation are intact. Psych: Normal affect.  Accessory Clinical Findings    Recent Labs: 06/29/2019: ALT 15; Hemoglobin 13.9; Platelets 148 09/23/2019: BUN 24; Creatinine, Ser 0.92; Potassium 4.5; Sodium 138   Recent Lipid Panel No results found for: CHOL, TRIG, HDL, CHOLHDL, VLDL, LDLCALC, LDLDIRECT  ECG personally reviewed by me today-none today.  Coronary CTA 09/28/2019 Aorta: Normal size. Scattered calcifications in the proximal and descending thoracic aorta. No dissection.  Aortic Valve:  Trileaflet.   No calcifications.  Mitral Valve: Moderate mitral annular calcifications.  Coronary Arteries:  Normal coronary origin.  Right dominance.  RCA  is a large dominant artery that gives rise to PDA and PLVB. There is minimal calcified plaque in the proximal RCA with associated stenosis of 0-24%. The ongoing proximal and mid RCA are diffusely diseased with mild to moderate mixed plaque with associated stenosis of 50-69%. There is mild mixed plaque in the distal RCA with associated stenosis of 25-49%. The posterolateral branch is small and diffusely diseased.  Left main is a large artery that gives rise to LAD and LCX arteries. There is minimal calcified plaque in the proximal LM with associated stenosis of 0-24%.  LAD is a large vessel. The ostial and proximal LAD are diffusely diseased with mild calcified plaque with associated stenosis of 25-49%. The LAD gives rise to a small D1 followed by a complex lesion in the mix LAD with moderate and possibly severe mixed plaque between D1 and D2 with associated stenosis of at least 50-69% and possibly > 70%.  LCX is a non-dominant artery that gives rise to one large OM1 branch. There is diffuse mild calcified plaque in the proximal and mid LCx with associated stenosis of 25-49%.  Other findings:  Normal pulmonary vein drainage into the left atrium.  Normal let atrial appendage without a thrombus.  Normal size of the pulmonary artery.  IMPRESSION: 1. Coronary calcium score of 537. This was 93rd percentile for age and sex matched control.  2.  Normal coronary origin with right dominance.  3.  Moderate atherosclerosis.  CAD RADS 3.  4. Consider symptom-guided anti-ischemic and preventive pharmacotherapy as well as risk factor modification per guideline directed care.  5. Other treatments should be considered per guideline-directed care.  5.  This study has been submitted for FFR flow analysis.  FFR  09/29/2019 FINDINGS: FFRct analysis was performed on the original cardiac CT angiogram dataset. Diagrammatic representation of the FFRct analysis is provided in a separate PDF document in PACS. This dictation was created using the PDF document and an interactive 3D model of the results. 3D model is not available in the EMR/PACS. Normal FFR range is >0.80.  1. Left Main: No significant stenosis. LM FFR = 1.00.  2. LAD: Possible flow limiting lesion in mid LAD. Proximal FFR = 0.98, Mid FFR = 0.66, Distal FFR = 0.63.  3. LCX: No significant stenosis. Proximal FFR = 0.99, Mid FFR = 0.93, Distal FFR = 0.93.  4. RCA: No significant stenosis. Proximal FFR = 1.00, Mid FFR = 0.94, Distal FFR = 0.93.  IMPRESSION: 1. Coronary CT FFR flow analysis demonstrates a possible flow limiting lesion in the mid LAD. Proximal LAD FFR 0.98 with acute drop in flow in mid LAD with FFR 0.66. The vessel diameter just distal to lesion in mid LAD is 2.25mm-3.62mm.  2.  Recommend Cardiac Catheterization.  Traci Turner  Assessment & Plan   1.  Coronary artery disease-no chest pain today.  Coronary CTA showed LAD stenosis of 50-69% with FFR of 0.66. Proceed with cardiac catheterization scheduled.  Procedure explained Heart healthy low-sodium diet-salty 6 given Increase physical activity as tolerated Order CBC and CMP  The patient understands that risks include but are not limited to stroke (1 in 1000), death (1 in 1000), kidney failure [usually temporary] (1 in 500), bleeding (1 in 200), allergic reaction [possibly serious] (1 in 200), and agrees to proceed.   Essential hypertension-BP today 144/66.  Well-controlled at home 120s over 60s Continue Hyzaar, metoprolol Heart healthy low-sodium diet-salty 6 given   PVCs/PACs-previously titrated off beta-blocker and placed on diltiazem.  Plan to reevaluate post cardiac catheterization.   Disposition: Follow-up with myself or Dr. Herbie Baltimore after cardiac  catheterization.  Thomasene Ripple. Breeann Reposa NP-C    10/01/2019, 2:55 PM [facility] [address] Office [phone] Fax [phone]  Notice: This dictation was prepared with Dragon dictation along with smaller phrase technology. Any transcriptional errors that result from this process are unintentional and may not be corrected upon review.

## 2019-10-01 NOTE — H&P (View-Only) (Signed)
Cardiology Clinic Note   Patient Name: Kiara Miller Date of Encounter: 10/01/2019  Primary Care Provider:  Tracey Harries, MD Primary Cardiologist:  Bryan Lemma, MD  Patient Profile    Kiara Miller 69 year old female presents to the clinic today for precath cardiology visit.  Past Medical History    Past Medical History:  Diagnosis Date  . Diverticulitis April 2017  . Hyperlipidemia   . Hypertension   . Joint pain   . Osteoarthritis   . Polyarthralgia   . Rheumatoid arthritis (HCC)   . Right knee pain    Past Surgical History:  Procedure Laterality Date  . ABDOMINAL HYSTERECTOMY    . CARPAL TUNNEL RELEASE    . KNEE ARTHROPLASTY    . MANDIBLE SURGERY    . NM MYOVIEW LTD  06/2017   (Ordered for coronary calcium score 376) LOW RISK.  EF 72%.  No ischemia or infarction.  No wall motion normality.  No change from previous.  . OSTEOTOMY PELVIS BILATERAL    . TONSILLECTOMY    . TONSILLECTOMY    . TOTAL KNEE ARTHROPLASTY Right 09/02/2018   Procedure: TOTAL KNEE ARTHROPLASTY;  Surgeon: Durene Romans, MD;  Location: [facility];  Service: Orthopedics;  Laterality: Right;  70 mins  . TRANSTHORACIC ECHOCARDIOGRAM  12/2010   EF 65-70%. Mild LVH. No RWMA/ Gr1 DD.  . TUBAL LIGATION    . WRIST FUSION      Allergies  Allergies  Allergen Reactions  . Clindamycin/Lincomycin Other (See Comments)    Severe esophagitis  . Codeine Itching  . Leflunomide Nausea Only  . Penicillins Itching    Did it involve swelling of the face/tongue/throat, SOB, or low BP? No Did it involve sudden or severe rash/hives, skin peeling, or any reaction on the inside of your mouth or nose? No Did you need to seek medical attention at a hospital or doctor's office? No When did it last happen?2010 If all above answers are "NO", may proceed with cephalosporin use.   . Statins     Muscle weakness  . Tetracyclines & Related Nausea And Vomiting  . Levofloxacin Nausea Only and Palpitations     History of Present Illness    Kiara Miller has a past medical history of PVCs, coronary artery disease these, essential hypertension, PACs, palpitations, dyspnea, precordial chest pain, and chronic fatigue.  She presented to the cardiology clinic 09/04/2019 and was evaluated by Dr. Herbie Baltimore.  She was previously seen by Dr. Herbie Baltimore 7/21 with complaints of heart palpitations while trying to reduce her beta-blocker.  Heart rates were noted to be in the 50s with palpitations.  Her physical activity was limited by hip and knee pain.  She was weaned off metoprolol and reassessed due to an increase in her fatigue.  She she went back on metoprolol before completing her taper.  It was felt that she had not been off of enough of the medication to notice a difference in her fatigue.  She was also noted to have dizziness and nausea while on Plaquenil.  She also had some associated dyspnea.  She did not have any active angina at the time.  Her beta-blocker was converted to diltiazem 120 mg.  It was unsure if her lack of energy was related to her rheumatoid arthritis versus cardiovascular issues.  Her EKG showed normal sinus rhythm with incomplete right bundle branch block inferior MI undetermined age 5 bpm.  She underwent coronary CTA on 09/28/2019 which showed LAD stenosis of 50-69%.  FFR was completed and showed 0.66 flow and LAD.  She presents to the clinic today with her husband for preop/precath evaluation and states for several months she has been increasingly short of breath.  She states that she was only able to ambulate around her house.  She underwent total knee replacement 08/2018 and feels that since that time she has not been able to recover but this.  She is still somewhat limited by her left ankle pain.  She has continued a high-fiber low carbohydrate diet.  Her coronary CTA and FFR results were reviewed.  She was given opportunity to ask questions about her upcoming cardiac catheterization.  Her risks and  the procedure were reviewed.  She expressed understanding.  Her blood pressure is slightly increased in the office today however, she monitors her blood pressure at home and has been running 120s over 60s.  I will have her follow-up with cardiology after her cardiac catheterization.  Today she denies increased chest pain, increased shortness of breath, lower extremity edema, fatigue, palpitations, melena, hematuria, hemoptysis, diaphoresis, weakness, presyncope, syncope, orthopnea, and PND.   Home Medications    Prior to Admission medications   Medication Sig Start Date End Date Taking? Authorizing Provider  cephALEXin (KEFLEX) 500 MG capsule Take 2,000 mg by mouth See admin instructions. Take 2000 mg 1 hour prior to dental work 01/23/19   [provider]  ibuprofen (ADVIL) 200 MG tablet Take 800 mg by mouth 2 (two) times daily as needed for headache or moderate pain.    [provider]  ketoconazole (NIZORAL) 2 % cream Apply 1 application topically daily as needed for irritation.    [provider]  levothyroxine (SYNTHROID, LEVOTHROID) 75 MCG tablet Take 75 mcg by mouth daily before breakfast.    [provider]  losartan-hydrochlorothiazide (HYZAAR) 100-25 MG per tablet Take 1 tablet by mouth daily. 12/05/10   Nahser, Deloris Ping, MD  metoprolol succinate (TOPROL-XL) 25 MG 24 hr tablet Take 12.5 mg by mouth daily.    [provider]  montelukast (SINGULAIR) 10 MG tablet Take 10 mg by mouth at bedtime.      [provider]  nitroGLYCERIN (NITROSTAT) 0.4 MG SL tablet Place 1 tablet (0.4 mg total) under the tongue every 5 (five) minutes as needed for chest pain. Call EMS if CP is not relieved after 2 doses. You may take up to 3 doses. 09/30/19 12/29/19  Marykay Lex, MD  Omega-3 Fatty Acids (FISH OIL) 1200 MG CAPS Take 1,200 mg by mouth daily.     [provider]  Vitamin D, Ergocalciferol, (DRISDOL) 50000 UNITS CAPS capsule Take 50,000  Units by mouth every Wednesday.     [provider]    Family History    Family History  Problem Relation Age of Onset  . Hypertension Father   . Lung disease Brother    She indicated that her mother is deceased. She indicated that her father is deceased. She indicated that her sister is alive. She indicated that her brother is deceased. She indicated that her son is alive.  Social History    Social History   Socioeconomic History  . Marital status: Married    Spouse name: Not on file  . Number of children: Not on file  . Years of education: Not on file  . Highest education level: Not on file  Occupational History  . Not on file  Tobacco Use  . Smoking status: Former Smoker    Packs/day:  0.50    Years: 48.00    Pack years: 24.00    Types: Cigarettes    Quit date: 08/18/2018    Years since quitting: 1.1  . Smokeless tobacco: Never Used  Vaping Use  . Vaping Use: Never used  Substance and Sexual Activity  . Alcohol use: No  . Drug use: No  . Sexual activity: Not on file  Other Topics Concern  . Not on file  Social History Narrative  . Not on file   Social Determinants of Health   Financial Resource Strain:   . Difficulty of Paying Living Expenses: Not on file  Food Insecurity:   . Worried About Programme researcher, broadcasting/film/video in the Last Year: Not on file  . Ran Out of Food in the Last Year: Not on file  Transportation Needs:   . Lack of Transportation (Medical): Not on file  . Lack of Transportation (Non-Medical): Not on file  Physical Activity:   . Days of Exercise per Week: Not on file  . Minutes of Exercise per Session: Not on file  Stress:   . Feeling of Stress : Not on file  Social Connections:   . Frequency of Communication with Friends and Family: Not on file  . Frequency of Social Gatherings with Friends and Family: Not on file  . Attends Religious Services: Not on file  . Active Member of Clubs or Organizations: Not on file  . Attends Tax inspector Meetings: Not on file  . Marital Status: Not on file  Intimate Partner Violence:   . Fear of Current or Ex-Partner: Not on file  . Emotionally Abused: Not on file  . Physically Abused: Not on file  . Sexually Abused: Not on file     Review of Systems    General:  No chills, fever, night sweats or weight changes.  Cardiovascular:  No chest pain, dyspnea on exertion, edema, orthopnea, palpitations, paroxysmal nocturnal dyspnea. Dermatological: No rash, lesions/masses Respiratory: No cough, dyspnea Urologic: No hematuria, dysuria Abdominal:   No nausea, vomiting, diarrhea, bright red blood per rectum, melena, or hematemesis Neurologic:  No visual changes, wkns, changes in mental status. All other systems reviewed and are otherwise negative except as noted above.  Physical Exam    VS:  BP (!) 144/66   Pulse 61   Ht 5' 1.5" (1.562 m)   Wt 189 lb 3.2 oz (85.8 kg)   BMI 35.17 kg/m  , BMI Body mass index is 35.17 kg/m. GEN: Well nourished, well developed, in no acute distress. HEENT: normal. Neck: Supple, no JVD, carotid bruits, or masses. Cardiac: RRR, no murmurs, rubs, or gallops. No clubbing, cyanosis, edema.  Radials/DP/PT 2+ and equal bilaterally.  Respiratory:  Respirations regular and unlabored, clear to auscultation bilaterally. GI: Soft, nontender, nondistended, BS + x 4. MS: no deformity or atrophy. Skin: warm and dry, no rash. Neuro:  Strength and sensation are intact. Psych: Normal affect.  Accessory Clinical Findings    Recent Labs: 06/29/2019: ALT 15; Hemoglobin 13.9; Platelets 148 09/23/2019: BUN 24; Creatinine, Ser 0.92; Potassium 4.5; Sodium 138   Recent Lipid Panel No results found for: CHOL, TRIG, HDL, CHOLHDL, VLDL, LDLCALC, LDLDIRECT  ECG personally reviewed by me today-none today.  Coronary CTA 09/28/2019 Aorta: Normal size. Scattered calcifications in the proximal and descending thoracic aorta. No dissection.  Aortic Valve:  Trileaflet.   No calcifications.  Mitral Valve: Moderate mitral annular calcifications.  Coronary Arteries:  Normal coronary origin.  Right dominance.  RCA  is a large dominant artery that gives rise to PDA and PLVB. There is minimal calcified plaque in the proximal RCA with associated stenosis of 0-24%. The ongoing proximal and mid RCA are diffusely diseased with mild to moderate mixed plaque with associated stenosis of 50-69%. There is mild mixed plaque in the distal RCA with associated stenosis of 25-49%. The posterolateral branch is small and diffusely diseased.  Left main is a large artery that gives rise to LAD and LCX arteries. There is minimal calcified plaque in the proximal LM with associated stenosis of 0-24%.  LAD is a large vessel. The ostial and proximal LAD are diffusely diseased with mild calcified plaque with associated stenosis of 25-49%. The LAD gives rise to a small D1 followed by a complex lesion in the mix LAD with moderate and possibly severe mixed plaque between D1 and D2 with associated stenosis of at least 50-69% and possibly > 70%.  LCX is a non-dominant artery that gives rise to one large OM1 branch. There is diffuse mild calcified plaque in the proximal and mid LCx with associated stenosis of 25-49%.  Other findings:  Normal pulmonary vein drainage into the left atrium.  Normal let atrial appendage without a thrombus.  Normal size of the pulmonary artery.  IMPRESSION: 1. Coronary calcium score of 537. This was 93rd percentile for age and sex matched control.  2.  Normal coronary origin with right dominance.  3.  Moderate atherosclerosis.  CAD RADS 3.  4. Consider symptom-guided anti-ischemic and preventive pharmacotherapy as well as risk factor modification per guideline directed care.  5. Other treatments should be considered per guideline-directed care.  5.  This study has been submitted for FFR flow analysis.  FFR  09/29/2019 FINDINGS: FFRct analysis was performed on the original cardiac CT angiogram dataset. Diagrammatic representation of the FFRct analysis is provided in a separate PDF document in PACS. This dictation was created using the PDF document and an interactive 3D model of the results. 3D model is not available in the EMR/PACS. Normal FFR range is >0.80.  1. Left Main: No significant stenosis. LM FFR = 1.00.  2. LAD: Possible flow limiting lesion in mid LAD. Proximal FFR = 0.98, Mid FFR = 0.66, Distal FFR = 0.63.  3. LCX: No significant stenosis. Proximal FFR = 0.99, Mid FFR = 0.93, Distal FFR = 0.93.  4. RCA: No significant stenosis. Proximal FFR = 1.00, Mid FFR = 0.94, Distal FFR = 0.93.  IMPRESSION: 1. Coronary CT FFR flow analysis demonstrates a possible flow limiting lesion in the mid LAD. Proximal LAD FFR 0.98 with acute drop in flow in mid LAD with FFR 0.66. The vessel diameter just distal to lesion in mid LAD is 2.25mm-3.62mm.  2.  Recommend Cardiac Catheterization.  Traci Turner  Assessment & Plan   1.  Coronary artery disease-no chest pain today.  Coronary CTA showed LAD stenosis of 50-69% with FFR of 0.66. Proceed with cardiac catheterization scheduled.  Procedure explained Heart healthy low-sodium diet-salty 6 given Increase physical activity as tolerated Order CBC and CMP  The patient understands that risks include but are not limited to stroke (1 in 1000), death (1 in 1000), kidney failure [usually temporary] (1 in 500), bleeding (1 in 200), allergic reaction [possibly serious] (1 in 200), and agrees to proceed.   Essential hypertension-BP today 144/66.  Well-controlled at home 120s over 60s Continue Hyzaar, metoprolol Heart healthy low-sodium diet-salty 6 given   PVCs/PACs-previously titrated off beta-blocker and placed on diltiazem.  Plan to reevaluate post cardiac catheterization.   Disposition: Follow-up with myself or Dr. Herbie Baltimore after cardiac  catheterization.  Thomasene Ripple. Leanard Dimaio NP-C    10/01/2019, 2:55 PM [facility] [address] Office [phone] Fax [phone]  Notice: This dictation was prepared with Dragon dictation along with smaller phrase technology. Any transcriptional errors that result from this process are unintentional and may not be corrected upon review.

## 2019-10-02 ENCOUNTER — Telehealth: Payer: Self-pay | Admitting: *Deleted

## 2019-10-02 LAB — COMPREHENSIVE METABOLIC PANEL
ALT: 18 IU/L (ref 0–32)
AST: 15 IU/L (ref 0–40)
Albumin/Globulin Ratio: 1.8 (ref 1.2–2.2)
Albumin: 4.4 g/dL (ref 3.8–4.8)
Alkaline Phosphatase: 49 IU/L (ref 48–121)
BUN/Creatinine Ratio: 27 (ref 12–28)
BUN: 25 mg/dL (ref 8–27)
Bilirubin Total: 0.7 mg/dL (ref 0.0–1.2)
CO2: 23 mmol/L (ref 20–29)
Calcium: 9.7 mg/dL (ref 8.7–10.3)
Chloride: 100 mmol/L (ref 96–106)
Creatinine, Ser: 0.94 mg/dL (ref 0.57–1.00)
GFR calc Af Amer: 71 mL/min/{1.73_m2} (ref >59)
GFR calc non Af Amer: 62 mL/min/{1.73_m2} (ref >59)
Globulin, Total: 2.4 g/dL (ref 1.5–4.5)
Glucose: 86 mg/dL (ref 65–99)
Potassium: 3.9 mmol/L (ref 3.5–5.2)
Sodium: 138 mmol/L (ref 134–144)
Total Protein: 6.8 g/dL (ref 6.0–8.5)

## 2019-10-02 LAB — CBC
Hematocrit: 42.8 % (ref 34.0–46.6)
Hemoglobin: 15 g/dL (ref 11.1–15.9)
MCH: 31.5 pg (ref 26.6–33.0)
MCHC: 35 g/dL (ref 31.5–35.7)
MCV: 90 fL (ref 79–97)
Platelets: 163 10*3/uL (ref 150–450)
RBC: 4.76 x10E6/uL (ref 3.77–5.28)
RDW: 13.8 % (ref 11.7–15.4)
WBC: 6 10*3/uL (ref 3.4–10.8)

## 2019-10-02 LAB — SARS CORONAVIRUS 2 (TAT 6-24 HRS): SARS Coronavirus 2: NEGATIVE

## 2019-10-02 NOTE — Telephone Encounter (Signed)
Pt contacted pre-catheterization scheduled at [facility] for: Monday October 05, 2019 12 Noon Verified arrival time and place: [facility] Main Entrance A [facility]) at: 10 AM   No solid food after midnight prior to cath, clear liquids until 5 AM day of procedure.  Hold: Losartan-HCTZ-AM of procedure  Except hold medications AM meds can be  taken pre-cath with sips of water including: ASA 81 mg   Confirmed patient has responsible adult to drive home post procedure and observe 24 hours after arriving home: yes  You are allowed ONE visitor in the waiting room during the time you are at the hospital for your procedure. Both you and your visitor must wear a mask once you enter the hospital.       COVID-19 Pre-Screening Questions:   In the past 10 days have you had a new cough, shortness of breath, headache, congestion, fever (100 or greater) unexplained body aches, new sore throat, or sudden loss of taste or sense of smell? no  In the past 10 days have you been around anyone with known Covid 19? no  Have you been vaccinated for COVID-19? Yes, see immunization history  Reviewed procedure/mask/visitor instructions, COVID-19 questions with patient

## 2019-10-05 ENCOUNTER — Other Ambulatory Visit: Payer: Self-pay

## 2019-10-05 ENCOUNTER — Ambulatory Visit (HOSPITAL_COMMUNITY)
Admission: RE | Admit: 2019-10-05 | Discharge: 2019-10-05 | Disposition: A | Discharge status: Home / Self Care | Payer: Medicare Other | Attending: Cardiology | Admitting: Cardiology

## 2019-10-05 ENCOUNTER — Encounter (HOSPITAL_COMMUNITY)
Admission: RE | Disposition: A | Discharge status: Home / Self Care | Payer: Self-pay | Source: Home / Self Care | Attending: Cardiology

## 2019-10-05 DIAGNOSIS — I10 Essential (primary) hypertension: Secondary | ICD-10-CM | POA: Diagnosis present

## 2019-10-05 DIAGNOSIS — Z885 Allergy status to narcotic agent status: Secondary | ICD-10-CM | POA: Insufficient documentation

## 2019-10-05 DIAGNOSIS — R931 Abnormal findings on diagnostic imaging of heart and coronary circulation: Secondary | ICD-10-CM | POA: Diagnosis present

## 2019-10-05 DIAGNOSIS — Z79890 Hormone replacement therapy (postmenopausal): Secondary | ICD-10-CM | POA: Insufficient documentation

## 2019-10-05 DIAGNOSIS — E785 Hyperlipidemia, unspecified: Secondary | ICD-10-CM | POA: Diagnosis not present

## 2019-10-05 DIAGNOSIS — I25110 Atherosclerotic heart disease of native coronary artery with unstable angina pectoris: Secondary | ICD-10-CM | POA: Insufficient documentation

## 2019-10-05 DIAGNOSIS — Z9861 Coronary angioplasty status: Secondary | ICD-10-CM

## 2019-10-05 DIAGNOSIS — Z87891 Personal history of nicotine dependence: Secondary | ICD-10-CM | POA: Diagnosis not present

## 2019-10-05 DIAGNOSIS — Z79899 Other long term (current) drug therapy: Secondary | ICD-10-CM | POA: Diagnosis not present

## 2019-10-05 DIAGNOSIS — Z880 Allergy status to penicillin: Secondary | ICD-10-CM | POA: Insufficient documentation

## 2019-10-05 DIAGNOSIS — M069 Rheumatoid arthritis, unspecified: Secondary | ICD-10-CM | POA: Insufficient documentation

## 2019-10-05 DIAGNOSIS — Z881 Allergy status to other antibiotic agents status: Secondary | ICD-10-CM | POA: Diagnosis not present

## 2019-10-05 DIAGNOSIS — I2510 Atherosclerotic heart disease of native coronary artery without angina pectoris: Secondary | ICD-10-CM | POA: Insufficient documentation

## 2019-10-05 DIAGNOSIS — Z888 Allergy status to other drugs, medicaments and biological substances status: Secondary | ICD-10-CM | POA: Diagnosis not present

## 2019-10-05 DIAGNOSIS — I200 Unstable angina: Secondary | ICD-10-CM | POA: Diagnosis present

## 2019-10-05 HISTORY — PX: CORONARY STENT INTERVENTION: CATH118234

## 2019-10-05 HISTORY — PX: LEFT HEART CATH AND CORONARY ANGIOGRAPHY: CATH118249

## 2019-10-05 HISTORY — DX: Coronary angioplasty status: Z98.61

## 2019-10-05 HISTORY — DX: Atherosclerotic heart disease of native coronary artery without angina pectoris: I25.10

## 2019-10-05 LAB — POCT ACTIVATED CLOTTING TIME
Activated Clotting Time: 351 seconds
Activated Clotting Time: 307 seconds

## 2019-10-05 SURGERY — LEFT HEART CATH AND CORONARY ANGIOGRAPHY
Anesthesia: LOCAL

## 2019-10-05 MED ORDER — ASPIRIN 81 MG PO CHEW: 81.0000 mg | CHEWABLE_TABLET | ORAL | Status: DC

## 2019-10-05 MED ORDER — VERAPAMIL HCL 2.5 MG/ML IV SOLN
INTRAVENOUS | Status: DC | PRN
  Administered 2019-10-05: 10 mL via INTRA_ARTERIAL

## 2019-10-05 MED ORDER — SODIUM CHLORIDE 0.9% FLUSH: 3.0000 mL | INTRAVENOUS | Status: DC | PRN

## 2019-10-05 MED ORDER — NITROGLYCERIN 1 MG/10 ML FOR IR/CATH LAB
INTRA_ARTERIAL | Status: AC
  Filled 2019-10-05: qty 10

## 2019-10-05 MED ORDER — CLOPIDOGREL BISULFATE 300 MG PO TABS
ORAL_TABLET | ORAL | Status: AC
  Filled 2019-10-05: qty 2

## 2019-10-05 MED ORDER — LIDOCAINE HCL (PF) 1 % IJ SOLN
INTRAMUSCULAR | Status: AC
  Filled 2019-10-05: qty 30

## 2019-10-05 MED ORDER — MIDAZOLAM HCL 2 MG/2ML IJ SOLN
INTRAMUSCULAR | Status: DC | PRN
  Administered 2019-10-05: 2 mg via INTRAVENOUS
  Administered 2019-10-05: 1 mg via INTRAVENOUS

## 2019-10-05 MED ORDER — ASPIRIN 81 MG PO CHEW: 81.0000 mg | CHEWABLE_TABLET | Freq: Every day | ORAL | Status: DC

## 2019-10-05 MED ORDER — HEPARIN SODIUM (PORCINE) 1000 UNIT/ML IJ SOLN
INTRAMUSCULAR | Status: DC | PRN
  Administered 2019-10-05 (×2): 5000 [IU] via INTRAVENOUS

## 2019-10-05 MED ORDER — IOHEXOL 350 MG/ML SOLN
INTRAVENOUS | Status: DC | PRN
  Administered 2019-10-05: 160 mL

## 2019-10-05 MED ORDER — SODIUM CHLORIDE 0.9 % IV SOLN: 250.0000 mL | INTRAVENOUS | Status: DC | PRN

## 2019-10-05 MED ORDER — HYDRALAZINE HCL 20 MG/ML IJ SOLN: 10.0000 mg | INTRAMUSCULAR | Status: DC | PRN

## 2019-10-05 MED ORDER — HEPARIN SODIUM (PORCINE) 1000 UNIT/ML IJ SOLN
INTRAMUSCULAR | Status: AC
  Filled 2019-10-05: qty 1

## 2019-10-05 MED ORDER — FAMOTIDINE IN NACL 20-0.9 MG/50ML-% IV SOLN
INTRAVENOUS | Status: AC
  Filled 2019-10-05: qty 50

## 2019-10-05 MED ORDER — LIDOCAINE HCL (PF) 1 % IJ SOLN
INTRAMUSCULAR | Status: DC | PRN
  Administered 2019-10-05: 2 mL

## 2019-10-05 MED ORDER — HEPARIN (PORCINE) IN NACL 1000-0.9 UT/500ML-% IV SOLN
INTRAVENOUS | Status: AC
  Filled 2019-10-05: qty 1000

## 2019-10-05 MED ORDER — SODIUM CHLORIDE 0.9% FLUSH: 3.0000 mL | Freq: Two times a day (BID) | INTRAVENOUS | Status: DC

## 2019-10-05 MED ORDER — MIDAZOLAM HCL 2 MG/2ML IJ SOLN
INTRAMUSCULAR | Status: AC
  Filled 2019-10-05: qty 2

## 2019-10-05 MED ORDER — VERAPAMIL HCL 2.5 MG/ML IV SOLN
INTRAVENOUS | Status: AC
  Filled 2019-10-05: qty 2

## 2019-10-05 MED ORDER — FENTANYL CITRATE (PF) 100 MCG/2ML IJ SOLN
INTRAMUSCULAR | Status: DC | PRN
Start: 2019-10-05 — End: 2019-10-05
  Administered 2019-10-05 (×2): 25 ug via INTRAVENOUS

## 2019-10-05 MED ORDER — LABETALOL HCL 5 MG/ML IV SOLN: 10.0000 mg | INTRAVENOUS | Status: DC | PRN

## 2019-10-05 MED ORDER — SODIUM CHLORIDE 0.9 % IV SOLN: INTRAVENOUS | Status: DC

## 2019-10-05 MED ORDER — NITROGLYCERIN 1 MG/10 ML FOR IR/CATH LAB
INTRA_ARTERIAL | Status: DC | PRN
  Administered 2019-10-05 (×4): 200 ug via INTRA_ARTERIAL

## 2019-10-05 MED ORDER — SODIUM CHLORIDE 0.9 % WEIGHT BASED INFUSION
3.0000 mL/kg/h | INTRAVENOUS | Status: DC
  Administered 2019-10-05: 3 mL/kg/h via INTRAVENOUS

## 2019-10-05 MED ORDER — HEPARIN (PORCINE) IN NACL 1000-0.9 UT/500ML-% IV SOLN
INTRAVENOUS | Status: DC | PRN
  Administered 2019-10-05 (×2): 500 mL

## 2019-10-05 MED ORDER — ACETAMINOPHEN 325 MG PO TABS: 650.0000 mg | ORAL_TABLET | ORAL | Status: DC | PRN

## 2019-10-05 MED ORDER — CLOPIDOGREL BISULFATE 75 MG PO TABS: 75.0000 mg | ORAL_TABLET | Freq: Every day | ORAL | Status: DC

## 2019-10-05 MED ORDER — SODIUM CHLORIDE 0.9 % WEIGHT BASED INFUSION
1.0000 mL/kg/h | INTRAVENOUS | Status: DC
  Administered 2019-10-05: 1 mL/kg/h via INTRAVENOUS

## 2019-10-05 MED ORDER — CLOPIDOGREL BISULFATE 75 MG PO TABS: 75.0000 mg | ORAL_TABLET | Freq: Every day | ORAL | 0 refills | Status: DC

## 2019-10-05 MED ORDER — FENTANYL CITRATE (PF) 100 MCG/2ML IJ SOLN
INTRAMUSCULAR | Status: AC
  Filled 2019-10-05: qty 2

## 2019-10-05 MED ORDER — CLOPIDOGREL BISULFATE 75 MG PO TABS: 75.0000 mg | ORAL_TABLET | Freq: Every day | ORAL | 11 refills | Status: DC

## 2019-10-05 MED ORDER — FAMOTIDINE IN NACL 20-0.9 MG/50ML-% IV SOLN
INTRAVENOUS | Status: DC | PRN
  Administered 2019-10-05: 20 mg via INTRAVENOUS

## 2019-10-05 MED ORDER — ASPIRIN EC 81 MG PO TBEC: 81.0000 mg | DELAYED_RELEASE_TABLET | Freq: Every day | ORAL | 11 refills | Status: DC

## 2019-10-05 MED ORDER — ONDANSETRON HCL 4 MG/2ML IJ SOLN: 4.0000 mg | Freq: Four times a day (QID) | INTRAMUSCULAR | Status: DC | PRN

## 2019-10-05 MED ORDER — CLOPIDOGREL BISULFATE 300 MG PO TABS
ORAL_TABLET | ORAL | Status: DC | PRN
  Administered 2019-10-05: 600 mg via ORAL

## 2019-10-05 MED FILL — CLOPIDOGREL 75 MG TABLET: 75 | 30 days supply | Qty: 30 | Fill #0

## 2019-10-05 SURGICAL SUPPLY — 20 items
BALLN SAPPHIRE 2.0X15 (BALLOONS) ×2
BALLN SAPPHIRE ~~LOC~~ 2.75X12 (BALLOONS) ×2 IMPLANT
BALLOON SAPPHIRE 2.0X15 (BALLOONS) ×1 IMPLANT
CATH LAUNCHER 6FR JR4 (CATHETERS) ×2 IMPLANT
CATH OPTITORQUE TIG 4.0 5F (CATHETERS) ×2 IMPLANT
CATH VISTA GUIDE 6FR XB3.5 (CATHETERS) ×2 IMPLANT
DEVICE RAD COMP TR BAND LRG (VASCULAR PRODUCTS) ×2 IMPLANT
GLIDESHEATH SLEND SS 6F .021 (SHEATH) ×2 IMPLANT
GUIDEWIRE INQWIRE 1.5J.035X260 (WIRE) ×1 IMPLANT
GUIDEWIRE PRESSURE COMET II (WIRE) ×2 IMPLANT
INQWIRE 1.5J .035X260CM (WIRE) ×2
KIT ENCORE 26 ADVANTAGE (KITS) ×2 IMPLANT
KIT ESSENTIALS PG (KITS) ×2 IMPLANT
KIT HEART LEFT (KITS) ×2 IMPLANT
PACK CARDIAC CATHETERIZATION (CUSTOM PROCEDURE TRAY) ×2 IMPLANT
SHEATH PROBE COVER 6X72 (BAG) ×2 IMPLANT
STENT RESOLUTE ONYX 2.5X15 (Permanent Stent) ×2 IMPLANT
TRANSDUCER W/STOPCOCK (MISCELLANEOUS) ×2 IMPLANT
TUBING CIL FLEX 10 FLL-RA (TUBING) ×2 IMPLANT
WIRE ASAHI PROWATER 180CM (WIRE) ×2 IMPLANT

## 2019-10-05 NOTE — Discharge Instructions (Signed)
Drink plenty of fluids for 48 hours and keep wrist elevated at heart level for 24 hours  Radial Site Care   This sheet gives you information about how to care for yourself after your procedure. Your health care provider may also give you more specific instructions. If you have problems or questions, contact your health care provider. What can I expect after the procedure? After the procedure, it is common to have:  Bruising and tenderness at the catheter insertion area. Follow these instructions at home: Medicines  Take over-the-counter and prescription medicines only as told by your health care provider. Insertion site care 1. Follow instructions from your health care provider about how to take care of your insertion site. Make sure you: ? Wash your hands with soap and water before you change your bandage (dressing). If soap and water are not available, use hand sanitizer. ? Remove your dressing as told by your health care provider. In 24 hours 2. Check your insertion site every day for signs of infection. Check for: ? Redness, swelling, or pain. ? Fluid or blood. ? Pus or a bad smell. ? Warmth. 3. Do not take baths, swim, or use a hot tub until your health care provider approves. 4. You may shower 24-48 hours after the procedure, or as directed by your health care provider. ? Remove the dressing and gently wash the site with plain soap and water. ? Pat the area dry with a clean towel. ? Do not rub the site. That could cause bleeding. 5. Do not apply powder or lotion to the site. Activity   1. For 24 hours after the procedure, or as directed by your health care provider: ? Do not flex or bend the affected arm. ? Do not push or pull heavy objects with the affected arm. ? Do not drive yourself home from the hospital or clinic. You may drive 24 hours after the procedure unless your health care provider tells you not to. ? Do not operate machinery or power tools. 2. Do not lift  anything that is heavier than 5 lb, or the limit that you are told, until your health care provider says that it is safe.  For 7 days 3. Ask your health care provider when it is okay to: ? Return to work or school. ? Resume usual physical activities or sports. ? Resume sexual activity. General instructions  If the catheter site starts to bleed, raise your arm and put firm pressure on the site. If the bleeding does not stop, get help right away. This is a medical emergency.  If you went home on the same day as your procedure, a responsible adult should be with you for the first 24 hours after you arrive home.  Keep all follow-up visits as told by your health care provider. This is important. Contact a health care provider if:  You have a fever.  You have redness, swelling, or yellow drainage around your insertion site. Get help right away if:  You have unusual pain at the radial site.  The catheter insertion area swells very fast.  The insertion area is bleeding, and the bleeding does not stop when you hold steady pressure on the area.  Your arm or hand becomes pale, cool, tingly, or numb. These symptoms may represent a serious problem that is an emergency. Do not wait to see if the symptoms will go away. Get medical help right away. Call your local emergency services (911 in the [place] Do not drive   yourself to the hospital. Summary  After the procedure, it is common to have bruising and tenderness at the site.  Follow instructions from your health care provider about how to take care of your radial site wound. Check the wound every day for signs of infection.  Do not lift anything that is heavier than 10 lb (4.5 kg), or the limit that you are told, until your health care provider says that it is safe. This information is not intended to replace advice given to you by your health care provider. Make sure you discuss any questions you have with your health care provider. Document  Revised: 02/27/2017 Document Reviewed: 02/27/2017 Elsevier Patient Education  2020 [employer]  

## 2019-10-05 NOTE — Interval H&P Note (Signed)
History and Physical Interval Note:  10/05/2019 12:48 PM  Kiara Miller  has presented today for surgery, with the diagnosis of positive cardiac CT FFR of LAD.  The various methods of treatment have been discussed with the patient and family. After consideration of risks, benefits and other options for treatment, the patient has consented to  Procedure(s): LEFT HEART CATH AND CORONARY ANGIOGRAPHY (N/A) as a surgical intervention.  The patient's history has been reviewed, patient examined, no change in status, stable for surgery.  I have reviewed the patient's chart and labs.  Questions were answered to the patient's satisfaction.    Cath Lab Visit (complete for each Cath Lab visit)  Clinical Evaluation Leading to the Procedure:   ACS: No.  Non-ACS:    Anginal Classification: CCS III  Anti-ischemic medical therapy: Minimal Therapy (1 class of medications)  Non-Invasive Test Results: High-risk stress test findings: cardiac mortality >3%/year  Prior CABG: No previous CABG   Bryan Lemma

## 2019-10-05 NOTE — Discharge Summary (Signed)
Discharge Summary for Same Day PCI   Patient ID: Kiara Miller MRN: [id number]; DOB: 05-31-1950  Admit date: 10/05/2019 Discharge date: 10/05/2019  Primary Care Provider: Tracey Harries, MD  Primary Cardiologist: Bryan Lemma, MD   Discharge Diagnoses    Principal Problem:   Progressive angina [facility]) Active Problems:   Agatston coronary artery calcium score between 200 and 399   Essential hypertension   Abnormal cardiac CT angiography    Diagnostic Studies/Procedures    Cardiac Catheterization 10/05/2019:  CORONARY STENT INTERVENTION  LEFT HEART CATH AND CORONARY ANGIOGRAPHY   The left ventricular systolic function is normal. The left ventricular ejection fraction is greater than 65% by visual estimate.  LV end diastolic pressure is low.  ---------------------------------------  CULPRIT LESION: Mid LAD lesion is 85% stenosed.  A drug-eluting stent was successfully placed using a STENT RESOLUTE ONYX 2.5X15 -> postdilated 2.8 mm  Post intervention, there is a 0% residual stenosis.  ------------  Mid Cx lesion is 60% stenosed. Negative DFR  Prox RCA to Mid RCA lesion is 65% stenosed. Mid RCA lesion is 45% stenosed. Negative DFR (including the 45% mid RCA lesion.  2nd RPL lesion is 65% stenosed. (Small caliber vessel)   SUMMARY  Severe single-vessel disease with segmental 80% irregular lesion in the mid LAD between SP1 and D1 (as suggested by CT scan) ? Successful DES PCI of mid LAD reducing 80% to 0% using Resolute Onyx DES 2.5 mm x 15 mm postdilated to 2.8 mm.  Extensive moderate/nonobstructive by both CT FFR and DFR lesions in the proximal RCA (irregular 60%) and mid RCA (focal 45%) as well as proximal-mid LCx 60% eccentric lesion. ->  DFR both vessels was roughly 0.97  Normal LVEF and normal EDP.  No R WMA.   RECOMMENDATIONS:  Plan medical management of RCA and LCx disease.  Will need aggressive lipid management --> outpatient referral to [facility]  Pharmacist run [facility]id [facility]  Same-day discharge.  Has follow-up set up with Oris Drone, NP next week.  Diagnostic Dominance: Right  Intervention      History of Present Illness     Kiara Miller is a 69 y.o. female with history of PVCs, coronary artery disease, essential hypertension, PACs, palpitations, dyspnea, precordial chest pain, and chronic fatigue presented for outpatient cath.   Recently seen by Dr. Herbie Baltimore for palpitation and fatigue. Metoprolol changed to diltiazem. She underwent coronary CTA on 09/28/2019 which showed LAD stenosis of 50-69%.  FFR was completed and showed 0.66 flow and LAD.  Cardiac catheterization was arranged for further evaluation.  Hospital Course     The patient underwent cardiac cath as noted above. Severe single-vessel disease with segmental 80% irregular lesion in the mid LAD between SP1 and D1 (as suggested by CT scan). S/p Successful DES PCI of mid LAD reducing 80% to 0% using Resolute Onyx DES 2.5 mm x 15 mm postdilated to 2.8 mm. - Extensive moderate/nonobstructive by both CT FFR and DFR lesions in the proximal RCA (irregular 60%) and mid RCA (focal 45%) as well as proximal-mid LCx 60% eccentric lesion. ->  DFR both vessels was roughly 0.97. - Medical therapy for RCA and LCx disease Plan for DAPT with ASA and Plavix. The patient was seen by cardiac rehab while in short stay. There were no observed complications post cath. Radial cath site was re-evaluated prior to discharge and found to be stable without any complications. Instructions/precautions regarding cath site care were given prior to discharge.  Kiara Miller was  seen by Dr. Herbie Baltimore  and determined stable for discharge home. Follow up with our office has been arranged. Medications are listed below. Pertinent changes include addition of Plavix. She was seen post surgery, radial cath site is stable without significant bleeding. Although she did have some SOB after the procedure, this  improved prior to discharge.   _____________  Cath/PCI Registry Performance & Quality Measures: 1. Aspirin prescribed? - Yes 2. ADP Receptor Inhibitor (Plavix/Clopidogrel, Brilinta/Ticagrelor or Effient/Prasugrel) prescribed (includes medically managed patients)? - Yes 3. High Intensity Statin (Lipitor 40-80mg  or Crestor 20-40mg ) prescribed? - No - statin intolerance - lipid clinc evalaution as outpatient.  4. For EF <40%, was ACEI/ARB prescribed? - Yes 5. For EF <40%, Aldosterone Antagonist (Spironolactone or Eplerenone) prescribed? - Not Applicable (EF >/= 40%) 6. Cardiac Rehab Phase II ordered (Included Medically managed Patients)? - Yes  _____________   Discharge Vitals Blood pressure 128/62, pulse 60, temperature (!) 97.5 F (36.4 C), temperature source Oral, resp. rate (!) 31, height 5' 1.5" (1.562 m), weight 84.4 kg, SpO2 99 %.  Filed Weights   10/05/19 1005  Weight: 84.4 kg    Last Labs & Radiologic Studies    _____________  CARDIAC CATHETERIZATION  Result Date: 10/05/2019  The left ventricular systolic function is normal. The left ventricular ejection fraction is greater than 65% by visual estimate.  LV end diastolic pressure is low.  ---------------------------------------  CULPRIT LESION: Mid LAD lesion is 85% stenosed.  A drug-eluting stent was successfully placed using a STENT RESOLUTE ONYX 2.5X15 -> postdilated 2.8 mm  Post intervention, there is a 0% residual stenosis.  ------------  Mid Cx lesion is 60% stenosed. Negative DFR  Prox RCA to Mid RCA lesion is 65% stenosed. Mid RCA lesion is 45% stenosed. Negative DFR (including the 45% mid RCA lesion.  2nd RPL lesion is 65% stenosed. (Small caliber vessel)  SUMMARY  Severe single-vessel disease with segmental 80% irregular lesion in the mid LAD between SP1 and D1 (as suggested by CT scan)  Successful DES PCI of mid LAD reducing 80% to 0% using Resolute Onyx DES 2.5 mm x 15 mm postdilated to 2.8 mm.  Extensive  moderate/nonobstructive by both CT FFR and DFR lesions in the proximal RCA (irregular 60%) and mid RCA (focal 45%) as well as proximal-mid LCx 60% eccentric lesion. ->  DFR both vessels was roughly 0.97  Normal LVEF and normal EDP.  No R WMA. RECOMMENDATIONS:  Plan medical management of RCA and LCx disease.  Will need aggressive lipid management --> outpatient referral to [facility] Pharmacist run Lipid Clinic  Same-day discharge.  Has follow-up set up with Oris Drone, NP next week. Bryan Lemma, MD  CT CORONARY [facility] W/CTA COR W/SCORE [facility] W/CM &/OR WO/CM  Addendum Date: 09/29/2019   ADDENDUM REPORT: 09/29/2019 13:18 EXAM: Cardiac/Coronary  CT TECHNIQUE: The patient was scanned on a [employer]. FINDINGS: A 120 kV prospective scan was triggered in the descending thoracic aorta at 111 HU's. Axial non-contrast 3 mm slices were carried out through the heart. The data set was analyzed on a dedicated work station and scored using the Agatson method. Gantry rotation speed was 250 msecs and collimation was .6 mm. No beta blockade and 0.8 mg of sl NTG was given. The 3D data set was reconstructed in 5% intervals of the 67-82 % of the R-R cycle. Diastolic phases were analyzed on a dedicated work station using MPR, MIP and VRT modes. The patient received 80 cc of contrast. Aorta: Normal  size. Scattered calcifications in the proximal and descending thoracic aorta. No dissection. Aortic Valve:  Trileaflet.  No calcifications. Mitral Valve: Moderate mitral annular calcifications. Coronary Arteries:  Normal coronary origin.  Right dominance. RCA is a large dominant artery that gives rise to PDA and PLVB. There is minimal calcified plaque in the proximal RCA with associated stenosis of 0-24%. The ongoing proximal and mid RCA are diffusely diseased with mild to moderate mixed plaque with associated stenosis of 50-69%. There is mild mixed plaque in the distal RCA with associated stenosis of 25-49%. The  posterolateral branch is small and diffusely diseased. Left main is a large artery that gives rise to LAD and LCX arteries. There is minimal calcified plaque in the proximal LM with associated stenosis of 0-24%. LAD is a large vessel. The ostial and proximal LAD are diffusely diseased with mild calcified plaque with associated stenosis of 25-49%. The LAD gives rise to a small D1 followed by a complex lesion in the mix LAD with moderate and possibly severe mixed plaque between D1 and D2 with associated stenosis of at least 50-69% and possibly > 70%. LCX is a non-dominant artery that gives rise to one large OM1 branch. There is diffuse mild calcified plaque in the proximal and mid LCx with associated stenosis of 25-49%. Other findings: Normal pulmonary vein drainage into the left atrium. Normal let atrial appendage without a thrombus. Normal size of the pulmonary artery. IMPRESSION: 1. Coronary calcium score of 537. This was 93rd percentile for age and sex matched control. 2.  Normal coronary origin with right dominance. 3.  Moderate atherosclerosis.  CAD RADS 3. 4. Consider symptom-guided anti-ischemic and preventive pharmacotherapy as well as risk factor modification per guideline directed care. 5. Other treatments should be considered per guideline-directed care. 5.  This study has been submitted for FFR flow analysis. Armanda Magic Electronically Signed   By: Armanda Magic   On: 09/29/2019 13:18   Result Date: 09/29/2019 EXAM: OVER-READ INTERPRETATION  CT CHEST The following report is an over-read performed by radiologist Dr. Trudie Reed of [facility] on 09/28/2019. This over-read does not include interpretation of cardiac or coronary anatomy or pathology. The coronary calcium score/coronary CTA interpretation by the cardiologist is attached. COMPARISON:  Cardiac CT 05/23/2017. FINDINGS: Aortic atherosclerosis. Within the visualized portions of the thorax there are no suspicious appearing  pulmonary nodules or masses, there is no acute consolidative airspace disease, no pleural effusions, no pneumothorax and no lymphadenopathy. Visualized portions of the upper abdomen are unremarkable. There are no aggressive appearing lytic or blastic lesions noted in the visualized portions of the skeleton. IMPRESSION: 1.  Aortic Atherosclerosis (ICD10-I70.0). Electronically Signed: By: Trudie Reed M.D. On: 09/28/2019 13:38   CT CORONARY FRACTIONAL FLOW RESERVE DATA PREP  Result Date: 09/29/2019 EXAM: FFRCT ANALYSIS FINDINGS: FFRct analysis was performed on the original cardiac CT angiogram dataset. Diagrammatic representation of the FFRct analysis is provided in a separate PDF document in PACS. This dictation was created using the PDF document and an interactive 3D model of the results. 3D model is not available in the EMR/PACS. Normal FFR range is >0.80. 1. Left Main: No significant stenosis. LM FFR = 1.00. 2. LAD: Possible flow limiting lesion in mid LAD. Proximal FFR = 0.98, Mid FFR = 0.66, Distal FFR = 0.63. 3. LCX: No significant stenosis. Proximal FFR = 0.99, Mid FFR = 0.93, Distal FFR = 0.93. 4. RCA: No significant stenosis. Proximal FFR = 1.00, Mid FFR = 0.94, Distal FFR =  0.93. IMPRESSION: 1. Coronary CT FFR flow analysis demonstrates a possible flow limiting lesion in the mid LAD. Proximal LAD FFR 0.98 with acute drop in flow in mid LAD with FFR 0.66. The vessel diameter just distal to lesion in mid LAD is 2.23mm-3.62mm. 2.  Recommend Cardiac Catheterization. Armanda Magic Electronically Signed   By: Armanda Magic   On: 09/29/2019 19:16   XR Foot 2 Views Left  Result Date: 09/08/2019 First MTP, PIP and DIP narrowing was noted.  No intertarsal or tibiotalar or subtalar joint space narrowing was noted.  Inferior and posterior calcaneal spurs were noted.  No erosive changes were noted. Impression: These findings are consistent with osteoarthritis of the foot.  XR Foot 2 Views Right  Result  Date: 09/08/2019 First MTP, PIP and DIP narrowing was noted.  No intertarsal or tibiotalar or subtalar joint space narrowing was noted.  Inferior and posterior calcaneal spurs were noted.  No erosive changes were noted. Impression: These findings are consistent with osteoarthritis of the foot.  XR Hand 2 View Left  Result Date: 09/08/2019 CMC, PIP and DIP narrowing was noted.  No MCP narrowing was noted.  Postsurgical changes were noted in the wrist joint.  Hardware was present in the left radius. 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  XR Hand 2 View Right  Result Date: 09/08/2019 First MCP narrowing was noted.  PIP and DIP narrowing was noted.  Spurring of right first PIP joint was noted.  No intercarpal radiocarpal joint space narrowing was noted.  No erosive changes were noted. 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   Disposition   Pt is being discharged home today in good condition.  Follow-up Plans & Appointments     Follow-up Information    Ronney Asters, NP. Go on 10/13/2019.   Specialty: Cardiology Why: @11 :15am for hospital follow up  Contact information: [address] [place] [phone]                 Discharge Medications   Allergies as of 10/05/2019      Reactions   Clindamycin/lincomycin Other (See Comments)   Severe esophagitis   Codeine Itching   Leflunomide Nausea Only   Penicillins Itching   Did it involve swelling of the face/tongue/throat, SOB, or low BP? No Did it involve sudden or severe rash/hives, skin peeling, or any reaction on the inside of your mouth or nose? No Did you need to seek medical attention at a hospital or doctor's office? No When did it last happen?2010 If all above answers are "NO", may proceed with cephalosporin use.   Statins    Muscle weakness   Tetracyclines & Related  Nausea And Vomiting   Levofloxacin Nausea Only, Palpitations      Medication List    TAKE these medications   aspirin EC 81 MG tablet Take 1 tablet (81 mg total) by mouth daily. Swallow whole.   cephALEXin 500 MG capsule Commonly known as: KEFLEX Take 2,000 mg by mouth See admin instructions. Take 2000 mg 1 hour prior to dental work   clopidogrel 75 MG tablet Commonly known as: Plavix Take 1 tablet (75 mg total) by mouth daily.   clopidogrel 75 MG tablet Commonly known as: Plavix Take 1 tablet (75 mg total) by mouth daily.   Fish Oil 1200 MG Caps Take 1,200 mg by mouth daily.   ibuprofen 200 MG tablet Commonly known as: ADVIL  Take 800 mg by mouth 2 (two) times daily as needed for headache or moderate pain.   ketoconazole 2 % cream Commonly known as: NIZORAL Apply 1 application topically daily as needed for irritation.   levothyroxine 75 MCG tablet Commonly known as: SYNTHROID Take 75 mcg by mouth daily before breakfast.   losartan-hydrochlorothiazide 100-25 MG tablet Commonly known as: HYZAAR Take 1 tablet by mouth daily.   metoprolol succinate 25 MG 24 hr tablet Commonly known as: TOPROL-XL Take 12.5 mg by mouth daily.   montelukast 10 MG tablet Commonly known as: SINGULAIR Take 10 mg by mouth at bedtime.   nitroGLYCERIN 0.4 MG SL tablet Commonly known as: NITROSTAT Place 1 tablet (0.4 mg total) under the tongue every 5 (five) minutes as needed for chest pain. Call EMS if CP is not relieved after 2 doses. You may take up to 3 doses.   Vitamin D (Ergocalciferol) 1.25 MG (50000 UNIT) Caps capsule Commonly known as: DRISDOL Take 50,000 Units by mouth every Wednesday.          Allergies Allergies  Allergen Reactions  . Clindamycin/Lincomycin Other (See Comments)    Severe esophagitis  . Codeine Itching  . Leflunomide Nausea Only  . Penicillins Itching    Did it involve swelling of the face/tongue/throat, SOB, or low BP? No Did it involve sudden or  severe rash/hives, skin peeling, or any reaction on the inside of your mouth or nose? No Did you need to seek medical attention at a hospital or doctor's office? No When did it last happen?2010 If all above answers are "NO", may proceed with cephalosporin use.   . Statins     Muscle weakness  . Tetracyclines & Related Nausea And Vomiting  . Levofloxacin Nausea Only and Palpitations    Outstanding Labs/Studies   Lipid clinic   Duration of Discharge Encounter   Greater than 30 minutes including physician time.  Signed, Manson Passey, PA 10/05/2019, 4:11 PM

## 2019-10-06 ENCOUNTER — Encounter (HOSPITAL_COMMUNITY): Payer: Self-pay | Admitting: Cardiology

## 2019-10-07 ENCOUNTER — Telehealth (HOSPITAL_COMMUNITY): Payer: Self-pay

## 2019-10-07 ENCOUNTER — Other Ambulatory Visit (HOSPITAL_COMMUNITY): Payer: Self-pay

## 2019-10-07 DIAGNOSIS — Z955 Presence of coronary angioplasty implant and graft: Secondary | ICD-10-CM

## 2019-10-12 NOTE — Progress Notes (Signed)
Cardiology Clinic Note   Patient Name: Kiara Miller Date of Encounter: 10/13/2019  Primary Care Provider:  Tracey Harries, MD Primary Cardiologist:  Bryan Lemma, MD  Patient Profile    Kiara Miller 69 year old female presents the clinic today for follow-up of her coronary artery disease status post cardiac catheterization 10/05/2019 with DES x1 to her mid LAD.  Past Medical History    Past Medical History:  Diagnosis Date   Diverticulitis April 2017   Hyperlipidemia    Hypertension    Joint pain    Osteoarthritis    Polyarthralgia    Rheumatoid arthritis (HCC)    Right knee pain    Past Surgical History:  Procedure Laterality Date   ABDOMINAL HYSTERECTOMY     CARPAL TUNNEL RELEASE     CORONARY STENT INTERVENTION N/A 10/05/2019   Procedure: CORONARY STENT INTERVENTION;  Surgeon: Marykay Lex, MD;  Location: [facility] INVASIVE CV LAB;  Service: Cardiovascular;  Laterality: N/A;   KNEE ARTHROPLASTY     LEFT HEART CATH AND CORONARY ANGIOGRAPHY N/A 10/05/2019   Procedure: LEFT HEART CATH AND CORONARY ANGIOGRAPHY;  Surgeon: Marykay Lex, MD;  Location: [facility] INVASIVE CV LAB;  Service: Cardiovascular;  Laterality: N/A;   MANDIBLE SURGERY     NM MYOVIEW LTD  06/2017   (Ordered for coronary calcium score 376) LOW RISK.  EF 72%.  No ischemia or infarction.  No wall motion normality.  No change from previous.   OSTEOTOMY PELVIS BILATERAL     TONSILLECTOMY     TONSILLECTOMY     TOTAL KNEE ARTHROPLASTY Right 09/02/2018   Procedure: TOTAL KNEE ARTHROPLASTY;  Surgeon: Durene Romans, MD;  Location: [facility];  Service: Orthopedics;  Laterality: Right;  70 mins   TRANSTHORACIC ECHOCARDIOGRAM  12/2010   EF 65-70%. Mild LVH. No RWMA/ Gr1 DD.   TUBAL LIGATION     WRIST FUSION      Allergies  Allergies  Allergen Reactions   Clindamycin/Lincomycin Other (See Comments)    Severe esophagitis   Codeine Itching   Leflunomide Nausea Only   Penicillins Itching     Did it involve swelling of the face/tongue/throat, SOB, or low BP? No Did it involve sudden or severe rash/hives, skin peeling, or any reaction on the inside of your mouth or nose? No Did you need to seek medical attention at a hospital or doctor's office? No When did it last happen?2010 If all above answers are NO, may proceed with cephalosporin use.    Statins     Muscle weakness   Tetracyclines & Related Nausea And Vomiting   Levofloxacin Nausea Only and Palpitations    History of Present Illness    Ms. Kiara Miller has a past medical history of PVCs, coronary artery disease these, essential hypertension, PACs, palpitations, dyspnea, precordial chest pain, and chronic fatigue.  She presented to the cardiology clinic 09/04/2019 and was evaluated by Dr. Herbie Baltimore.  She was previously seen by Dr. Herbie Baltimore 7/21 with complaints of heart palpitations while trying to reduce her beta-blocker.  Heart rates were noted to be in the 50s with palpitations.  Her physical activity was limited by hip and knee pain.  She was weaned off metoprolol and reassessed due to an increase in her fatigue.  She she went back on metoprolol before completing her taper.  It was felt that she had not been off of enough of the medication to notice a difference in her fatigue.  She was also noted to have dizziness and  nausea while on Plaquenil.  She also had some associated dyspnea.  She did not have any active angina at the time.  Her beta-blocker was converted to diltiazem 120 mg.  It was unsure if her lack of energy was related to her rheumatoid arthritis versus cardiovascular issues.  Her EKG showed normal sinus rhythm with incomplete right bundle branch block inferior MI undetermined age 63 bpm.  She underwent coronary CTA on 09/28/2019 which showed LAD stenosis of 50-69%.  FFR was completed and showed 0.66 flow and LAD.  She presented to the clinic 10/01/2019 with her husband for preop/precath evaluation and stated for  several months she had been increasingly short of breath.  She stated that she was only able to ambulate around her house.  She underwent total knee replacement 08/2018 and felt that since that time she had not been able to recover. She was still somewhat limited by her left ankle pain.  She had continued a high-fiber low carbohydrate diet.  Her coronary CTA and FFR results were reviewed.  She was given opportunity to ask questions about her upcoming cardiac catheterization.  Her risks and the procedure were reviewed.  She expressed understanding.  Her blood pressure was slightly elevated in the office  however, she monitored her blood pressure at home and had been running 120s over 60s.    She underwent cardiac catheterization 10/05/2019 with PCI and DES x1 to her mid LAD.  She was placed on DAPT aspirin and clopidogrel.  She presents to the clinic today for follow-up evaluation states she feels well.  She has been slowly increasing her physical activity.  She has been walking 5 minutes multiple times per day.  She is limited by her osteoarthritis in her right hip and left ankle.  She states that her breathing is better.  She wishes to do her cardiac rehab in [place] and has contacted them to arrange timing.  She is cleared for cardiac rehab today her blood pressures been well controlled she has had no side effects from her medications.  She will also need referral to lipid clinic due to statin intolerance.  I will give her the salty 6 diet sheet and have her follow-up with Dr. Herbie Baltimore in 3 months.  Today she denies increased chest pain, increased shortness of breath, lower extremity edema, fatigue, palpitations, melena, hematuria, hemoptysis, diaphoresis, weakness, presyncope, syncope, orthopnea, and PND.   Home Medications    Prior to Admission medications   Medication Sig Start Date End Date Taking? Authorizing Provider  aspirin EC 81 MG tablet Take 1 tablet (81 mg total) by mouth daily. Swallow  whole. 10/05/19   Bhagat, Sharrell Ku, PA  cephALEXin (KEFLEX) 500 MG capsule Take 2,000 mg by mouth See admin instructions. Take 2000 mg 1 hour prior to dental work 01/23/19   [provider]  clopidogrel (PLAVIX) 75 MG tablet Take 1 tablet (75 mg total) by mouth daily. 10/05/19 10/04/20  Manson Passey, PA  clopidogrel (PLAVIX) 75 MG tablet Take 1 tablet (75 mg total) by mouth daily. 10/05/19   Marykay Lex, MD  ibuprofen (ADVIL) 200 MG tablet Take 800 mg by mouth 2 (two) times daily as needed for headache or moderate pain.    [provider]  ketoconazole (NIZORAL) 2 % cream Apply 1 application topically daily as needed for irritation.    [provider]  levothyroxine (SYNTHROID, LEVOTHROID) 75 MCG tablet Take 75 mcg by mouth daily before breakfast.    [provider]  losartan-hydrochlorothiazide (  HYZAAR) 100-25 MG per tablet Take 1 tablet by mouth daily. 12/05/10   Nahser, Deloris Ping, MD  metoprolol succinate (TOPROL-XL) 25 MG 24 hr tablet Take 12.5 mg by mouth daily.    [provider]  montelukast (SINGULAIR) 10 MG tablet Take 10 mg by mouth at bedtime.      [provider]  nitroGLYCERIN (NITROSTAT) 0.4 MG SL tablet Place 1 tablet (0.4 mg total) under the tongue every 5 (five) minutes as needed for chest pain. Call EMS if CP is not relieved after 2 doses. You may take up to 3 doses. 09/30/19 12/29/19  Marykay Lex, MD  Omega-3 Fatty Acids (FISH OIL) 1200 MG CAPS Take 1,200 mg by mouth daily.     [provider]  Vitamin D, Ergocalciferol, (DRISDOL) 50000 UNITS CAPS capsule Take 50,000 Units by mouth every Wednesday.     [provider]    Family History    Family History  Problem Relation Age of Onset   Hypertension Father    Lung disease Brother    She indicated that her mother is deceased. She indicated that her father is deceased. She indicated that her sister is alive. She indicated that her brother is  deceased. She indicated that her son is alive.  Social History    Social History   Socioeconomic History   Marital status: Married    Spouse name: Not on file   Number of children: Not on file   Years of education: Not on file   Highest education level: Not on file  Occupational History   Not on file  Tobacco Use   Smoking status: Former Smoker    Packs/day: 0.50    Years: 48.00    Pack years: 24.00    Types: Cigarettes    Quit date: 08/18/2018    Years since quitting: 1.1   Smokeless tobacco: Never Used  Vaping Use   Vaping Use: Never used  Substance and Sexual Activity   Alcohol use: No   Drug use: No   Sexual activity: Not on file  Other Topics Concern   Not on file  Social History Narrative   Not on file   Social Determinants of Health   Financial Resource Strain:    Difficulty of Paying Living Expenses: Not on file  Food Insecurity:    Worried About Programme researcher, broadcasting/film/video in the Last Year: Not on file   [employer] of Food in the Last Year: Not on file  Transportation Needs:    Lack of Transportation (Medical): Not on file   Lack of Transportation (Non-Medical): Not on file  Physical Activity:    Days of Exercise per Week: Not on file   Minutes of Exercise per Session: Not on file  Stress:    Feeling of Stress : Not on file  Social Connections:    Frequency of Communication with Friends and Family: Not on file   Frequency of Social Gatherings with Friends and Family: Not on file   Attends Religious Services: Not on file   Active Member of Clubs or Organizations: Not on file   Attends Banker Meetings: Not on file   Marital Status: Not on file  Intimate Partner Violence:    Fear of Current or Ex-Partner: Not on file   Emotionally Abused: Not on file   Physically Abused: Not on file   Sexually Abused: Not on file     Review of Systems    General:  No  chills, fever, night sweats or weight changes.  Cardiovascular:   No chest pain, dyspnea on exertion, edema, orthopnea, palpitations, paroxysmal nocturnal dyspnea. Dermatological: No rash, lesions/masses Respiratory: No cough, dyspnea Urologic: No hematuria, dysuria Abdominal:   No nausea, vomiting, diarrhea, bright red blood per rectum, melena, or hematemesis Neurologic:  No visual changes, wkns, changes in mental status. All other systems reviewed and are otherwise negative except as noted above.  Physical Exam    VS:  BP 115/74    Pulse (!) 59    Temp 97.6 F (36.4 C)    Ht 5' 1.5" (1.562 m)    Wt 188 lb 9.6 oz (85.5 kg)    SpO2 98%    BMI 35.06 kg/m  , BMI Body mass index is 35.06 kg/m. GEN: Well nourished, well developed, in no acute distress. HEENT: normal. Neck: Supple, no JVD, carotid bruits, or masses. Cardiac: RRR, no murmurs, rubs, or gallops. No clubbing, cyanosis, edema.  Radials/DP/PT 2+ and equal bilaterally.  Respiratory:  Respirations regular and unlabored, clear to auscultation bilaterally. GI: Soft, nontender, nondistended, BS + x 4. MS: no deformity or atrophy. Skin: warm and dry, no rash. Neuro:  Strength and sensation are intact. Psych: Normal affect.  Accessory Clinical Findings    Recent Labs: 10/01/2019: ALT 18; BUN 25; Creatinine, Ser 0.94; Hemoglobin 15.0; Platelets 163; Potassium 3.9; Sodium 138   Recent Lipid Panel No results found for: CHOL, TRIG, HDL, CHOLHDL, VLDL, LDLCALC, LDLDIRECT  ECG personally reviewed by me today-sinus bradycardia left axis deviation anterior infarct undetermined age 70 bpm- No acute changes  Coronary CTA 09/28/2019 Aorta: Normal size. Scattered calcifications in the proximal and descending thoracic aorta. No dissection.  Aortic Valve: Trileaflet. No calcifications.  Mitral Valve: Moderate mitral annular calcifications.  Coronary Arteries: Normal coronary origin. Right dominance.  RCA is a large dominant artery that gives rise to PDA and PLVB. There is minimal calcified  plaque in the proximal RCA with associated stenosis of 0-24%. The ongoing proximal and mid RCA are diffusely diseased with mild to moderate mixed plaque with associated stenosis of 50-69%. There is mild mixed plaque in the distal RCA with associated stenosis of 25-49%. The posterolateral branch is small and diffusely diseased.  Left main is a large artery that gives rise to LAD and LCX arteries. There is minimal calcified plaque in the proximal LM with associated stenosis of 0-24%.  LAD is a large vessel. The ostial and proximal LAD are diffusely diseased with mild calcified plaque with associated stenosis of 25-49%. The LAD gives rise to a small D1 followed by a complex lesion in the mix LAD with moderate and possibly severe mixed plaque between D1 and D2 with associated stenosis of at least 50-69% and possibly > 70%.  LCX is a non-dominant artery that gives rise to one large OM1 branch. There is diffuse mild calcified plaque in the proximal and mid LCx with associated stenosis of 25-49%.  Other findings:  Normal pulmonary vein drainage into the left atrium.  Normal let atrial appendage without a thrombus.  Normal size of the pulmonary artery.  IMPRESSION: 1. Coronary calcium score of 537. This was 93rd percentile for age and sex matched control.  2. Normal coronary origin with right dominance.  3. Moderate atherosclerosis. CAD RADS 3.  4. Consider symptom-guided anti-ischemic and preventive pharmacotherapy as well as risk factor modification per guideline directed care.  5. Other treatments should be considered per guideline-directed care.  5. This study has been submitted for FFR  flow analysis.  FFR 09/29/2019 FINDINGS: FFRct analysis was performed on the original cardiac CT angiogram dataset. Diagrammatic representation of the FFRct analysis is provided in a separate PDF document in PACS. This dictation was created using the PDF document and an  interactive 3D model of the results. 3D model is not available in the EMR/PACS. Normal FFR range is >0.80.  1. Left Main: No significant stenosis. LM FFR = 1.00.  2. LAD: Possible flow limiting lesion in mid LAD. Proximal FFR = 0.98, Mid FFR = 0.66, Distal FFR = 0.63.  3. LCX: No significant stenosis. Proximal FFR = 0.99, Mid FFR = 0.93, Distal FFR = 0.93.  4. RCA: No significant stenosis. Proximal FFR = 1.00, Mid FFR = 0.94, Distal FFR = 0.93.  IMPRESSION: 1. Coronary CT FFR flow analysis demonstrates a possible flow limiting lesion in the mid LAD. Proximal LAD FFR 0.98 with acute drop in flow in mid LAD with FFR 0.66. The vessel diameter just distal to lesion in mid LAD is 2.59mm-3.62mm.  2. Recommend Cardiac Catheterization.    The left ventricular systolic function is normal. The left ventricular ejection fraction is greater than 65% by visual estimate.  LV end diastolic pressure is low.  ---------------------------------------  CULPRIT LESION: Mid LAD lesion is 85% stenosed.  A drug-eluting stent was successfully placed using a STENT RESOLUTE ONYX 2.5X15 -> postdilated 2.8 mm  Post intervention, there is a 0% residual stenosis.  ------------  Mid Cx lesion is 60% stenosed. Negative DFR  Prox RCA to Mid RCA lesion is 65% stenosed. Mid RCA lesion is 45% stenosed. Negative DFR (including the 45% mid RCA lesion.  2nd RPL lesion is 65% stenosed. (Small caliber vessel)   SUMMARY  Severe single-vessel disease with segmental 80% irregular lesion in the mid LAD between SP1 and D1 (as suggested by CT scan) ? Successful DES PCI of mid LAD reducing 80% to 0% using Resolute Onyx DES 2.5 mm x 15 mm postdilated to 2.8 mm.  Extensive moderate/nonobstructive by both CT FFR and DFR lesions in the proximal RCA (irregular 60%) and mid RCA (focal 45%) as well as proximal-mid LCx 60% eccentric lesion. ->  DFR both vessels was roughly 0.97  Normal LVEF and normal EDP.  No R  WMA.   RECOMMENDATIONS:  Plan medical management of RCA and LCx disease.  Will need aggressive lipid management --> outpatient referral to [facility] Clinical Pharmacist run Lipid Clinic  Same-day discharge.  Has follow-up set up with Oris Drone, NP next week.    Diagnostic Dominance: Right  Intervention     Assessment & Plan   1.  Coronary artery disease-no chest pain today.    Status post PCI with DES x1 to her mid LAD.  Placed on DAPT.  Tolerating all medications well without side effects. Continue aspirin, clopidogrel, nitroglycerin, fish oil, metoprolol Heart healthy low-sodium diet-salty 6 given Increase physical activity as tolerated   Hyperlipidemia-LDL 158 on 08/19/2019.  Statin intolerant.  Following up with Pharm.D. lipid clinic. Heart healthy low-sodium high-fiber diet Increase physical activity as tolerated Refer to lipid clinic.  Essential hypertension-BP today 115/74.  Well-controlled at home 120s over 60s Continue Hyzaar, metoprolol Heart healthy low-sodium diet-salty 6 given   PVCs/PACs-previously titrated off beta-blocker and placed on diltiazem.  Plan to reevaluate post cardiac catheterization.   Disposition: Follow-up with Dr. Herbie Baltimore in 3 months.  Thomasene Ripple. Cobie Marcoux NP-C    10/13/2019, 12:03 PM [facility] [address] Office [phone] Fax [phone]  Notice: This dictation was prepared with Dragon dictation along with smaller phrase technology. Any transcriptional errors that result from this process are unintentional and may not be corrected upon review.

## 2019-10-13 ENCOUNTER — Other Ambulatory Visit: Payer: Self-pay

## 2019-10-13 ENCOUNTER — Ambulatory Visit (INDEPENDENT_AMBULATORY_CARE_PROVIDER_SITE_OTHER): Payer: Medicare Other | Admitting: General Practice

## 2019-10-13 ENCOUNTER — Encounter: Payer: Self-pay | Admitting: General Practice

## 2019-10-13 VITALS — BP 115/74 | HR 59 | Temp 97.6°F | Ht 61.5 in | Wt 188.6 lb

## 2019-10-13 DIAGNOSIS — I10 Essential (primary) hypertension: Secondary | ICD-10-CM | POA: Diagnosis not present

## 2019-10-13 DIAGNOSIS — I491 Atrial premature depolarization: Secondary | ICD-10-CM

## 2019-10-13 DIAGNOSIS — I2510 Atherosclerotic heart disease of native coronary artery without angina pectoris: Secondary | ICD-10-CM

## 2019-10-13 DIAGNOSIS — I493 Ventricular premature depolarization: Secondary | ICD-10-CM | POA: Diagnosis not present

## 2019-10-13 NOTE — Patient Instructions (Signed)
Medication Instructions:  No Changes In Medications at this time.  *If you need a refill on your cardiac medications before your next appointment, please call your pharmacy*  Lab Work: None Ordered At This Time.  If you have labs (blood work) drawn today and your tests are completely normal, you will receive your results only by: Marland Kitchen MyChart Message (if you have MyChart) OR . A paper copy in the mail If you have any lab test that is abnormal or we need to change your treatment, we will call you to review the results.  Testing/Procedures: None Ordered At This Time.   Follow-Up: At [facility], you and your health needs are our priority.  As part of our continuing mission to provide you with exceptional heart care, we have created designated Provider Care Teams.  These Care Teams include your primary Cardiologist (physician) and Advanced Practice Providers (APPs -  Physician Assistants and Nurse Practitioners) who all work together to provide you with the care you need, when you need it.   Your next appointment:   3 month(s)  The format for your next appointment:   In Person  Provider:   You may see Bryan Lemma, MD or one of the following Advanced Practice Providers on your designated Care Team:    Edd Fabian NP  Other Instructions MAINTAIN SALTY SIX DIET- 1800 MG OF SODIUM OR LESS INCREASE PHYSICAL ACTIVITY AS TOLERATED

## 2019-10-26 NOTE — Progress Notes (Signed)
Patient ID: Kiara Miller                 DOB: 1951/01/08                    MRN: [id number]     HPI:  Kiara Miller is a 69 y.o. female patient of Dr Herbie Baltimore referred to lipid clinic by  Edd Fabian NP. Started managing cholesterol about 10 years ago. PMH is significant for strong family hx of early ASCVD, hypertension, CAD s/p DES x1 to her mid LAD, hyperlipidemia (baseline LDL at 190), degenerative disc disease, diverticulitis, carpal tunnel, hypothyroidism, right TKA, and uveitis.   Current Medications:  Omega-3 1200mg  daily  Intolerances:  livalo 2mg  daily - severe muscle weakness Statins - muscle weakness (mainly upper body)  LDL goal: <70 mg/dL or drop from baseline  Diet: no meal plan, mainly home cooked meals  Exercise: recumbent bike sounds 10-12 minutes twice daily  Family History: Family history dad had an MI at age 29 with a history of hypertension. Mom died in 52 2001. Had progressive neurologic dementia. As well as stenosis as a brother who is now 76 and a sister who is 53  Social History: denies alcohol or tobacco use  Labs: 08/19/2019: CHO 258, TG 219, HDL 60, LDL 158 10/22/2017: CHO 262, TG 131, HDL 46, LDL 190  Past Medical History:  Diagnosis Date  . Diverticulitis April 2017  . Hyperlipidemia   . Hypertension   . Joint pain   . Osteoarthritis   . Polyarthralgia   . Rheumatoid arthritis (HCC)   . Right knee pain     Current Outpatient Medications on File Prior to Visit  Medication Sig Dispense Refill  . aspirin EC 81 MG tablet Take 1 tablet (81 mg total) by mouth daily. Swallow whole. 30 tablet 11  . cephALEXin (KEFLEX) 500 MG capsule Take 2,000 mg by mouth See admin instructions. Take 2000 mg 1 hour prior to dental work    . clopidogrel (PLAVIX) 75 MG tablet Take 1 tablet (75 mg total) by mouth daily. 30 tablet 11  . ibuprofen (ADVIL) 200 MG tablet Take 800 mg by mouth 2 (two) times daily as needed for headache or moderate pain.    10/24/2017 ketoconazole  (NIZORAL) 2 % cream Apply 1 application topically daily as needed for irritation.    May 2017 levothyroxine (SYNTHROID, LEVOTHROID) 75 MCG tablet Take 75 mcg by mouth daily before breakfast.    . losartan-hydrochlorothiazide (HYZAAR) 100-25 MG per tablet Take 1 tablet by mouth daily.    . metoprolol succinate (TOPROL-XL) 25 MG 24 hr tablet Take 12.5 mg by mouth daily.    . montelukast (SINGULAIR) 10 MG tablet Take 10 mg by mouth at bedtime.      . nitroGLYCERIN (NITROSTAT) 0.4 MG SL tablet Place 1 tablet (0.4 mg total) under the tongue every 5 (five) minutes as needed for chest pain. Call EMS if CP is not relieved after 2 doses. You may take up to 3 doses. 90 tablet 3  . Omega-3 Fatty Acids (FISH OIL) 1200 MG CAPS Take 1,200 mg by mouth daily.     . Vitamin D, Ergocalciferol, (DRISDOL) 50000 UNITS CAPS capsule Take 50,000 Units by mouth every Wednesday.      Current Facility-Administered Medications on File Prior to Visit  Medication Dose Route Frequency Provider Last Rate Last Admin  . acetaminophen (TYLENOL) tablet 650 mg  650 mg Oral 60 min Pre-Op Marland Kitchen, MD      .  diphenhydrAMINE (BENADRYL) capsule 25 mg  25 mg Oral 60 min Pre-Op Pollyann Savoy, MD        Allergies  Allergen Reactions  . Clindamycin/Lincomycin Other (See Comments)    Severe esophagitis  . Codeine Itching  . Leflunomide Nausea Only  . Penicillins Itching    Did it involve swelling of the face/tongue/throat, SOB, or low BP? No Did it involve sudden or severe rash/hives, skin peeling, or any reaction on the inside of your mouth or nose? No Did you need to seek medical attention at a hospital or doctor's office? No When did it last happen?2010 If all above answers are "NO", may proceed with cephalosporin use.   . Statins     Muscle weakness  . Tetracyclines & Related Nausea And Vomiting  . Levofloxacin Nausea Only and Palpitations    Hyperlipidemia LDL goal <70 LDL above goal for secondary prevention.  She was unable to tolerate statins in the past. Noted severe muscle weakness with Livalo 2mg . Her LDL was 190 mg/dL on and has strong family history of early ASCVD with MIN in father at age 68.   We discuss Nexletol, Nexlizet, and PCSK9i therapies as options to manage her hyperlipidemia. Will initiate PA for PCSK9i (Repatha/Praluent) and follow up as needed. Plan to repeat fasting blood work after 4 doses of new medication received.    Taos Tapp Rodriguez-Guzman PharmD, BCPS, CPP [facility] [address] [place] 10/27/2019 3:41 PM

## 2019-10-26 NOTE — Telephone Encounter (Signed)
APPT SCHEDULED FOR 10-27-2019

## 2019-10-27 ENCOUNTER — Ambulatory Visit (INDEPENDENT_AMBULATORY_CARE_PROVIDER_SITE_OTHER): Payer: Medicare Other | Admitting: Pharmacist

## 2019-10-27 ENCOUNTER — Other Ambulatory Visit: Payer: Self-pay

## 2019-10-27 DIAGNOSIS — E785 Hyperlipidemia, unspecified: Secondary | ICD-10-CM | POA: Insufficient documentation

## 2019-10-27 DIAGNOSIS — I2510 Atherosclerotic heart disease of native coronary artery without angina pectoris: Secondary | ICD-10-CM

## 2019-10-27 NOTE — Assessment & Plan Note (Signed)
LDL above goal for secondary prevention. She was unable to tolerate statins in the past. Noted severe muscle weakness with Livalo 2mg . Her LDL was 190 mg/dL on and has strong family history of early ASCVD with MIN in father at age 70.   We discuss Nexletol, Nexlizet, and PCSK9i therapies as options to manage her hyperlipidemia. Will initiate PA for PCSK9i (Repatha/Praluent) and follow up as needed. Plan to repeat fasting blood work after 4 doses of new medication received.

## 2019-10-27 NOTE — Patient Instructions (Signed)
Your Results:             Your most recent labs Goal  Total Cholesterol 258 < 200  Triglycerides 219 < 150  HDL (happy/good cholesterol) 60 > 40  LDL (lousy/bad cholesterol 158 < 70     Medication changes: * START prior authorization for Repatha/Praluent every 14 days*  Lab orders: *Plan to repeat fasting blood work after 4 doses of new medication*  Clinic phone number: [phone] Adama Ferber/Kristin/Haleigh  Thank you for choosing [facility]

## 2019-10-29 ENCOUNTER — Telehealth: Payer: Self-pay

## 2019-10-29 DIAGNOSIS — E785 Hyperlipidemia, unspecified: Secondary | ICD-10-CM

## 2019-10-29 MED ORDER — PRALUENT 150 MG/ML ~~LOC~~ SOAJ: 150.0000 mg | SUBCUTANEOUS | 11 refills | Status: DC

## 2019-10-29 NOTE — Telephone Encounter (Signed)
Called and spoke w/pt regarding the approval of the praluent 150 and rx sent, instructed the pt to come in for labs for lipid 2 mos post 1st dose, pt voiced understanding

## 2019-11-10 ENCOUNTER — Other Ambulatory Visit: Payer: Self-pay | Admitting: Family Medicine

## 2019-11-10 DIAGNOSIS — Z1231 Encounter for screening mammogram for malignant neoplasm of breast: Secondary | ICD-10-CM

## 2019-11-24 NOTE — Progress Notes (Signed)
Office Visit Note  Patient: Kiara Miller             Date of Birth: 11/16/1950           MRN: [id number]             PCP: Tracey Harries, MD Referring: Tracey Harries, MD Visit Date: 12/08/2019 Occupation: @GUAROCC @  Subjective:  Pain in multiple joints  History of Present Illness: Kiara Miller is a 69 y.o. female with history of seronegative rheumatoid arthritis, osteoarthritis, and DDD.  She is not taking any immunosuppressive agents at this time.  She states that since her last visit she had a stent placed for a coronary artery blockage.  She states that since then her chest pain and shortness of breath have improved.  She was unable to go to cardiac rehab due to no availabilities for a couple of months.  She states that she purchased a recumbent bike and has been trying to bike 5 to 6 days/week for 20 minutes twice daily. She has ongoing neck and lower back pain.  She continues to have pain in both hands but denies any joint swelling. She has chronic pain in the left ankle joint. Her right knee replacement is doing well.     Activities of Daily Living:  Patient reports morning stiffness for 1.5  hours.   Patient Denies nocturnal pain.  Difficulty dressing/grooming: Reports Difficulty climbing stairs: Reports Difficulty getting out of chair: Reports Difficulty using hands for taps, buttons, cutlery, and/or writing: Reports  Review of Systems  Constitutional: Positive for fatigue.  HENT: Positive for mouth dryness. Negative for mouth sores and nose dryness.   Eyes: Negative for pain, visual disturbance and dryness.  Respiratory: Positive for shortness of breath. Negative for cough, hemoptysis and difficulty breathing.   Cardiovascular: Negative for chest pain, palpitations, hypertension and swelling in legs/feet.  Gastrointestinal: Negative for blood in stool, constipation and diarrhea.  Endocrine: Negative for increased urination.  Genitourinary: Negative for painful  urination.  Musculoskeletal: Positive for arthralgias, joint pain, joint swelling, muscle weakness, morning stiffness and muscle tenderness. Negative for myalgias and myalgias.  Skin: Positive for rash. Negative for color change, pallor, hair loss, nodules/bumps, skin tightness, ulcers and sensitivity to sunlight.  Allergic/Immunologic: Negative for susceptible to infections.  Neurological: Positive for weakness. Negative for dizziness, numbness and headaches.  Hematological: Positive for bruising/bleeding tendency. Negative for swollen glands.  Psychiatric/Behavioral: Positive for sleep disturbance. Negative for depressed mood. The patient is not nervous/anxious.     PMFS History:  Patient Active Problem List   Diagnosis Date Noted  . Hyperlipidemia LDL goal <70 10/27/2019  . Progressive angina (HCC) 10/05/2019  . Abnormal cardiac CT angiography 10/05/2019  . Feeling of chest tightness 09/04/2019  . Chronic fatigue 04/09/2019  . Obese 09/03/2018  . 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 PAC (premature atrial contraction) 07/11/2017  . History of hypothyroidism 06/11/2017  . Essential hypertension 06/11/2017  . History of obesity 06/11/2017  . S/P right knee arthroscopy 06/11/2017  . DDD (degenerative disc disease), cervical 06/11/2017  . DDD (degenerative disc disease), lumbar 06/11/2017  . History of carpal tunnel surgery of right wrist 06/11/2017  . Primary osteoarthritis of both knees 06/11/2017  . Chest pain, precordial 05/05/2017  . PVC's (premature ventricular contractions) 05/03/2017  . 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 Uveitis 10/10/2015  . Diverticulitis 10/10/2015  . Dyspnea  12/05/2010  . Palpitations 12/05/2010    Past Medical History:  Diagnosis Date  . Diverticulitis April 2017  . Hyperlipidemia   . Hypertension   . Joint pain   . Osteoarthritis    . Polyarthralgia   . Rheumatoid arthritis (HCC)   . Right knee pain     Family History  Problem Relation Age of Onset  . Hypertension Father   . Lung disease Brother    Past Surgical History:  Procedure Laterality Date  . ABDOMINAL HYSTERECTOMY    . CARPAL TUNNEL RELEASE    . CORONARY STENT INTERVENTION N/A 10/05/2019   Procedure: CORONARY STENT INTERVENTION;  Surgeon: Marykay Lex, MD;  Location: [facility] INVASIVE CV LAB;  Service: Cardiovascular;  Laterality: N/A;  . KNEE ARTHROPLASTY    . LEFT HEART CATH AND CORONARY ANGIOGRAPHY N/A 10/05/2019   Procedure: LEFT HEART CATH AND CORONARY ANGIOGRAPHY;  Surgeon: Marykay Lex, MD;  Location: [facility] INVASIVE CV LAB;  Service: Cardiovascular;  Laterality: N/A;  . MANDIBLE SURGERY    . NM MYOVIEW LTD  06/2017   (Ordered for coronary calcium score 376) LOW RISK.  EF 72%.  No ischemia or infarction.  No wall motion normality.  No change from previous.  . OSTEOTOMY PELVIS BILATERAL    . TONSILLECTOMY    . TONSILLECTOMY    . TOTAL KNEE ARTHROPLASTY Right 09/02/2018   Procedure: TOTAL KNEE ARTHROPLASTY;  Surgeon: Durene Romans, MD;  Location: [facility];  Service: Orthopedics;  Laterality: Right;  70 mins  . TRANSTHORACIC ECHOCARDIOGRAM  12/2010   EF 65-70%. Mild LVH. No RWMA/ Gr1 DD.  . TUBAL LIGATION    . WRIST FUSION     Social History   Social History Narrative  . Not on file   Immunization History  Administered Date(s) Administered  . PFIZER SARS-COV-2 Vaccination 02/25/2019, 03/19/2019  . Pneumococcal Polysaccharide-23 07/07/2012     Objective: Vital Signs: BP 129/83 (BP Location: Left Arm, Patient Position: Sitting, Cuff Size: Normal)   Pulse (!) 58   Resp 17   Ht 5' 1.5" (1.562 m)   Wt 189 lb (85.7 kg)   BMI 35.13 kg/m    Physical Exam Vitals and nursing note reviewed.  Constitutional:      Appearance: She is well-developed.  HENT:     Head: Normocephalic and atraumatic.  Eyes:     Conjunctiva/sclera: Conjunctivae  normal.  Pulmonary:     Effort: Pulmonary effort is normal.  Abdominal:     Palpations: Abdomen is soft.  Musculoskeletal:     Cervical back: Normal range of motion.  Skin:    General: Skin is warm and dry.     Capillary Refill: Capillary refill takes less than 2 seconds.  Neurological:     Mental Status: She is alert and oriented to person, place, and time.  Psychiatric:        Behavior: Behavior normal.      Musculoskeletal Exam: C-spine limited ROM with lateral rotation. Mild postural thoracic kyphosis.  No midline spinal tenderness or SI joint tenderness.  Shoulder joints, elbow joints, wrist joints, MCPs, PIPs, and DIPs good ROM with no synovitis.  Complete fist formation bilaterally.  PIP and DIP thickening consistent with osteoarthritis of both hands.  Subluxation of both 1st MCP joints.  CMC joint prominence bilaterally.  Right knee replacement has good ROM.  Left knee good ROM with no discomfort. Ankle joints good ROM with no tenderness or inflammation.  No tenderness of MTP joints.   CDAI  Exam: CDAI Score: 0.7  Patient Global: 4 mm; Provider Global: 3 mm Swollen: 0 ; Tender: 0  Joint Exam 12/08/2019   No joint exam has been documented for this visit   There is currently no information documented on the homunculus. Go to the Rheumatology activity and complete the homunculus joint exam.  Investigation: No additional findings.  Imaging: No results found.  Recent Labs: Lab Results  Component Value Date   WBC 6.0 10/01/2019   HGB 15.0 10/01/2019   PLT 163 10/01/2019   NA 138 10/01/2019   K 3.9 10/01/2019   CL 100 10/01/2019   CO2 23 10/01/2019   GLUCOSE 86 10/01/2019   BUN 25 10/01/2019   CREATININE 0.94 10/01/2019   BILITOT 0.7 10/01/2019   ALKPHOS 49 10/01/2019   AST 15 10/01/2019   ALT 18 10/01/2019   PROT 6.8 10/01/2019   ALBUMIN 4.4 10/01/2019   CALCIUM 9.7 10/01/2019   GFRAA 71 10/01/2019   QFTBGOLD Negative 08/30/2016    Speciality Comments:  Orencia IV 750mg  every 4 weeks, TB gold negative 08/30/16  PLQ Eye Exam: 03/09/2019 WNL @ [facility] Follow up in 6 months.  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  She has no synovitis on examination today.  She is not taking any immunosuppressive agents at this time.  She tried taking Plaquenil in April 2021 but discontinued after 2 months due to not noticing any clinical improvement.  She continues to have chronic pain in multiple joints but has no inflammation on examination today.  She has PIP and DIP thickening consistent with osteoarthritis of both hands which seems to be causing most of her discomfort.  She does not require immunosuppressive therapy at this time.  She was encouraged to continue to exercise on a daily basis.  She recently purchased a recumbent bike and has been riding 5-6 days a week for 20 minutes twice daily.  She was advised to notify us if she develops signs or symptoms of a flare.  She will follow-up in the office in 5 months.  High risk medication use - Plaquenil was started in April 2021-Discontinued after 2 months due to not noticing any clinical improvement.  She does not require immunosuppressive therapy at this time.   Status post fusion of wrist (left): Limited ROM.  No synovitis noted on exam.    Primary osteoarthritis of left knee: She has good ROM of the left knee joint.  No warmth or effusion on exam today. She was encouraged to continue to exercise on a daily basis.    S/P total knee arthroplasty, right: Doing well.  She has good ROM with no warmth or effusion.   DDD (degenerative disc disease), cervical: She  has slightly limited ROM with lateral rotation.  She has ongoing pain in the C-spine.  No symptoms of radiculopathy.   DDD (degenerative disc disease), lumbar - She was previously followed by Dr. Laurian Brim. She has been experiencing increased lower back pain.  No symptoms of radiculopathy. We discussed a referral to PT but she declined.  She was advised to notify us if her back pain persists or worsens.   Abnormal cardiac CT angiography: She had a cardiac stent placed several months ago.  She is currently on plavix. She purchased a recumbent bike and has been riding 5-6 days a week for 20 mg twice daily.  She will not be going to cardiac rehab.   Other medical conditions are listed as follows:   Coccyx pain - Resolved.   History of diverticulitis  History of hypertension  History of carpal tunnel surgery of right wrist  History of hypothyroidism  Orders: No orders of the defined types were placed in this encounter.  No orders of the defined types were placed in this encounter.    Follow-Up Instructions: Return in about 6 months (around 06/06/2020) for Rheumatoid arthritis, Osteoarthritis, DDD.   Gearldine Bienenstock, PA-C  Note - This record has been created using Dragon software.  Chart creation errors have been sought, but may not always  have been located. Such creation errors do not reflect on  the standard of medical care.

## 2019-12-08 ENCOUNTER — Encounter: Payer: Self-pay | Admitting: Physician Assistant

## 2019-12-08 ENCOUNTER — Other Ambulatory Visit: Payer: Self-pay

## 2019-12-08 ENCOUNTER — Ambulatory Visit (INDEPENDENT_AMBULATORY_CARE_PROVIDER_SITE_OTHER): Payer: Medicare Other | Admitting: Physician Assistant

## 2019-12-08 VITALS — BP 129/83 | HR 58 | Resp 17 | Ht 61.5 in | Wt 189.0 lb

## 2019-12-08 DIAGNOSIS — Z981 Arthrodesis status: Secondary | ICD-10-CM

## 2019-12-08 DIAGNOSIS — Z9889 Other specified postprocedural states: Secondary | ICD-10-CM

## 2019-12-08 DIAGNOSIS — M5030 Other cervical disc degeneration, unspecified cervical region: Secondary | ICD-10-CM

## 2019-12-08 DIAGNOSIS — M0609 Rheumatoid arthritis without rheumatoid factor, multiple sites: Secondary | ICD-10-CM | POA: Diagnosis not present

## 2019-12-08 DIAGNOSIS — M5136 Other intervertebral disc degeneration, lumbar region: Secondary | ICD-10-CM

## 2019-12-08 DIAGNOSIS — Z8679 Personal history of other diseases of the circulatory system: Secondary | ICD-10-CM

## 2019-12-08 DIAGNOSIS — R931 Abnormal findings on diagnostic imaging of heart and coronary circulation: Secondary | ICD-10-CM

## 2019-12-08 DIAGNOSIS — M1712 Unilateral primary osteoarthritis, left knee: Secondary | ICD-10-CM | POA: Diagnosis not present

## 2019-12-08 DIAGNOSIS — M533 Sacrococcygeal disorders, not elsewhere classified: Secondary | ICD-10-CM

## 2019-12-08 DIAGNOSIS — Z96651 Presence of right artificial knee joint: Secondary | ICD-10-CM

## 2019-12-08 DIAGNOSIS — Z8639 Personal history of other endocrine, nutritional and metabolic disease: Secondary | ICD-10-CM

## 2019-12-08 DIAGNOSIS — Z79899 Other long term (current) drug therapy: Secondary | ICD-10-CM | POA: Diagnosis not present

## 2019-12-08 DIAGNOSIS — Z8719 Personal history of other diseases of the digestive system: Secondary | ICD-10-CM

## 2019-12-21 LAB — LIPID PANEL
Chol/HDL Ratio: 3.2 ratio (ref 0.0–4.4)
Cholesterol, Total: 165 mg/dL (ref 100–199)
HDL: 51 mg/dL (ref >39)
LDL Chol Calc (NIH): 81 mg/dL (ref 0–99)
Triglycerides: 199 mg/dL — ABNORMAL HIGH (ref 0–149)
VLDL Cholesterol Cal: 33 mg/dL (ref 5–40)

## 2019-12-22 ENCOUNTER — Telehealth: Payer: Self-pay

## 2019-12-22 NOTE — Telephone Encounter (Signed)
No changes recommended at this time.  Continue Praluent 150mg  every 14 days and positive diet modifications as well. Plan to repeat fasting lipid in 6 months.

## 2019-12-22 NOTE — Telephone Encounter (Signed)
Pt called requesting lipid panel results. Also wants to make sure prednisone will not affect praluent or plavix.

## 2019-12-29 ENCOUNTER — Ambulatory Visit
Admission: RE | Admit: 2019-12-29 | Discharge: 2019-12-29 | Disposition: A | Discharge status: Home / Self Care | Payer: Medicare Other | Source: Ambulatory Visit | Attending: Family Medicine | Admitting: Family Medicine

## 2019-12-29 ENCOUNTER — Other Ambulatory Visit: Payer: Self-pay

## 2019-12-29 DIAGNOSIS — Z1231 Encounter for screening mammogram for malignant neoplasm of breast: Secondary | ICD-10-CM

## 2020-01-10 NOTE — Progress Notes (Unsigned)
Cardiology Clinic Note   Patient Name: Kiara Miller Date of Encounter: 01/12/2020  Primary Care Provider:  Tracey Harries, MD Primary Cardiologist:  Kiara Lemma, MD  Patient Profile    Kiara Miller 69 year old female presents the clinic today for follow-up of her coronary artery disease status post cardiac catheterization 10/05/2019 with DES x1 to her mid LAD.   Past Medical History    Past Medical History:  Diagnosis Date  . Diverticulitis April 2017  . Hyperlipidemia   . Hypertension   . Joint pain   . Osteoarthritis   . Polyarthralgia   . Rheumatoid arthritis (HCC)   . Right knee pain    Past Surgical History:  Procedure Laterality Date  . ABDOMINAL HYSTERECTOMY    . CARPAL TUNNEL RELEASE    . CORONARY STENT INTERVENTION N/A 10/05/2019   Procedure: CORONARY STENT INTERVENTION;  Surgeon: Kiara Lex, MD;  Location: [facility] INVASIVE CV LAB;  Service: Cardiovascular;  Laterality: N/A;  . KNEE ARTHROPLASTY    . LEFT HEART CATH AND CORONARY ANGIOGRAPHY N/A 10/05/2019   Procedure: LEFT HEART CATH AND CORONARY ANGIOGRAPHY;  Surgeon: Kiara Lex, MD;  Location: [facility] INVASIVE CV LAB;  Service: Cardiovascular;  Laterality: N/A;  . MANDIBLE SURGERY    . NM MYOVIEW LTD  06/2017   (Ordered for coronary calcium score 376) LOW RISK.  EF 72%.  No ischemia or infarction.  No wall motion normality.  No change from previous.  . OSTEOTOMY PELVIS BILATERAL    . TONSILLECTOMY    . TONSILLECTOMY    . TOTAL KNEE ARTHROPLASTY Right 09/02/2018   Procedure: TOTAL KNEE ARTHROPLASTY;  Surgeon: Durene Romans, MD;  Location: [facility] ORS;  Service: Orthopedics;  Laterality: Right;  70 mins  . TRANSTHORACIC ECHOCARDIOGRAM  12/2010   EF 65-70%. Mild LVH. No RWMA/ Gr1 DD.  . TUBAL LIGATION    . WRIST FUSION      Allergies  Allergies  Allergen Reactions  . Clindamycin/Lincomycin Other (See Comments)    Severe esophagitis  . Codeine Itching  . Leflunomide Nausea Only  . Penicillins Itching     Did it involve swelling of the face/tongue/throat, SOB, or low BP? No Did it involve sudden or severe rash/hives, skin peeling, or any reaction on the inside of your mouth or nose? No Did you need to seek medical attention at a hospital or doctor's office? No When did it last happen?2010 If all above answers are "NO", may proceed with cephalosporin use.   . Statins     Muscle weakness  . Tetracyclines & Related Nausea And Vomiting  . Levofloxacin Nausea Only and Palpitations    History of Present Illness    [name] has a past medical history of PVCs, coronary artery disease these, essential hypertension, PACs, palpitations, dyspnea, precordial chest pain, and chronic fatigue.  She presented to the cardiology clinic 09/04/2019 and was evaluated by Dr. Herbie Miller. She was previously seen by Dr. Herbie Miller 7/21 with complaints of heart palpitations while trying to reduce her beta-blocker. Heart rates were noted to be in the 50s with palpitations. Her physical activity was limited by hip and knee pain. She was weaned off metoprolol and reassessed due to an increase in her fatigue. She she went back on metoprolol before completing her taper. It was felt that she had not been off of enough of the medication to notice a difference in her fatigue. She was also noted to have dizziness and nausea while on Plaquenil. She also  had some associated dyspnea. She did not have any active angina at the time. Her beta-blocker was converted to diltiazem 120 mg. It was unsure if her lack of energy was related to her rheumatoid arthritis versus cardiovascular issues. Her EKG showed normal sinus rhythm with incomplete right bundle branch block inferior MI undetermined age 60 bpm. She underwent coronary CTA on 09/28/2019 which showed LAD stenosis of 50-69%. FFR was completed and showed 0.66 flow and LAD.  She presented to the clinic 8/26/2021with her husbandfor preop/precath evaluation and stated  for several months she had been increasingly short of breath. She stated that she was only able to ambulate around her house. She underwent total knee replacement 08/2018 and felt that since that time she had not been able to recover.She was still somewhat limited by her left ankle pain. She had continued a high-fiber low carbohydrate diet. Her coronary CTA and FFR results were reviewed. She was given opportunity to ask questions about her upcoming cardiac catheterization. Her risks and the procedure were reviewed. She expressed understanding. Her blood pressure was slightly elevated in the office  however, she monitored her blood pressure at home and had been running 120s over 60s.   She underwent cardiac catheterization 10/05/2019 with PCI and DES x1 to her mid LAD.  She was placed on DAPT aspirin and clopidogrel.  She presented to the clinic on 10/13/19 for follow-up evaluation stated she felt well.  She had been slowly increasing her physical activity.  She had been walking 5 minutes multiple times per day.  She was limited by her osteoarthritis in her right hip and left ankle.  She stated that her breathing was getting better.  She wished to do her cardiac rehab in [place] and had contacted them to arrange timing.  She was cleared for cardiac rehab and her blood pressures were well controlled.  She had no side effects from her medications.  She was also referred to lipid clinic due to statin intolerance.  I gave her the salty 6 diet sheet and planned follow-up with Dr. Herbie Miller in 3 months.  She presents the clinic today for follow-up evaluation states she feels well.  She has not noticed heart palpitations or irregular beats since using metoprolol.  She has been seen at the lipid clinic and placed on Praluent.  Her LDL has decreased substantially.  She has been limited in her physical activity due to her left hip and back pain.  She is seeing orthopedics who have tried to treat conservatively with  steroids.  However, she has not had much relief with this.  She also reports some abdominal bloating/swelling and a 4 pound weight gain over the last 4 days.  She has not added salt to her diet but does note that she is urinating somewhat less.  She also notes some swelling in her right cheek that is also been present for 2 weeks.  Upon ENT exam no other abnormalities noted.  I will have her follow-up with PCP for her right cheek swelling.  I will order a BMP today, give her 20 mg of furosemide x3 days along with 10 mEq of potassium for 3 days then stop.  I will have her increase her physical activity as tolerated and follow-up with Dr. Herbie Miller in 6 months.  Today shedenies increasedchest pain,increasedshortness of breath, lower extremity edema, fatigue, palpitations, melena, hematuria, hemoptysis, diaphoresis, weakness, presyncope, syncope, orthopnea, and PND.   Home Medications    Prior to Admission medications   Medication  Sig Start Date End Date Taking? Authorizing Provider  Alirocumab (PRALUENT) 150 MG/ML SOAJ Inject 150 mg into the skin every 14 (fourteen) days. 10/29/19   Kiara Lex, MD  aspirin EC 81 MG tablet Take 1 tablet (81 mg total) by mouth daily. Swallow whole. 10/05/19   Bhagat, Sharrell Ku, PA  cephALEXin (KEFLEX) 500 MG capsule Take 2,000 mg by mouth See admin instructions. Take 2000 mg 1 hour prior to dental work 01/23/19   [provider]  clopidogrel (PLAVIX) 75 MG tablet Take 1 tablet (75 mg total) by mouth daily. 10/05/19 10/04/20  Manson Passey, PA  ibuprofen (ADVIL) 200 MG tablet Take 800 mg by mouth 2 (two) times daily as needed for headache or moderate pain.    [provider]  ketoconazole (NIZORAL) 2 % cream Apply 1 application topically daily as needed for irritation.    [provider]  levothyroxine (SYNTHROID, LEVOTHROID) 75 MCG tablet Take 75 mcg by mouth daily before breakfast.    [provider]   losartan-hydrochlorothiazide (HYZAAR) 100-25 MG per tablet Take 1 tablet by mouth daily. 12/05/10   Nahser, Deloris Ping, MD  metoprolol succinate (TOPROL-XL) 25 MG 24 hr tablet Take 12.5 mg by mouth daily.    [provider]  montelukast (SINGULAIR) 10 MG tablet Take 10 mg by mouth at bedtime.      [provider]  nitroGLYCERIN (NITROSTAT) 0.4 MG SL tablet Place 1 tablet (0.4 mg total) under the tongue every 5 (five) minutes as needed for chest pain. Call EMS if CP is not relieved after 2 doses. You may take up to 3 doses. 09/30/19 12/29/19  Kiara Lex, MD  Omega-3 Fatty Acids (FISH OIL) 1200 MG CAPS Take 1,200 mg by mouth daily.     [provider]  Vitamin D, Ergocalciferol, (DRISDOL) 50000 UNITS CAPS capsule Take 50,000 Units by mouth every Wednesday.     [provider]    Family History    Family History  Problem Relation Age of Onset  . Hypertension Father   . Lung disease Brother    She indicated that her mother is deceased. She indicated that her father is deceased. She indicated that her sister is alive. She indicated that her brother is deceased. She indicated that her son is alive.  Social History    Social History   Socioeconomic History  . Marital status: Married    Spouse name: Not on file  . Number of children: Not on file  . Years of education: Not on file  . Highest education level: Not on file  Occupational History  . Not on file  Tobacco Use  . Smoking status: Former Smoker    Packs/day: 0.50    Years: 48.00    Pack years: 24.00    Types: Cigarettes    Quit date: 08/18/2018    Years since quitting: 1.4  . Smokeless tobacco: Never Used  Vaping Use  . Vaping Use: Never used  Substance and Sexual Activity  . Alcohol use: No  . Drug use: No  . Sexual activity: Not on file  Other Topics Concern  . Not on file  Social History Narrative  . Not on file   Social Determinants of Health   Financial Resource Strain:   .  Difficulty of Paying Living Expenses: Not on file  Food Insecurity:   . Worried About Programme researcher, broadcasting/film/video in the Last Year: Not on file  . Ran Out of Food in the  Last Year: Not on file  Transportation Needs:   . Lack of Transportation (Medical): Not on file  . Lack of Transportation (Non-Medical): Not on file  Physical Activity:   . Days of Exercise per Week: Not on file  . Minutes of Exercise per Session: Not on file  Stress:   . Feeling of Stress : Not on file  Social Connections:   . Frequency of Communication with Friends and Family: Not on file  . Frequency of Social Gatherings with Friends and Family: Not on file  . Attends Religious Services: Not on file  . Active Member of Clubs or Organizations: Not on file  . Attends Banker Meetings: Not on file  . Marital Status: Not on file  Intimate Partner Violence:   . Fear of Current or Ex-Partner: Not on file  . Emotionally Abused: Not on file  . Physically Abused: Not on file  . Sexually Abused: Not on file     Review of Systems    General:  No chills, fever, night sweats or weight changes.  Cardiovascular:  No chest pain, dyspnea on exertion, edema, orthopnea, palpitations, paroxysmal nocturnal dyspnea. Dermatological: No rash, lesions/masses Respiratory: No cough, dyspnea Urologic: No hematuria, dysuria Abdominal:   No nausea, vomiting, diarrhea, bright red blood per rectum, melena, or hematemesis Neurologic:  No visual changes, wkns, changes in mental status. All other systems reviewed and are otherwise negative except as noted above.  Physical Exam    VS:  BP 132/88 (BP Location: Left Arm, Patient Position: Sitting, Cuff Size: Large)   Pulse 70   Ht 5\' 1"  (1.549 m)   Wt 191 lb 3.2 oz (86.7 kg)   BMI 36.13 kg/m  , BMI Body mass index is 36.13 kg/m. GEN: Well nourished, well developed, in no acute distress. HEENT: Right cheek nonpitting edema-slight Neck: Supple, no JVD, carotid bruits, or masses.  Cardiac: RRR, no murmurs, rubs, or gallops. No clubbing, cyanosis, edema.  Radials/DP/PT 2+ and equal bilaterally.  Respiratory:  Respirations regular and unlabored, clear to auscultation bilaterally. GI: Soft, nontender, nondistended, BS + x 4.  Slight abdominal edema MS: no deformity or atrophy. Skin: warm and dry, no rash. Neuro:  Strength and sensation are intact. Psych: Normal affect.  Accessory Clinical Findings    Recent Labs: 10/01/2019: ALT 18; BUN 25; Creatinine, Ser 0.94; Hemoglobin 15.0; Platelets 163; Potassium 3.9; Sodium 138   Recent Lipid Panel    Component Value Date/Time   CHOL 165 12/21/2019 0929   TRIG 199 (H) 12/21/2019 0929   HDL 51 12/21/2019 0929   CHOLHDL 3.2 12/21/2019 0929   LDLCALC 81 12/21/2019 0929    ECG personally reviewed by me today-none today.  EKG 10/13/2019 sinus bradycardia left axis deviation anterior infarct undetermined age 35 bpm- No acute changes  Coronary CTA 09/28/2019 Aorta: Normal size. Scattered calcifications in the proximal and descending thoracic aorta. No dissection.  Aortic Valve: Trileaflet. No calcifications.  Mitral Valve: Moderate mitral annular calcifications.  Coronary Arteries: Normal coronary origin. Right dominance.  RCA is a large dominant artery that gives rise to PDA and PLVB. There is minimal calcified plaque in the proximal RCA with associated stenosis of 0-24%. The ongoing proximal and mid RCA are diffusely diseased with mild to moderate mixed plaque with associated stenosis of 50-69%. There is mild mixed plaque in the distal RCA with associated stenosis of 25-49%. The posterolateral branch is small and diffusely diseased.  Left main is a large artery that gives rise to  LAD and LCX arteries. There is minimal calcified plaque in the proximal LM with associated stenosis of 0-24%.  LAD is a large vessel. The ostial and proximal LAD are diffusely diseased with mild calcified plaque with  associated stenosis of 25-49%. The LAD gives rise to a small D1 followed by a complex lesion in the mix LAD with moderate and possibly severe mixed plaque between D1 and D2 with associated stenosis of at least 50-69% and possibly > 70%.  LCX is a non-dominant artery that gives rise to one large OM1 branch. There is diffuse mild calcified plaque in the proximal and mid LCx with associated stenosis of 25-49%.  Other findings:  Normal pulmonary vein drainage into the left atrium.  Normal let atrial appendage without a thrombus.  Normal size of the pulmonary artery.  IMPRESSION: 1. Coronary calcium score of 537. This was 93rd percentile for age and sex matched control.  2. Normal coronary origin with right dominance.  3. Moderate atherosclerosis. CAD RADS 3.  4. Consider symptom-guided anti-ischemic and preventive pharmacotherapy as well as risk factor modification per guideline directed care.  5. Other treatments should be considered per guideline-directed care.  5. This study has been submitted for FFR flow analysis.  FFR 09/29/2019 FINDINGS: FFRct analysis was performed on the original cardiac CT angiogram dataset. Diagrammatic representation of the FFRct analysis is provided in a separate PDF document in PACS. This dictation was created using the PDF document and an interactive 3D model of the results. 3D model is not available in the EMR/PACS. Normal FFR range is >0.80.  1. Left Main: No significant stenosis. LM FFR = 1.00.  2. LAD: Possible flow limiting lesion in mid LAD. Proximal FFR = 0.98, Mid FFR = 0.66, Distal FFR = 0.63.  3. LCX: No significant stenosis. Proximal FFR = 0.99, Mid FFR = 0.93, Distal FFR = 0.93.  4. RCA: No significant stenosis. Proximal FFR = 1.00, Mid FFR = 0.94, Distal FFR = 0.93.  IMPRESSION: 1. Coronary CT FFR flow analysis demonstrates a possible flow limiting lesion in the mid LAD. Proximal LAD FFR 0.98 with  acute drop in flow in mid LAD with FFR 0.66. The vessel diameter just distal to lesion in mid LAD is 2.71mm-3.62mm.  2. Recommend Cardiac Catheterization.    The left ventricular systolic function is normal. The left ventricular ejection fraction is greater than 65% by visual estimate.  LV end diastolic pressure is low.  ---------------------------------------  CULPRIT LESION: Mid LAD lesion is 85% stenosed.  A drug-eluting stent was successfully placed using a STENT RESOLUTE ONYX 2.5X15 -> postdilated 2.8 mm  Post intervention, there is a 0% residual stenosis.  ------------  Mid Cx lesion is 60% stenosed. Negative DFR  Prox RCA to Mid RCA lesion is 65% stenosed. Mid RCA lesion is 45% stenosed. Negative DFR (including the 45% mid RCA lesion.  2nd RPL lesion is 65% stenosed. (Small caliber vessel)  SUMMARY  Severe single-vessel disease with segmental 80% irregular lesion in the mid LAD between SP1 and D1 (as suggested by CT scan) ? Successful DES PCI of mid LAD reducing 80% to 0% using Resolute Onyx DES 2.5 mm x 15 mm postdilated to 2.8 mm.  Extensive moderate/nonobstructive by both CT FFR and DFR lesions in the proximal RCA (irregular 60%) and mid RCA (focal 45%) as well as proximal-mid LCx 60% eccentric lesion. ->DFR both vessels was roughly 0.97  Normal LVEF and normal EDP. No R WMA.   RECOMMENDATIONS:  Plan medical management  of RCA and LCx disease. Will need aggressive lipid management -->outpatient referral to [facility] Clinical Pharmacist run Lipid Clinic  Same-day discharge. Has follow-up set up with Oris Drone, NP next week.    Diagnostic Dominance: Right  Intervention     Assessment & Plan   1.  Coronary artery disease-continues with no chest pain.  Underwent cardiac catheterization with PCI and DES x1 to her mid LAD.  She was placed on DAPT.  Completed cardiac rehab in [place].  Continue to be physically active at home. Continue  aspirin, quinapril, fish oil, metoprolol, nitroglycerin, omega-3 Heart healthy low-sodium diet-salty 6 given Increase physical activity as tolerated  Hyperlipidemia-LDL 81 on 12/21/2019.  Statin intolerant.  Sent to pharmacy for lipid clinic evaluation.  Started on [employer], omega-3 Continue heart healthy low-sodium high-fiber diet Increase physical activity as tolerated Follows with lipid clinic  Essential hypertension-BP [phone].Well-controlled at home120s over 60s ContinueHyzaar, metoprolol Heart healthy low-sodium diet-salty 6 given  PVCs/PACs-no complaints of palpitations.  Previously titrated off beta-blocker and placed on diltiazem.  Continue diltiazem  Abdominal swelling-has gained 4 pounds over the past 4 days.  Has noted slight abdominal swelling. Order furosemide 20 mg x 3 days Order potassium 10 mEq x 3 days Order BMP  Disposition: Follow-up with Dr. Herbie Miller in 6 months.   Thomasene Ripple. Cleaver NP-C    01/12/2020, 11:11 AM [facility] [address] Office [phone] Fax [phone]  Notice: This dictation was prepared with Dragon dictation along with smaller phrase technology. Any transcriptional errors that result from this process are unintentional and may not be corrected upon review.

## 2020-01-12 ENCOUNTER — Encounter: Payer: Self-pay | Admitting: General Practice

## 2020-01-12 ENCOUNTER — Other Ambulatory Visit: Payer: Self-pay

## 2020-01-12 ENCOUNTER — Ambulatory Visit (INDEPENDENT_AMBULATORY_CARE_PROVIDER_SITE_OTHER): Payer: Medicare Other | Admitting: General Practice

## 2020-01-12 VITALS — BP 132/88 | HR 70 | Ht 61.0 in | Wt 191.2 lb

## 2020-01-12 DIAGNOSIS — I10 Essential (primary) hypertension: Secondary | ICD-10-CM

## 2020-01-12 DIAGNOSIS — I493 Ventricular premature depolarization: Secondary | ICD-10-CM

## 2020-01-12 DIAGNOSIS — I491 Atrial premature depolarization: Secondary | ICD-10-CM | POA: Diagnosis not present

## 2020-01-12 DIAGNOSIS — E785 Hyperlipidemia, unspecified: Secondary | ICD-10-CM

## 2020-01-12 DIAGNOSIS — I2510 Atherosclerotic heart disease of native coronary artery without angina pectoris: Secondary | ICD-10-CM

## 2020-01-12 DIAGNOSIS — Z79899 Other long term (current) drug therapy: Secondary | ICD-10-CM

## 2020-01-12 DIAGNOSIS — R600 Localized edema: Secondary | ICD-10-CM

## 2020-01-12 LAB — BASIC METABOLIC PANEL
BUN/Creatinine Ratio: 19 (ref 12–28)
BUN: 22 mg/dL (ref 8–27)
CO2: 24 mmol/L (ref 20–29)
Calcium: 9.8 mg/dL (ref 8.7–10.3)
Chloride: 100 mmol/L (ref 96–106)
Creatinine, Ser: 1.16 mg/dL — ABNORMAL HIGH (ref 0.57–1.00)
GFR calc Af Amer: 55 mL/min/{1.73_m2} — ABNORMAL LOW (ref >59)
GFR calc non Af Amer: 48 mL/min/{1.73_m2} — ABNORMAL LOW (ref >59)
Glucose: 100 mg/dL — ABNORMAL HIGH (ref 65–99)
Potassium: 4.2 mmol/L (ref 3.5–5.2)
Sodium: 139 mmol/L (ref 134–144)

## 2020-01-12 MED ORDER — POTASSIUM CHLORIDE ER 10 MEQ PO TBCR: 10.0000 meq | EXTENDED_RELEASE_TABLET | Freq: Every day | ORAL | 0 refills | Status: DC

## 2020-01-12 MED ORDER — FUROSEMIDE 20 MG PO TABS: 20.0000 mg | ORAL_TABLET | Freq: Every day | ORAL | 0 refills | Status: DC

## 2020-01-12 NOTE — Patient Instructions (Signed)
Medication Instructions:  TAKE FUROSEMIDE 20MG  x3DAYS THEN STOP  TAKE POTASSIUM x3DAYS THEN STOP *If you need a refill on your cardiac medications before your next appointment, please call your pharmacy*  Lab Work: BMET TODAY If you have labs (blood work) drawn today and your tests are completely normal, you will receive your results only by:  MyChart Message (if you have MyChart) OR A paper copy in the mail.  If you have any lab test that is abnormal or we need to change your treatment, we will call you to review the results. You may go to any [facility] that is convenient for you however, we do have a lab in our office that is able to assist you. You DO NOT need an appointment for our lab. The lab is open 8:00am and closes at 4:00pm. Lunch 12:45 - 1:45pm.  Special Instructions  PLEASE INCREASE PHYSICAL ACTIVITY AS TOLERATED  Follow-Up: Your next appointment:  6 month(s) In Person with , MD OR IF UNAVAILABLE JESSE CLEAVER, FNP-C   At [facility], you and your health needs are our priority.  As part of our continuing mission to provide you with exceptional heart care, we have created designated Provider Care Teams.  These Care Teams include your primary Cardiologist (physician) and Advanced Practice Providers (APPs -  Physician Assistants and Nurse Practitioners) who all work together to provide you with the care you need, when you need it.

## 2020-01-18 ENCOUNTER — Other Ambulatory Visit: Payer: Self-pay | Admitting: Cardiology

## 2020-03-08 NOTE — Telephone Encounter (Signed)
That is great.  I am glad that Praluent is finally doing the job.  Cathleen Corti, MD

## 2020-04-14 ENCOUNTER — Telehealth: Payer: Self-pay | Admitting: Pharmacist

## 2020-04-14 NOTE — Telephone Encounter (Signed)
Patient requests call back to discuss possible side effect to Praluent.  She is concern about increased fatigue, SOB and anxiety. She never developed symptoms with Praluent for > 2 months, but is concern about worsening shortness of breath.  Will HOLD next Praluent dose to re-assess potential adverse reaction. If SOB improves, we will try Repatha SureClick as an alternative.  After discussion of symptoms with patient, she will like a call back from Dr [name] note to determine if it will possible to get an appointment with him early instead of waiting until June for follow up.

## 2020-04-14 NOTE — Telephone Encounter (Signed)
Patient will like a call back to discuss increase SOB on exertion. She will experience increase SOB only walking inside the house. Denies chest pain, dizziness,or increased allergy symptoms.   She will like to talk to the nurse to determine if an early appointment with Dr Herbie Baltimore or APP is needed.

## 2020-04-15 NOTE — Telephone Encounter (Signed)
I do not think have seen her since her PCI.  My schedule has me in the hospital all next week.  I am then relatively booked the following week in clinic.  She could potentially be worked in that week, but if she is feeling poorly, would probably better for her to be seen next week (week of 3/14) by an APP.  Probably needs ischemic evaluation for an echocardiogram at a minimum.  However we do need to evaluate her symptoms.   Bryan Lemma, MD

## 2020-04-19 NOTE — Telephone Encounter (Signed)
Spoke with patient . Appointment schedule for 04/20/20 at 1:15 with Azalee Course PA.  Patient is aware voiced understanding

## 2020-04-20 ENCOUNTER — Ambulatory Visit (INDEPENDENT_AMBULATORY_CARE_PROVIDER_SITE_OTHER): Payer: Medicare Other | Admitting: Physician Assistant

## 2020-04-20 ENCOUNTER — Other Ambulatory Visit: Payer: Self-pay

## 2020-04-20 ENCOUNTER — Encounter: Payer: Self-pay | Admitting: Physician Assistant

## 2020-04-20 VITALS — BP 122/60 | HR 61 | Ht 61.0 in | Wt 195.0 lb

## 2020-04-20 DIAGNOSIS — I2510 Atherosclerotic heart disease of native coronary artery without angina pectoris: Secondary | ICD-10-CM

## 2020-04-20 DIAGNOSIS — F32A Depression, unspecified: Secondary | ICD-10-CM

## 2020-04-20 DIAGNOSIS — R0600 Dyspnea, unspecified: Secondary | ICD-10-CM | POA: Diagnosis not present

## 2020-04-20 DIAGNOSIS — E785 Hyperlipidemia, unspecified: Secondary | ICD-10-CM

## 2020-04-20 DIAGNOSIS — I10 Essential (primary) hypertension: Secondary | ICD-10-CM | POA: Diagnosis not present

## 2020-04-20 NOTE — Patient Instructions (Addendum)
Medication Instructions:  Your physician recommends that you continue on your current medications as directed. Please refer to the Current Medication list given to you today.  *If you need a refill on your cardiac medications before your next appointment, please call your pharmacy*  Lab Work: NONE ordered at this time of appointment   If you have labs (blood work) drawn today and your tests are completely normal, you will receive your results only by: Marland Kitchen MyChart Message (if you have MyChart) OR . A paper copy in the mail If you have any lab test that is abnormal or we need to change your treatment, we will call you to review the results.  Testing/Procedures: NONE ordered at this time of appointment   Follow-Up: At [facility], you and your health needs are our priority.  As part of our continuing mission to provide you with exceptional heart care, we have created designated Provider Care Teams.  These Care Teams include your primary Cardiologist (physician) and Advanced Practice Providers (APPs -  Physician Assistants and Nurse Practitioners) who all work together to provide you with the care you need, when you need it.    Your next appointment:   Follow up as scheduled    The format for your next appointment:   In Person  Provider:   Bryan Lemma, MD  Other Instructions  You have been referred to Psychiatry   CONTINUE to monitor Shortness of Breath especially with exertion.  Give office a call if symptoms are worse

## 2020-04-20 NOTE — Progress Notes (Signed)
Cardiology Office Note:    Date:  04/21/2020   ID:  Kiara Miller, DOB Jan 23, 1951, MRN [id number]  PCP:  Tracey Harries, MD   [facility]  Cardiologist:  Bryan Lemma, MD  Advanced Practice Provider:  No care team member to display Electrophysiologist:  None   Referring MD: Tracey Harries, MD   Chief Complaint  Patient presents with  . Follow-up  . Shortness of Breath  . Fatigue       . Chest Pain    Pressure.    History of Present Illness:    Kiara Miller is a 70 y.o. female with a hx of CAD, hypertension, hyperlipidemia, PACs and chronic fatigue.  Patient has been transitioned from beta-blocker to calcium channel blocker in the past due to fatigue.  Coronary CTA obtained in August 2021 showed 50 to 69% LAD lesion with FFR of 0.66.  Subsequent cardiac catheterization performed on 10/05/2019 revealed 85% mid LAD lesion treated with resolute Onyx 2.5 x 15 mm DES, 60% mid left circumflex lesion, 65% proximal to mid RCA lesion, 45% mid RCA lesion, 65% RPL 2 lesion.  Patient was last seen in December 2021 at which time she was doing well.  Patient presents to cardiology service for evaluation of dyspnea and chronic fatigue.  Fatigue has been going on for a long time and did not change even after stent placement last year.  Her dyspnea however improved after stent placement.  At this point, she has lost interest in doing majority of the things at home.  She is also tearful a lot of the times and feeling down.  She is not getting enough sleep and to tend to overeat.  Initially, she suspect her symptoms might be a side effect from the Praluent, our clinical pharmacist recommended she hold the dose to see if it makes any difference.  If it does not make any difference to her symptoms, I would prefer her to restart Praluent on the next dose.  Her symptom is unlikely related to Praluent in my opinion.  She does have a stationary bicycle at home and has no significant  shortness of breath while exercising on the stationary bicycle.  My suspicion that she has worsening coronary artery disease is fairly on the lower side.  I did offer a nuclear stress test, however she did not wish to pursue the nuclear stress test due to side effect with Lexiscan in the past.  I think will be very reasonable to continue to observe her symptoms.  Based on her presentation and symptoms, she is clearly severely depressed at home.  In the past, she did not wish to try the antidepressant, however she is willing to reconsider at this time.  I will refer her to psychiatry service.   Past Medical History:  Diagnosis Date  . Diverticulitis April 2017  . Hyperlipidemia   . Hypertension   . Joint pain   . Osteoarthritis   . Polyarthralgia   . Rheumatoid arthritis (HCC)   . Right knee pain     Past Surgical History:  Procedure Laterality Date  . ABDOMINAL HYSTERECTOMY    . CARPAL TUNNEL RELEASE    . CORONARY STENT INTERVENTION N/A 10/05/2019   Procedure: CORONARY STENT INTERVENTION;  Surgeon: Marykay Lex, MD;  Location: [facility] INVASIVE CV LAB;  Service: Cardiovascular;  Laterality: N/A;  . KNEE ARTHROPLASTY    . LEFT HEART CATH AND CORONARY ANGIOGRAPHY N/A 10/05/2019   Procedure: LEFT HEART CATH AND  CORONARY ANGIOGRAPHY;  Surgeon: Marykay Lex, MD;  Location: [facility] INVASIVE CV LAB;  Service: Cardiovascular;  Laterality: N/A;  . MANDIBLE SURGERY    . NM MYOVIEW LTD  06/2017   (Ordered for coronary calcium score 376) LOW RISK.  EF 72%.  No ischemia or infarction.  No wall motion normality.  No change from previous.  . OSTEOTOMY PELVIS BILATERAL    . TONSILLECTOMY    . TONSILLECTOMY    . TOTAL KNEE ARTHROPLASTY Right 09/02/2018   Procedure: TOTAL KNEE ARTHROPLASTY;  Surgeon: Durene Romans, MD;  Location: [facility];  Service: Orthopedics;  Laterality: Right;  70 mins  . TRANSTHORACIC ECHOCARDIOGRAM  12/2010   EF 65-70%. Mild LVH. No RWMA/ Gr1 DD.  . TUBAL LIGATION    . WRIST FUSION       Current Medications: Current Meds  Medication Sig  . Alirocumab (PRALUENT) 150 MG/ML SOAJ Inject 150 mg into the skin every 14 (fourteen) days.  Marland Kitchen aspirin EC 81 MG tablet Take 1 tablet (81 mg total) by mouth daily. Swallow whole.  . cephALEXin (KEFLEX) 500 MG capsule Take 2,000 mg by mouth See admin instructions. Take 2000 mg 1 hour prior to dental work  . clopidogrel (PLAVIX) 75 MG tablet Take 1 tablet (75 mg total) by mouth daily.  Marland Kitchen ibuprofen (ADVIL) 200 MG tablet Take 800 mg by mouth 2 (two) times daily as needed for headache or moderate pain.  Marland Kitchen ketoconazole (NIZORAL) 2 % cream Apply 1 application topically daily as needed for irritation.  Marland Kitchen levothyroxine (SYNTHROID, LEVOTHROID) 75 MCG tablet Take 75 mcg by mouth daily before breakfast.  . losartan-hydrochlorothiazide (HYZAAR) 100-25 MG per tablet Take 1 tablet by mouth daily.  . metoprolol succinate (TOPROL-XL) 25 MG 24 hr tablet Take 1 tablet (25 mg total) by mouth daily.  . montelukast (SINGULAIR) 10 MG tablet Take 10 mg by mouth at bedtime.  . Vitamin D, Ergocalciferol, (DRISDOL) 50000 UNITS CAPS capsule Take 50,000 Units by mouth every Wednesday.      Allergies:   Clindamycin/lincomycin, Codeine, Leflunomide, Penicillins, Statins, Tetracyclines & related, and Levofloxacin   Social History   Socioeconomic History  . Marital status: Married    Spouse name: Not on file  . Number of children: Not on file  . Years of education: Not on file  . Highest education level: Not on file  Occupational History  . Not on file  Tobacco Use  . Smoking status: Former Smoker    Packs/day: 0.50    Years: 48.00    Pack years: 24.00    Types: Cigarettes    Quit date: 08/18/2018    Years since quitting: 1.6  . Smokeless tobacco: Never Used  Vaping Use  . Vaping Use: Never used  Substance and Sexual Activity  . Alcohol use: No  . Drug use: No  . Sexual activity: Not on file  Other Topics Concern  . Not on file  Social History  Narrative  . Not on file   Social Determinants of Health   Financial Resource Strain: Not on file  Food Insecurity: Not on file  Transportation Needs: Not on file  Physical Activity: Not on file  Stress: Not on file  Social Connections: Not on file     Family History: The patient's family history includes Hypertension in her father; Lung disease in her brother.  ROS:   Please see the history of present illness.     All other systems reviewed and are negative.  EKGs/Labs/Other Studies  Reviewed:    The following studies were reviewed today:  Cath 10/05/2019   The left ventricular systolic function is normal. The left ventricular ejection fraction is greater than 65% by visual estimate.  LV end diastolic pressure is low.  ---------------------------------------  CULPRIT LESION: Mid LAD lesion is 85% stenosed.  A drug-eluting stent was successfully placed using a STENT RESOLUTE ONYX 2.5X15 -> postdilated 2.8 mm  Post intervention, there is a 0% residual stenosis.  ------------  Mid Cx lesion is 60% stenosed. Negative DFR  Prox RCA to Mid RCA lesion is 65% stenosed. Mid RCA lesion is 45% stenosed. Negative DFR (including the 45% mid RCA lesion.  2nd RPL lesion is 65% stenosed. (Small caliber vessel)   SUMMARY  Severe single-vessel disease with segmental 80% irregular lesion in the mid LAD between SP1 and D1 (as suggested by CT scan) ? Successful DES PCI of mid LAD reducing 80% to 0% using Resolute Onyx DES 2.5 mm x 15 mm postdilated to 2.8 mm.  Extensive moderate/nonobstructive by both CT FFR and DFR lesions in the proximal RCA (irregular 60%) and mid RCA (focal 45%) as well as proximal-mid LCx 60% eccentric lesion. ->  DFR both vessels was roughly 0.97  Normal LVEF and normal EDP.  No R WMA.   RECOMMENDATIONS:  Plan medical management of RCA and LCx disease.  Will need aggressive lipid management --> outpatient referral to [facility] Clinical Pharmacist run Lipid  Clinic  Same-day discharge.  Has follow-up set up with Oris Drone, NP next week.    EKG:  EKG is ordered today.  The ekg ordered today demonstrates normal sinus rhythm, poor R wave progression in the anterior leads.  Recent Labs: 10/01/2019: ALT 18; Hemoglobin 15.0; Platelets 163 01/12/2020: BUN 22; Creatinine, Ser 1.16; Potassium 4.2; Sodium 139  Recent Lipid Panel    Component Value Date/Time   CHOL 165 12/21/2019 0929   TRIG 199 (H) 12/21/2019 0929   HDL 51 12/21/2019 0929   CHOLHDL 3.2 12/21/2019 0929   LDLCALC 81 12/21/2019 0929     Risk Assessment/Calculations:       Physical Exam:    VS:  BP 122/60 (BP Location: Left Arm, Patient Position: Sitting, Cuff Size: Large)   Pulse 61   Ht  (1.549 m)   Wt 195 lb (88.5 kg)   BMI 36.84 kg/m     Wt Readings from Last 3 Encounters:  04/20/20 195 lb (88.5 kg)  01/12/20 191 lb 3.2 oz (86.7 kg)  12/08/19 189 lb (85.7 kg)     GEN:  Well nourished, well developed in no acute distress HEENT: Normal NECK: No JVD; No carotid bruits LYMPHATICS: No lymphadenopathy CARDIAC: RRR, no murmurs, rubs, gallops RESPIRATORY:  Clear to auscultation without rales, wheezing or rhonchi  ABDOMEN: Soft, non-tender, non-distended MUSCULOSKELETAL:  No edema; No deformity  SKIN: Warm and dry NEUROLOGIC:  Alert and oriented x 3 PSYCHIATRIC:  Normal affect   ASSESSMENT:    1. Depression, unspecified depression type   2. Coronary artery disease involving native coronary artery of native heart without angina pectoris   3. Dyspnea, unspecified type   4. Essential hypertension   5. Hyperlipidemia LDL goal <70    PLAN:    In order of problems listed above:  1. Depression: Her major concern today is intermittent dyspnea and fatigue.  She says she is feeling down majority of the time.  She has lost interest in doing all things.  She does not get good enough rest and  tend to overeat.  I think her symptom is consistent with major  depression.  She was very tearful during today's visit.  She has previously been very hesitant to start on any antidepressant, however she is changing her mind, I think she would benefit from seeing a psychiatrist.  I will make the referral  2. CAD: Previously received a DES to mid LAD in 2021.  Recent dyspnea does not typically occur when she is riding the stationary bicycle at home which make me think it is probably more related to depression rather than cardiac issue.  3. Dyspnea: Does not occur with physical activity.  She has a stationary bicycle and can exercise on it without any issue.  I initially consider a Myoview, however patient does not wish to proceed with Myoview as she had severe side effect with Lexiscan injection in the past.  Coronary CT would not be very helpful in this case given the previous stent placement.  Given lack of EKG changes, I think would be reasonable to continue observation for the time being.  4. Hypertension: Blood pressure well controlled  5. Hyperlipidemia: She previously attributed her symptoms to Praluent, I discussed the case with our clinical pharmacist, we are okay with her to hold a dose of Praluent to see if her symptoms will be improved.  Overall I do not think her symptom is truly related to Praluent.        Medication Adjustments/Labs and Tests Ordered: Current medicines are reviewed at length with the patient today.  Concerns regarding medicines are outlined above.  Orders Placed This Encounter  Procedures  . Ambulatory referral to Psychiatry  . EKG 12-Lead   No orders of the defined types were placed in this encounter.   Patient Instructions  Medication Instructions:  Your physician recommends that you continue on your current medications as directed. Please refer to the Current Medication list given to you today.  *If you need a refill on your cardiac medications before your next appointment, please call your pharmacy*  Lab Work: NONE  ordered at this time of appointment   If you have labs (blood work) drawn today and your tests are completely normal, you will receive your results only by: Marland Kitchen MyChart Message (if you have MyChart) OR . A paper copy in the mail If you have any lab test that is abnormal or we need to change your treatment, we will call you to review the results.  Testing/Procedures: NONE ordered at this time of appointment   Follow-Up: At [facility], you and your health needs are our priority.  As part of our continuing mission to provide you with exceptional heart care, we have created designated Provider Care Teams.  These Care Teams include your primary Cardiologist (physician) and Advanced Practice Providers (APPs -  Physician Assistants and Nurse Practitioners) who all work together to provide you with the care you need, when you need it.    Your next appointment:   Follow up as scheduled    The format for your next appointment:   In Person  Provider:   Bryan Lemma, MD  Other Instructions  You have been referred to Psychiatry   CONTINUE to monitor Shortness of Breath especially with exertion.  Give office a call if symptoms are worse      Signed, Azalee Course, [place]  04/21/2020 11:56 PM    [facility]

## 2020-05-24 NOTE — Progress Notes (Deleted)
Office Visit Note  Patient: Kiara Miller             Date of Birth: [birth year]           MRN: [id number]             PCP: Kiara Harries, MD Referring: Kiara Harries, MD Visit Date: 06/07/2020 Occupation: @GUAROCC @  Subjective:  No chief complaint on file.   History of Present Illness: Kiara Miller is a 70 y.o. female ***   Activities of Daily Living:  Patient reports morning stiffness for *** {minute/hour:19697}.   Patient {ACTIONS;DENIES/REPORTS:21021675::"Denies"} nocturnal pain.  Difficulty dressing/grooming: {ACTIONS;DENIES/REPORTS:21021675::"Denies"} Difficulty climbing stairs: {ACTIONS;DENIES/REPORTS:21021675::"Denies"} Difficulty getting out of chair: {ACTIONS;DENIES/REPORTS:21021675::"Denies"} Difficulty using hands for taps, buttons, cutlery, and/or writing: {ACTIONS;DENIES/REPORTS:21021675::"Denies"}  No Rheumatology ROS completed.   PMFS History:  Patient Active Problem List   Diagnosis Date Noted  . Hyperlipidemia LDL goal <70 10/27/2019  . Progressive angina (HCC) 10/05/2019  . Abnormal cardiac CT angiography 10/05/2019  . Feeling of chest tightness 09/04/2019  . Chronic fatigue 04/09/2019  . Obese 09/03/2018  . 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 PAC (premature atrial contraction) 07/11/2017  . History of hypothyroidism 06/11/2017  . Essential hypertension 06/11/2017  . History of obesity 06/11/2017  . S/P right knee arthroscopy 06/11/2017  . DDD (degenerative disc disease), cervical 06/11/2017  . DDD (degenerative disc disease), lumbar 06/11/2017  . History of carpal tunnel surgery of right wrist 06/11/2017  . Primary osteoarthritis of both knees 06/11/2017  . Chest pain, precordial 05/05/2017  . PVC's (premature ventricular contractions) 05/03/2017  . 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 Uveitis 10/10/2015  . Diverticulitis 10/10/2015   . Dyspnea 12/05/2010  . Palpitations 12/05/2010    Past Medical History:  Diagnosis Date  . Diverticulitis April 2017  . Hyperlipidemia   . Hypertension   . Joint pain   . Osteoarthritis   . Polyarthralgia   . Rheumatoid arthritis (HCC)   . Right knee pain     Family History  Problem Relation Age of Onset  . Hypertension Father   . Lung disease Brother    Past Surgical History:  Procedure Laterality Date  . ABDOMINAL HYSTERECTOMY    . CARPAL TUNNEL RELEASE    . CORONARY STENT INTERVENTION N/A 10/05/2019   Procedure: CORONARY STENT INTERVENTION;  Surgeon: 10/07/2019, MD;  Location: [facility] INVASIVE CV LAB;  Service: Cardiovascular;  Laterality: N/A;  . KNEE ARTHROPLASTY    . LEFT HEART CATH AND CORONARY ANGIOGRAPHY N/A 10/05/2019   Procedure: LEFT HEART CATH AND CORONARY ANGIOGRAPHY;  Surgeon: 10/07/2019, MD;  Location: [facility] INVASIVE CV LAB;  Service: Cardiovascular;  Laterality: N/A;  . MANDIBLE SURGERY    . NM MYOVIEW LTD  06/2017   (Ordered for coronary calcium score 376) LOW RISK.  EF 72%.  No ischemia or infarction.  No wall motion normality.  No change from previous.  . OSTEOTOMY PELVIS BILATERAL    . TONSILLECTOMY    . TONSILLECTOMY    . TOTAL KNEE ARTHROPLASTY Right 09/02/2018   Procedure: TOTAL KNEE ARTHROPLASTY;  Surgeon: 09/04/2018, MD;  Location: [facility];  Service: Orthopedics;  Laterality: Right;  70 mins  . TRANSTHORACIC ECHOCARDIOGRAM  12/2010   EF 65-70%. Mild LVH. No RWMA/ Gr1 DD.  . TUBAL LIGATION    . WRIST FUSION     Social History   Social History  Narrative  . Not on file   Immunization History  Administered Date(s) Administered  . PFIZER(Purple Top)SARS-COV-2 Vaccination 02/25/2019, 03/19/2019  . Pneumococcal Polysaccharide-23 07/07/2012     Objective: Vital Signs: There were no vitals taken for this visit.   Physical Exam   Musculoskeletal Exam: ***  CDAI Exam: CDAI Score: -- Patient Global: --; Provider Global: -- Swollen: --;  Tender: -- Joint Exam 06/07/2020   No joint exam has been documented for this visit   There is currently no information documented on the homunculus. Go to the Rheumatology activity and complete the homunculus joint exam.  Investigation: No additional findings.  Imaging: No results found.  Recent Labs: Lab Results  Component Value Date   WBC 6.0 10/01/2019   HGB 15.0 10/01/2019   PLT 163 10/01/2019   NA 139 01/12/2020   K 4.2 01/12/2020   CL 100 01/12/2020   CO2 24 01/12/2020   GLUCOSE 100 (H) 01/12/2020   BUN 22 01/12/2020   CREATININE 1.16 (H) 01/12/2020   BILITOT 0.7 10/01/2019   ALKPHOS 49 10/01/2019   AST 15 10/01/2019   ALT 18 10/01/2019   PROT 6.8 10/01/2019   ALBUMIN 4.4 10/01/2019   CALCIUM 9.8 01/12/2020   GFRAA 55 (L) 01/12/2020   QFTBGOLD Negative 08/30/2016    Speciality Comments: Orencia IV 750mg  every 4 weeks, TB gold negative 08/30/16  PLQ Eye Exam: 03/09/2019 WNL @ [facility] Follow up in 6 months.  Procedures:  No procedures performed Allergies: Clindamycin/lincomycin, Codeine, Leflunomide, Penicillins, Statins, Tetracyclines & related, and Levofloxacin   Assessment / Plan:     Visit Diagnoses: No diagnosis found.  Orders: No orders of the defined types were placed in this encounter.  No orders of the defined types were placed in this encounter.   Face-to-face time spent with patient was *** minutes. Greater than 50% of time was spent in counseling and coordination of care.  Follow-Up Instructions: No follow-ups on file.   05/07/2019, CMA  Note - This record has been created using Ellen Henri.  Chart creation errors have been sought, but may not always  have been located. Such creation errors do not reflect on  the standard of medical care.

## 2020-06-07 ENCOUNTER — Ambulatory Visit: Payer: Medicare Other | Admitting: Rheumatology

## 2020-06-07 DIAGNOSIS — Z8639 Personal history of other endocrine, nutritional and metabolic disease: Secondary | ICD-10-CM

## 2020-06-07 DIAGNOSIS — Z9889 Other specified postprocedural states: Secondary | ICD-10-CM

## 2020-06-07 DIAGNOSIS — Z8679 Personal history of other diseases of the circulatory system: Secondary | ICD-10-CM

## 2020-06-07 DIAGNOSIS — M1712 Unilateral primary osteoarthritis, left knee: Secondary | ICD-10-CM

## 2020-06-07 DIAGNOSIS — M0609 Rheumatoid arthritis without rheumatoid factor, multiple sites: Secondary | ICD-10-CM

## 2020-06-07 DIAGNOSIS — Z981 Arthrodesis status: Secondary | ICD-10-CM

## 2020-06-07 DIAGNOSIS — Z96651 Presence of right artificial knee joint: Secondary | ICD-10-CM

## 2020-06-07 DIAGNOSIS — M533 Sacrococcygeal disorders, not elsewhere classified: Secondary | ICD-10-CM

## 2020-06-07 DIAGNOSIS — R931 Abnormal findings on diagnostic imaging of heart and coronary circulation: Secondary | ICD-10-CM

## 2020-06-07 DIAGNOSIS — M5030 Other cervical disc degeneration, unspecified cervical region: Secondary | ICD-10-CM

## 2020-06-07 DIAGNOSIS — Z8719 Personal history of other diseases of the digestive system: Secondary | ICD-10-CM

## 2020-06-07 DIAGNOSIS — Z79899 Other long term (current) drug therapy: Secondary | ICD-10-CM

## 2020-06-07 DIAGNOSIS — M5136 Other intervertebral disc degeneration, lumbar region: Secondary | ICD-10-CM

## 2020-07-15 ENCOUNTER — Other Ambulatory Visit: Payer: Self-pay

## 2020-07-18 ENCOUNTER — Other Ambulatory Visit: Payer: Self-pay

## 2020-07-18 ENCOUNTER — Encounter: Payer: Self-pay | Admitting: Cardiology

## 2020-07-18 ENCOUNTER — Ambulatory Visit (INDEPENDENT_AMBULATORY_CARE_PROVIDER_SITE_OTHER): Payer: Medicare Other | Admitting: Cardiology

## 2020-07-18 VITALS — BP 150/72 | HR 55 | Ht 61.5 in | Wt 191.6 lb

## 2020-07-18 DIAGNOSIS — Z9861 Coronary angioplasty status: Secondary | ICD-10-CM | POA: Diagnosis not present

## 2020-07-18 DIAGNOSIS — R002 Palpitations: Secondary | ICD-10-CM | POA: Diagnosis not present

## 2020-07-18 DIAGNOSIS — I10 Essential (primary) hypertension: Secondary | ICD-10-CM

## 2020-07-18 DIAGNOSIS — E785 Hyperlipidemia, unspecified: Secondary | ICD-10-CM | POA: Diagnosis not present

## 2020-07-18 DIAGNOSIS — I2510 Atherosclerotic heart disease of native coronary artery without angina pectoris: Secondary | ICD-10-CM | POA: Diagnosis not present

## 2020-07-18 MED ORDER — CLOPIDOGREL BISULFATE 75 MG PO TABS
75.0000 mg | ORAL_TABLET | Freq: Every day | ORAL | 3 refills | Status: DC
Start: 2020-07-18 — End: 2021-08-03

## 2020-07-18 NOTE — Assessment & Plan Note (Signed)
Well-controlled on very low-dose beta-blocker.  Was not able to tolerate 25 mg because of fatigue and chronotropic incompetence.  When I mention potentially stopping it altogether, she indicated that now she needs to be on it because the palpitations are too bad otherwise.

## 2020-07-18 NOTE — Progress Notes (Signed)
Primary Care Provider: Bernerd Limbo, MD Cardiologist: Glenetta Hew, MD Electrophysiologist: None  Clinic Note: Chief Complaint  Patient presents with   Follow-up    3 months.  Depression doing much better on Lexapro.   Coronary Artery Disease    No more chest discomfort, and much improved dyspnea since PCI.  Still has fatigue.   Palpitations    Well-controlled on low-dose Toprol, but not able to tolerate being off of it.     ===================================  ASSESSMENT/PLAN   Problem List Items Addressed This Visit     Essential hypertension (Chronic)    Blood pressure usually controlled.  She just took her medicines this morning was running late getting in.  She says for the most part her systolic pressures are usually more consistent with the 120 to 130 mmHg range at home.  Continue current dose of Hyzaar and low-dose Toprol.       Relevant Medications   metoprolol succinate (TOPROL-XL) 25 MG 24 hr tablet   Other Relevant Orders   CBC   Palpitations (Chronic)    Well-controlled on very low-dose beta-blocker.  Was not able to tolerate 25 mg because of fatigue and chronotropic incompetence.  When I mention potentially stopping it altogether, she indicated that now she needs to be on it because the palpitations are too bad otherwise.       Relevant Orders   CBC   Hyperlipidemia LDL goal <70 - Primary (Chronic)    Most recent lipids from January show LDL is now down to 69.  In November LDL was 81.  She is now stable on Praluent.  Did not really have any change in her depression symptoms with holding Praluent.  We will recheck lipids today to ensure that she is stable.       Relevant Medications   metoprolol succinate (TOPROL-XL) 25 MG 24 hr tablet   Other Relevant Orders   Hepatic function panel   Lipid panel   CBC   CAD S/P percutaneous coronary angioplasty (Chronic)    No further anginal symptoms.  He is relatively active.  She does have existing  moderate disease in the circumflex and RCA being treated medically.  Plan: Continue current dose of Toprol 12.5 mg daily (this is more for palpitations) Continue Hyzaar at current dose. On Praluent with lipids due to be checked. 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 Okay to hold Plavix 5 days preop for surgery or procedures.  7 days for high risk/neurologic/spinal procedures.       Relevant Medications   metoprolol succinate (TOPROL-XL) 25 MG 24 hr tablet   Other Relevant Orders   Hepatic function panel   Lipid panel   CBC    ===================================  HPI:    Kiara Miller is a 70 y.o. female with a PMH notable for CAD (LAD PCI in August 2021), HTN, HLD, PACs and chronic fatigue who presents today for 7-monthfollow-up.  Unfortunate, I have not seen her since her catheterization.  [place] PAIZwas last seen on April 20, 2020 by HAlmyra Deforest PA for evaluation of dyspnea.  Fatigue since a long standing, did not do so change after stent placement.  Dyspnea did improve.  Was quite down and depressed.  Showing signs of anhedonia.  Not getting enough sleep.  She was feeling that this may be related to Praluent-was told to do a brief holiday.  Felt to be depressed.  Refer to psychiatry. -> Exercise on  stationary bike with no issues.  Recent Hospitalizations: None  Reviewed  CV studies:    The following studies were reviewed today: (if available, images/films reviewed: From Epic Chart or Care Everywhere) Coronary CTA obtained in August 2021: Coronary calcium score 537. 50 to 69% LAD lesion with FFR of 0.66.  Nondominant LCx with 1 large OM estimated 25 and 49%.  RCA proximal mid estimated 50 to 69%.  CT FFR negative RCA and LCx (0.93). Cardiac Cath-PCI 10/05/2019: SUMMARY Severe single-vessel disease with segmental 80% irregular lesion in the mid LAD between SP1 and D1 (as suggested by CT scan) Successful  DES PCI of mid LAD reducing 80% to 0% using Resolute Onyx DES 2.5 mm x 15 mm postdilated to 2.8 mm. Extensive moderate/nonobstructive by both CT FFR and DFR lesions in the proximal RCA (irregular 60%) and mid RCA (focal 45%) as well as proximal-mid LCx 60% eccentric lesion. ->  DFR both vessels was roughly 0.97 Normal LVEF and normal EDP.  No R WMA. Diagnostic        Intervention    Interval History:   ALLIAH BOULANGER returns at  CV Review of Symptoms (Summary) Cardiovascular ROS: positive for - dyspnea on exertion and mostly because of deconditioning but also that she just has a hard time getting her heart rate up very fast.  Little fatigue.  Mild edema at the end of the day. negative for - chest pain, irregular heartbeat, orthopnea, palpitations, paroxysmal nocturnal dyspnea, rapid heart rate, shortness of breath, or lightheadedness or dizziness or wooziness.  Syncope/near syncope or TIA/amaurosis fugax, claudication  REVIEWED OF SYSTEMS   Review of Systems  Constitutional:  Positive for malaise/fatigue (A little better since has been on Lexapro.  Just has a little hard time getting her heart rate above 100.  However reluctant to stop beta-blocker.). Negative for weight loss.  HENT:  Negative for congestion and nosebleeds.   Respiratory:  Negative for shortness of breath.   Cardiovascular:  Positive for palpitations (Very well controlled). Negative for leg swelling.  Gastrointestinal:  Positive for abdominal pain (Abdominal bloating and discomfort associated with diverticulitis.  Is finally hoping to get into see GI later this month-has been 3 months.Marland Kitchen) and diarrhea. Negative for blood in stool, constipation, heartburn and melena.  Genitourinary:  Negative for hematuria.  Neurological:  Negative for dizziness and focal weakness.  Endo/Heme/Allergies:  Bruises/bleeds easily.  Psychiatric/Behavioral:  Positive for depression (Much better on Lexapro-still would like to see counseling.).  Negative for memory loss. The patient is not nervous/anxious and does not have insomnia.    I have reviewed and (if needed) personally updated the patient's problem list, medications, allergies, past medical and surgical history, social and family history.   PAST MEDICAL HISTORY   Past Medical History:  Diagnosis Date   CAD S/P percutaneous coronary angioplasty 10/05/2019   Cardiac cath revealed 85% mid LAD between SP1 and D1 (DES PCI with resolute Onyx DES 2.5 mm x 15 mm-negative for 8 mm); extensive moderate CT FFR and DFR negative proximal to mid RCA 60% and mid focal 45% as well as proximal to mid LCx 60% eccentric.  Both DFR was 0.97   Diverticulitis 05/2015   Hyperlipidemia    Hypertension    Joint pain    Osteoarthritis    Polyarthralgia    Rheumatoid arthritis ([place])    Right knee pain     PAST SURGICAL HISTORY   Past Surgical History:  Procedure Laterality Date   ABDOMINAL HYSTERECTOMY  CARPAL TUNNEL RELEASE     CORONARY STENT INTERVENTION N/A 10/05/2019   Procedure: CORONARY STENT INTERVENTION;  Surgeon: Leonie Man, MD;  Location: [facility];  Service: Cardiovascular;  Laterality: N/A;; 85% mid LAD irregular lesion.  DES PCI reducing to 0% with resolute Onyx DES 2.5 mm x15 mm--2.8 mm.   KNEE ARTHROPLASTY     LEFT HEART CATH AND CORONARY ANGIOGRAPHY N/A 10/05/2019   Procedure: LEFT HEART CATH AND CORONARY ANGIOGRAPHY;  Surgeon: Leonie Man, MD;  Location: [facility];  Service: Cardiovascular;: Mid LAD irregular 85% (DES PCI).  Extensive 60 to 65% proximal to mid RCA followed by 45% mid-distal RCA--DFR negative (0.97).  Proximal-mid LCx 60% eccentric lesion-DFR 0.97.  (Positive for DFR is<0.9)   MANDIBLE SURGERY     NM MYOVIEW LTD  06/2017   (Ordered for coronary calcium score 376) LOW RISK.  EF 72%.  No ischemia or infarction.  No wall motion normality.  No change from previous.   OSTEOTOMY PELVIS BILATERAL     TONSILLECTOMY     TONSILLECTOMY      TOTAL KNEE ARTHROPLASTY Right 09/02/2018   Procedure: TOTAL KNEE ARTHROPLASTY;  Surgeon: Paralee Cancel, MD;  Location: [facility];  Service: Orthopedics;  Laterality: Right;  70 mins   TRANSTHORACIC ECHOCARDIOGRAM  12/2010   EF 65-70%. Mild LVH. No RWMA/ Gr1 DD.   TUBAL LIGATION     WRIST FUSION      Immunization History  Administered Date(s) Administered   PFIZER(Purple Top)SARS-COV-2 Vaccination 02/25/2019, 03/19/2019   Pneumococcal Polysaccharide-23 07/07/2012    MEDICATIONS/ALLERGIES   Current Meds  Medication Sig   Alirocumab (PRALUENT) 150 MG/ML SOAJ Inject 150 mg into the skin every 14 (fourteen) days.   cephALEXin (KEFLEX) 500 MG capsule Take 2,000 mg by mouth See admin instructions. Take 2000 mg 1 hour prior to dental work   Cholecalciferol (VITAMIN D3) 1.25 MG (50000 UT) CAPS 1 capsule   ibuprofen (ADVIL) 200 MG tablet Take 800 mg by mouth 2 (two) times daily as needed for headache or moderate pain.   ketoconazole (NIZORAL) 2 % cream Apply 1 application topically daily as needed for irritation.   levothyroxine (SYNTHROID, LEVOTHROID) 75 MCG tablet Take 75 mcg by mouth daily before breakfast.   losartan-hydrochlorothiazide (HYZAAR) 100-25 MG per tablet Take 1 tablet by mouth daily.   metoprolol succinate (TOPROL-XL) 25 MG 24 hr tablet Take 12.5 mg by mouth daily.   montelukast (SINGULAIR) 10 MG tablet Take 10 mg by mouth at bedtime.   [DISCONTINUED] aspirin EC 81 MG tablet Take 1 tablet (81 mg total) by mouth daily. Swallow whole.   [DISCONTINUED] clopidogrel (PLAVIX) 75 MG tablet Take 1 tablet (75 mg total) by mouth daily.   [DISCONTINUED] metoprolol succinate (TOPROL-XL) 25 MG 24 hr tablet Take 1 tablet (25 mg total) by mouth daily.   [DISCONTINUED] Vitamin D, Ergocalciferol, (DRISDOL) 50000 UNITS CAPS capsule Take 50,000 Units by mouth every Wednesday.     Allergies  Allergen Reactions   Clindamycin/Lincomycin Other (See Comments)    Severe esophagitis   Codeine  Itching   Leflunomide Nausea Only   Penicillins Itching    Did it involve swelling of the face/tongue/throat, SOB, or low BP? No Did it involve sudden or severe rash/hives, skin peeling, or any reaction on the inside of your mouth or nose? No Did you need to seek medical attention at a hospital or doctor's office? No When did it last happen?      2010  If all above answers are "NO", may proceed with cephalosporin use.    Statins     Muscle weakness   Tetracyclines & Related Nausea And Vomiting   Levofloxacin Nausea Only and Palpitations    SOCIAL HISTORY/FAMILY HISTORY   Reviewed in Epic:  Pertinent findings:  Social History   Tobacco Use   Smoking status: Former    Packs/day: 0.50    Years: 48.00    Pack years: 24.00    Types: Cigarettes    Quit date: 08/18/2018    Years since quitting: 1.9   Smokeless tobacco: Never  Vaping Use   Vaping Use: Never used  Substance Use Topics   Alcohol use: No   Drug use: No   Social History   Social History Narrative   Not on file    OBJCTIVE -PE, EKG, labs   Wt Readings from Last 3 Encounters:  07/18/20 191 lb 9.6 oz (86.9 kg)  04/20/20 195 lb (88.5 kg)  01/12/20 191 lb 3.2 oz (86.7 kg)    Physical Exam: BP (!) 150/72   Pulse (!) 55   Ht 5' 1.5" (1.562 m)   Wt 191 lb 9.6 oz (86.9 kg)   SpO2 98%   BMI 35.62 kg/m  Physical Exam Constitutional:      General: She is not in acute distress.    Appearance: Normal appearance. She is obese. She is not ill-appearing or toxic-appearing.  HENT:     Head: Normocephalic and atraumatic.  Neck:     Vascular: No carotid bruit or JVD.  Cardiovascular:     Rate and Rhythm: Normal rate and regular rhythm. No extrasystoles are present.    Chest Wall: PMI is not displaced (Difficult to palpate).     Pulses: Normal pulses.     Heart sounds: S1 normal and S2 normal. Heart sounds are distant. No murmur heard.   No friction rub. No gallop.  Pulmonary:     Effort: Pulmonary effort is  normal. No respiratory distress.     Breath sounds: Normal breath sounds.  Chest:     Chest wall: No tenderness.  Musculoskeletal:        General: Swelling (Trivial) present. Normal range of motion.     Cervical back: Normal range of motion and neck supple.  Skin:    General: Skin is warm and dry.  Neurological:     General: No focal deficit present.     Mental Status: She is alert and oriented to person, place, and time.  Psychiatric:        Behavior: Behavior normal.        Thought Content: Thought content normal.        Judgment: Judgment normal.     Comments: Mood seems to be much better compared to her visit with Almyra Deforest, PA.  Still little bit down, but much improved.  Smiling.     Adult ECG Report Not checked  Recent Labs:  Component 03/02/20  08/19/19  06/24/18  Cholesterol, Total 140 258 High  250 High   Triglycerides 141 219 High  129  HDL 46 60 54  VLDL Cholesterol Cal 25 40 26  LDL 69 158 High  170 High   Comprehensive Metabolic Panel Component 35/45/62  08/19/19  06/24/18  Glucose 88 95 82  BUN 18 28 High  26  Creatinine 0.91 0.94 0.83  eGFR If NonAfrican American 64 62 73  eGFR If African American 74  72  84  BUN/Creatinine Ratio 20 30  High  31 High   Sodium 138 138 139  Potassium 4.1 4.0 3.9  Chloride 100 100 101  CO2 '25 24 23  ' CALCIUM 9.4 9.7 9.6  Total Protein 6.4 7.0 6.9  Albumin, Serum 4.2 4.4 4.5  Globulin, Total 2.2 2.6 2.4  Albumin/Globulin Ratio 1.9 1.7 1.9  Total Bilirubin 0.5 0.6 0.6  Alkaline Phosphatase 54 47 Low  49  AST '22 13 22  ' ALT (SGPT) '17 19 19    ' Lab Results  Component Value Date   CHOL 165 12/21/2019   HDL 51 12/21/2019   LDLCALC 81 12/21/2019   TRIG 199 (H) 12/21/2019   CHOLHDL 3.2 12/21/2019   Lab Results  Component Value Date   CREATININE 1.16 (H) 01/12/2020   BUN 22 01/12/2020   NA 139 01/12/2020   K 4.2 01/12/2020   CL 100 01/12/2020   CO2 24 01/12/2020   CBC Latest Ref Rng & Units 10/01/2019 06/29/2019  05/26/2019  WBC 3.4 - 10.8 x10E3/uL 6.0 3.8 5.2  Hemoglobin 11.1 - 15.9 g/dL 15.0 13.9 14.8  Hematocrit 34.0 - 46.6 % 42.8 41.9 44.9  Platelets 150 - 450 x10E3/uL 163 148 179    No results found for: TSH  ==================================================  COVID-19 Education: The signs and symptoms of COVID-19 were discussed with the patient and how to seek care for testing (follow up with PCP or arrange E-visit).    I spent a total of 10mnutes with the patient spent in direct patient consultation.  Additional time spent with chart review  / charting (studies, outside notes, etc): 8 min Total Time: 58 min  Current medicines are reviewed at length with the patient today.  (+/- concerns) none  This visit occurred during the SARS-CoV-2 public health emergency.  Safety protocols were in place, including screening questions prior to the visit, additional usage of staff PPE, and extensive cleaning of exam room while observing appropriate contact time as indicated for disinfecting solutions.  Notice: This dictation was prepared with Dragon dictation along with smaller phrase technology. Any transcriptional errors that result from this process are unintentional and may not be corrected upon review.  Patient Instructions / Medication Changes & Studies & Tests Ordered   Patient Instructions  Medication Instructions:  Stop aspirin  81 mg  Correction made for Toprol xl 12.5 mg  *If you need a refill on your cardiac medications before your next appointment, please call your pharmacy*   Lab Work: CBC LIPID HEPATIC FASTING  If you have labs (blood work) drawn today and your tests are completely normal, you will receive your results only by: MyChart Message (if you have MyChart) OR A paper copy in the mail If you have any lab test that is abnormal or we need to change your treatment, we will call you to review the results.   Testing/Procedures: Not needed   Follow-Up: [facility] and [facility] our [facility].  As part of our continuing mission to provide you with exceptional heart care, we have created designated Provider Care Teams.  These Care Teams include your primary Cardiologist (physician) and Advanced Practice Providers (APPs -  Physician Assistants and Nurse Practitioners) who all work together to provide you with the care you need, when you need it.     Your next appointment:   6 month(s)  The format for your next appointment:   In Person  Provider:   DGlenetta Hew MD     Studies Ordered:   Orders Placed  This Encounter  Procedures   Hepatic function panel   Lipid panel   CBC     Glenetta Hew, M.D., M.S. Interventional Cardiologist   Pager # [phone] Phone # [phone] [address] [place], [place] [address]   Thank you for choosing Heartcare at [facility]!!

## 2020-07-18 NOTE — Assessment & Plan Note (Signed)
Blood pressure usually controlled.  She just took her medicines this morning was running late getting in.  She says for the most part her systolic pressures are usually more consistent with the 120 to 130 mmHg range at home.  Continue current dose of Hyzaar and low-dose Toprol.

## 2020-07-18 NOTE — Patient Instructions (Signed)
Medication Instructions:  Stop aspirin  81 mg  Correction made for Toprol xl 12.5 mg  *If you need a refill on your cardiac medications before your next appointment, please call your pharmacy*   Lab Work: CBC LIPID HEPATIC FASTING  If you have labs (blood work) drawn today and your tests are completely normal, you will receive your results only by: MyChart Message (if you have MyChart) OR A paper copy in the mail If you have any lab test that is abnormal or we need to change your treatment, we will call you to review the results.   Testing/Procedures: Not needed   Follow-Up: At [facility], you and your health needs are our priority.  As part of our continuing mission to provide you with exceptional heart care, we have created designated Provider Care Teams.  These Care Teams include your primary Cardiologist (physician) and Advanced Practice Providers (APPs -  Physician Assistants and Nurse Practitioners) who all work together to provide you with the care you need, when you need it.     Your next appointment:   6 month(s)  The format for your next appointment:   In Person  Provider:   Bryan Lemma, MD

## 2020-07-18 NOTE — Assessment & Plan Note (Signed)
No further anginal symptoms.  He is relatively active.  She does have existing moderate disease in the circumflex and RCA being treated medically.  Plan:  Continue current dose of Toprol 12.5 mg daily (this is more for palpitations)  Continue Hyzaar at current dose.  On Praluent with lipids due to be checked.  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  Okay to hold Plavix 5 days preop for surgery or procedures.  7 days for high risk/neurologic/spinal procedures.

## 2020-07-18 NOTE — Assessment & Plan Note (Signed)
Her chronic fatigue seems to be stable, but the most recent episode which seem very consistent with depression seems to be doing much better now that she is on Lexapro.  She still does have some sense of sadness on occasion, no more of the episodes of feeling impending doom.

## 2020-07-18 NOTE — Assessment & Plan Note (Signed)
Most recent lipids from January show LDL is now down to 69.  In November LDL was 81.  She is now stable on Praluent.  Did not really have any change in her depression symptoms with holding Praluent.  We will recheck lipids today to ensure that she is stable.

## 2020-07-22 ENCOUNTER — Telehealth: Payer: Self-pay | Admitting: *Deleted

## 2020-07-22 LAB — HEPATIC FUNCTION PANEL
ALT: 16 IU/L (ref 0–32)
AST: 17 IU/L (ref 0–40)
Albumin: 4.5 g/dL (ref 3.8–4.8)
Alkaline Phosphatase: 62 IU/L (ref 44–121)
Bilirubin Total: <0.2 mg/dL (ref 0.0–1.2)
Bilirubin, Direct: <0.1 mg/dL (ref 0.00–0.40)
Total Protein: 7.1 g/dL (ref 6.0–8.5)

## 2020-07-22 LAB — CBC
Hematocrit: 42.7 % (ref 34.0–46.6)
Hemoglobin: 14.3 g/dL (ref 11.1–15.9)
MCH: 29.9 pg (ref 26.6–33.0)
MCHC: 33.5 g/dL (ref 31.5–35.7)
MCV: 89 fL (ref 79–97)
Platelets: 190 10*3/uL (ref 150–450)
RBC: 4.78 x10E6/uL (ref 3.77–5.28)
RDW: 14.1 % (ref 11.7–15.4)
WBC: 6 10*3/uL (ref 3.4–10.8)

## 2020-07-22 LAB — LIPID PANEL
Chol/HDL Ratio: 2.8 ratio (ref 0.0–4.4)
Cholesterol, Total: 150 mg/dL (ref 100–199)
HDL: 54 mg/dL (ref >39)
LDL Chol Calc (NIH): 73 mg/dL (ref 0–99)
Triglycerides: 130 mg/dL (ref 0–149)
VLDL Cholesterol Cal: 23 mg/dL (ref 5–40)

## 2020-07-22 NOTE — Telephone Encounter (Signed)
   [facility] Pre-operative Risk Assessment    Patient Name: Kiara Miller  DOB: 1950-10-26  MRN: [id number]   Request for surgical clearance:  What type of surgery is being performed? colonoscopy   When is this surgery scheduled? 12/05/20   What type of clearance is required (medical clearance vs. Pharmacy clearance to hold med vs. Both)? both  Are there any medications that need to be held prior to surgery and how long? Plavix    Practice name and name of physician performing surgery? [facility] Dr. Michail Sermon   What is the office phone number? [phone]   7.   What is the office fax number? [phone]  8.   Anesthesia type (None, local, MAC, general) ? propofol   Hunt Zajicek A Genessis Flanary 07/22/2020, 4:51 PM  _________________________________________________________________   (provider comments below)

## 2020-07-25 NOTE — Telephone Encounter (Signed)
Patient was seen last week by Dr. Herbie Baltimore on 07/18/2020 at which time she was doing well from a cardiac standpoint. She noted some dyspnea on exertion but this was felt to mostly be due to deconditioning. No chest pain. Per Dr. Elissa Hefty note, OK to hold Plavix for 5 days before any procedures. Colonoscopy is not scheduled on 12/05/2020. Therefore, she will need a call closer to the time of procedure to make sure she has not had any concerning changes since most recent visit.  Corrin Parker, PA-C 07/25/2020 7:51 AM

## 2020-08-04 ENCOUNTER — Other Ambulatory Visit: Payer: Self-pay

## 2020-08-04 ENCOUNTER — Ambulatory Visit
Admission: RE | Admit: 2020-08-04 | Discharge: 2020-08-04 | Disposition: A | Discharge status: Home / Self Care | Payer: Medicare Other | Source: Ambulatory Visit | Attending: Physician Assistant | Admitting: Physician Assistant

## 2020-08-04 ENCOUNTER — Other Ambulatory Visit: Payer: Self-pay | Admitting: Physician Assistant

## 2020-08-04 DIAGNOSIS — K5732 Diverticulitis of large intestine without perforation or abscess without bleeding: Secondary | ICD-10-CM

## 2020-08-04 IMAGING — CT CT ABD-PELV W/ CM
1 series · 14 of 32 positions shown, 18 images · IV contrast (APPLIED)
Comparison: CT Abdomen and Pelvis 04/29/2020 and 05/12/2015.

CLINICAL DATA: 69-year-old female with lower pelvic pain radiating
from left right. History of tubal ligation, diverticulitis.

Creatinine was obtained on site at [HOSPITAL] at [HOSPITAL].
Results: Creatinine 1.1 mg/dL.
EXAM:
CT ABDOMEN AND PELVIS WITH CONTRAST
TECHNIQUE: Multidetector CT imaging of the abdomen and pelvis was performed
using the standard protocol following bolus administration of
intravenous contrast.
CONTRAST:  100mL 2XH8H1-BMM IOPAMIDOL (2XH8H1-BMM) INJECTION 61%

[Series 3: cor · coronal · 0.90mm/px · 14 of 125 slices shown, 18 images]
[im 5/125  lung]
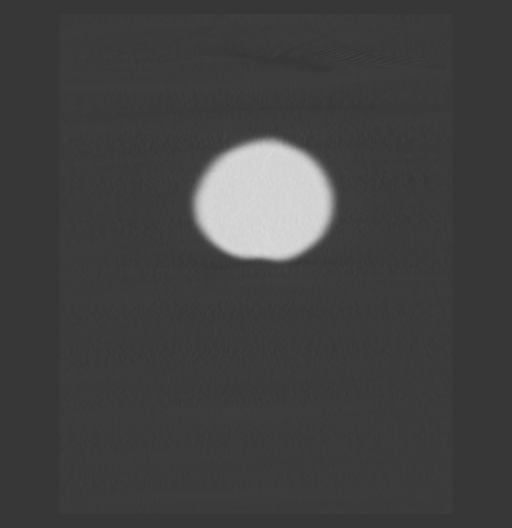
[im 9/125  soft-tissue]
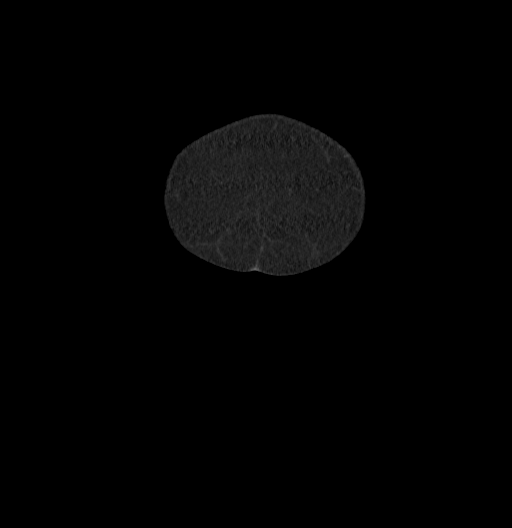
[im 9/125  lung]
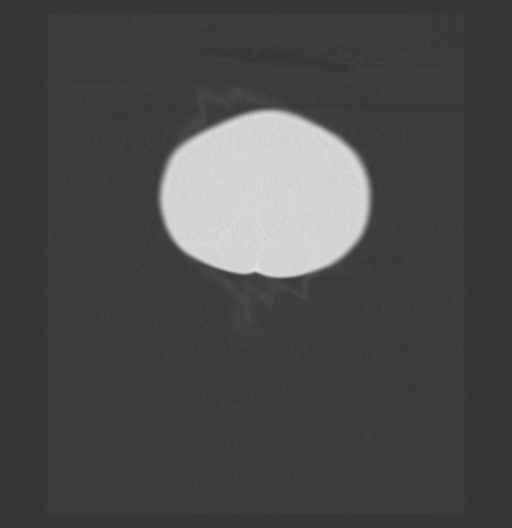
[im 9/125  bone]
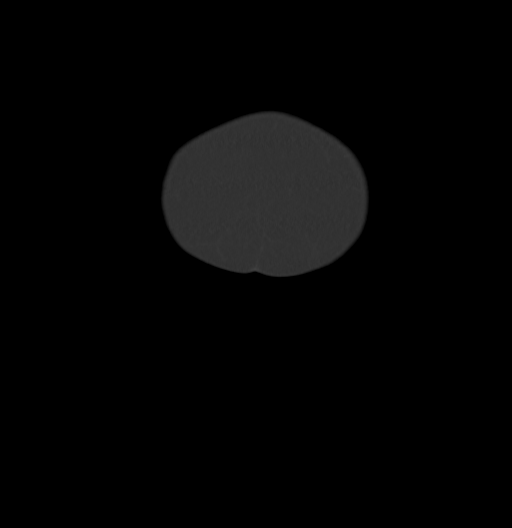
[im 13/125  lung]
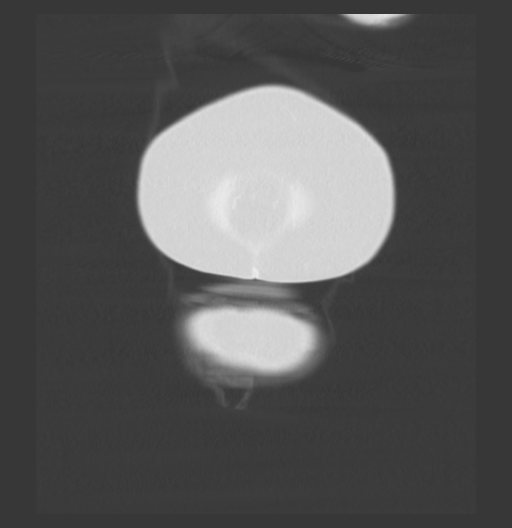
[im 17/125  soft-tissue]
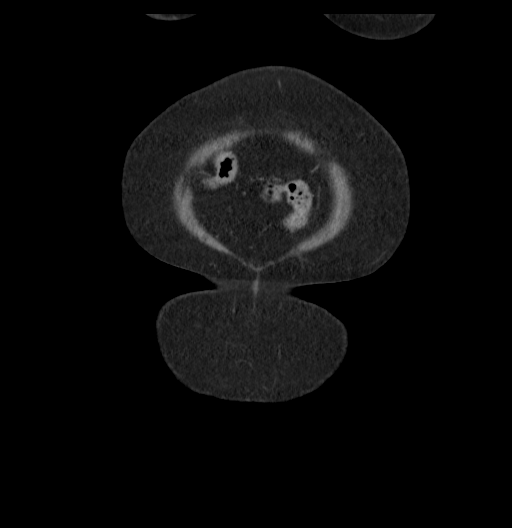
[im 17/125  lung]
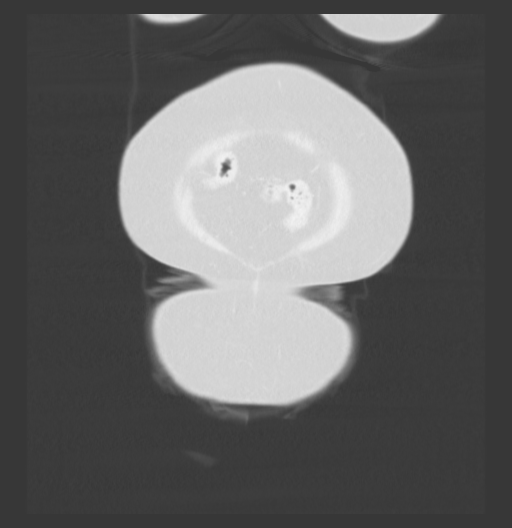
[im 29/125  soft-tissue]
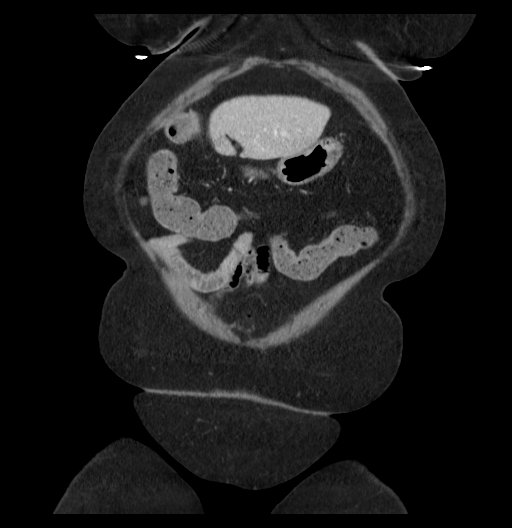
[im 37/125  soft-tissue]
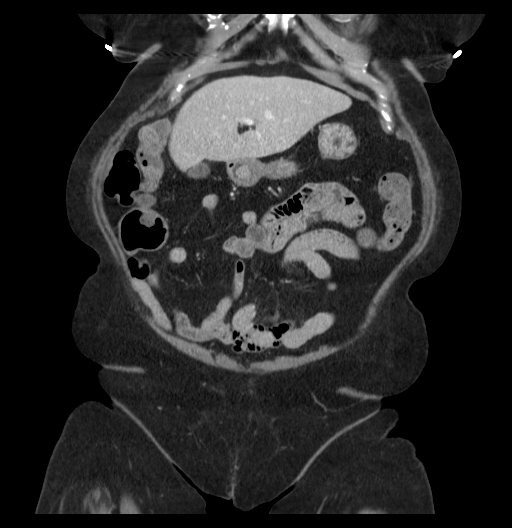
[im 49/125  soft-tissue]
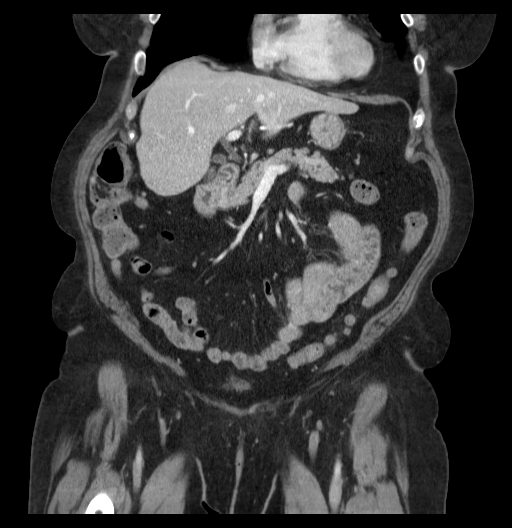
[im 57/125  soft-tissue]
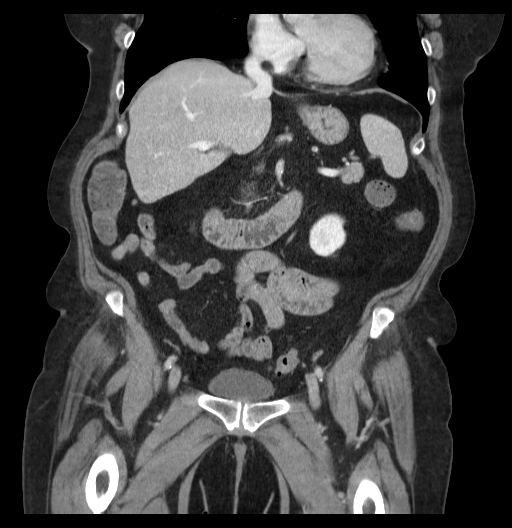
[im 69/125  soft-tissue]
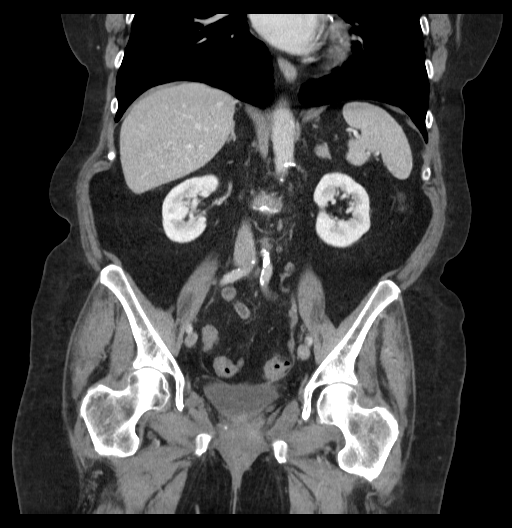
[im 77/125  soft-tissue]
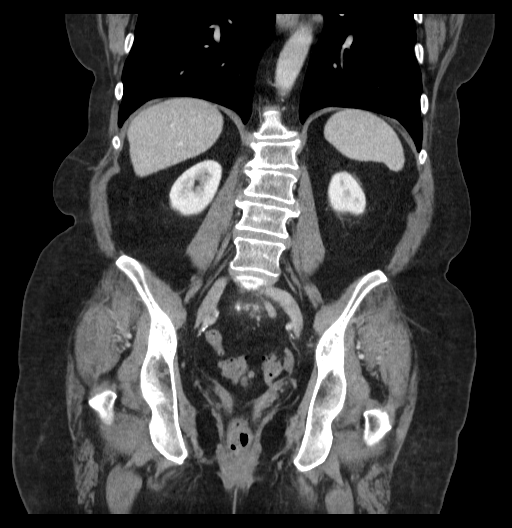
[im 89/125  soft-tissue]
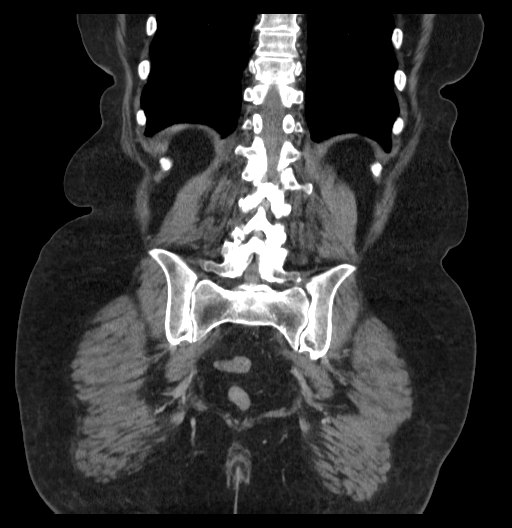
[im 89/125  bone]
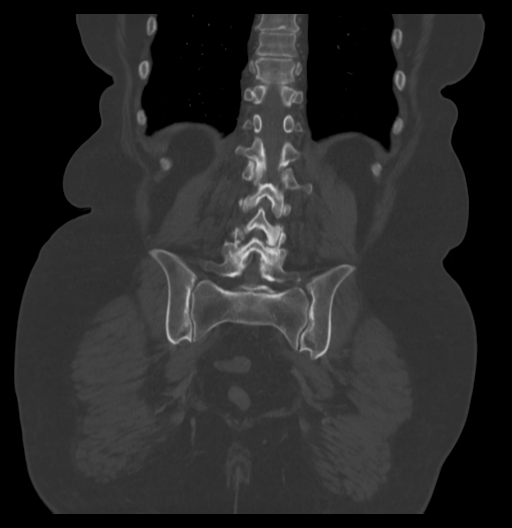
[im 97/125  soft-tissue]
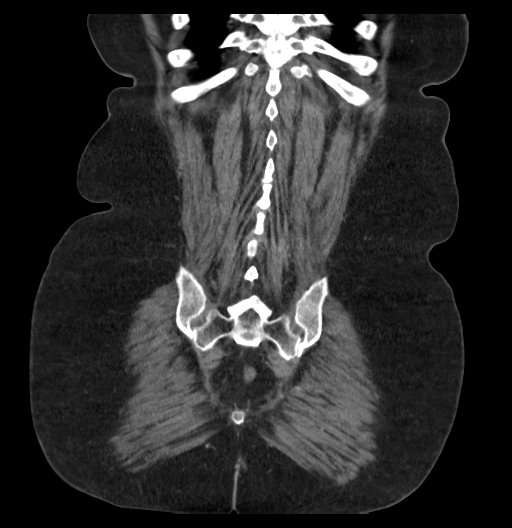
[im 109/125  soft-tissue]
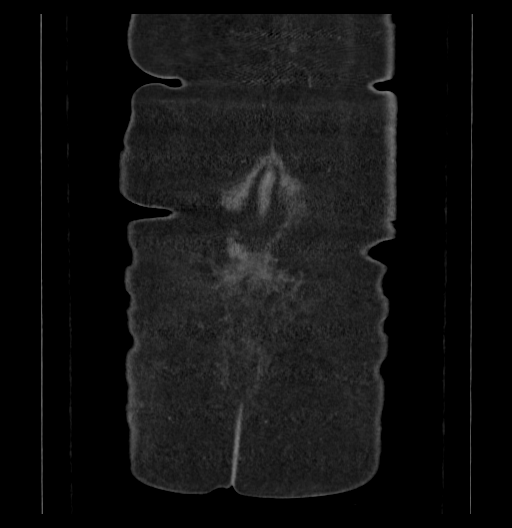
[im 117/125  soft-tissue]
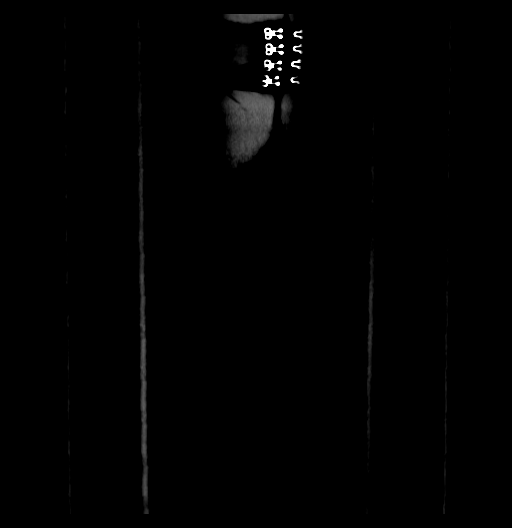

[14 of 32 positions shown; findings below may reference images not displayed]

FINDINGS: Lower chest: Calcified coronary artery atherosclerosis is evident.
No cardiomegaly or pericardial effusion. Lung bases are stable with
mild scarring. No pleural effusion.

Hepatobiliary: Liver and gallbladder appear stable, negative.

Pancreas: Negative.

Spleen: Negative.

Adrenals/Urinary Tract: Stable left adrenal nodularity since 2601
consistent with benign adenoma (18 mm series 2, image 26. Normal
right adrenal gland. Nonobstructed kidneys with symmetric renal
enhancement and contrast excretion. No nephrolithiasis. Both ureters
appear normal to the bladder. Diminutive and unremarkable urinary
bladder.

Stomach/Bowel: Decompressed and negative rectum. Resolved sigmoid
and proximal descending colon inflammation seen in [REDACTED]. Extensive
underlying sigmoid diverticulosis. No active inflammation today.

Elsewhere mildly redundant large bowel and mild retained stool
throughout. The cecum is on a lax mesentery in the right abdomen on
series 2, image 34 with normal retrocecal appendix. Decompressed and
negative terminal ileum. No dilated small bowel. Decompressed,
negative stomach and duodenum. No free air or free fluid.

Vascular/Lymphatic: Aortoiliac calcified atherosclerosis. Major
arterial structures in the abdomen and pelvis are patent. Portal
venous system is patent. No lymphadenopathy.

Reproductive: Surgically absent uterus as before. Left ovary appears
normal along the pelvic side wall on series 2, image 63. The right
ovary is not delineated and may be surgically absent.

Other: No pelvic free fluid.

Musculoskeletal: Stable lumbar spine degeneration. No acute osseous
abnormality identified.
IMPRESSION: 1. Resolved diverticulitis of the distal colon since [REDACTED] with no
adverse features.

2. No acute or inflammatory process identified in the abdomen or
pelvis. No explanation for pelvic pain.
Calcified coronary artery and Aortic Atherosclerosis ([id number]).
Benign left adrenal adenoma.
Chronic lumbar spine degeneration.

## 2020-08-04 MED ORDER — IOPAMIDOL (ISOVUE-300) INJECTION 61%
100.0000 mL | Freq: Once | INTRAVENOUS | Status: AC | PRN
  Administered 2020-08-04: 100 mL via INTRAVENOUS

## 2020-10-11 ENCOUNTER — Other Ambulatory Visit: Payer: Self-pay | Admitting: Cardiology

## 2020-11-16 ENCOUNTER — Telehealth: Payer: Self-pay

## 2020-11-16 NOTE — Telephone Encounter (Signed)
   [facility] Pre-operative Risk Assessment    Patient Name: Kiara Miller  DOB: 1950/11/20 MRN: [id number]  HEARTCARE STAFF:  - IMPORTANT!!!!!! Under Visit Info/Reason for Call, type in Other and utilize the format Clearance MM/DD/YY or Clearance TBD. Do not use dashes or single digits. - Please review there is not already an duplicate clearance open for this procedure. - If request is for dental extraction, please clarify the # of teeth to be extracted. - If the patient is currently at the dentist's office, call Pre-Op Callback Staff (MA/nurse) to input urgent request.  - If the patient is not currently in the dentist office, please route to the Pre-Op pool.  Request for surgical clearance:  What type of surgery is being performed? Lumbar transforaminal epidural steroid injection  When is this surgery scheduled? TBD  What type of clearance is required (medical clearance vs. Pharmacy clearance to hold med vs. Both)? Pharmacy   Are there any medications that need to be held prior to surgery and how long? Plavix  Practice name and name of physician performing surgery? [facility]; Francesco Runner, MD  What is the office phone number? [phone]   7.   What is the office fax number? [phone]  8.   Anesthesia type (None, local, MAC, general) ? N/A   Please advise if patient can hold Plavix for 7 days prior to procedure.   Kiara Miller July 11/16/2020, 10:50 AM  _________________________________________________________________   (provider comments below)

## 2020-11-16 NOTE — Telephone Encounter (Signed)
    Patient Name: Kiara Miller  DOB: 19-Oct-1950 MRN: [id number]  Primary Cardiologist: Bryan Lemma, MD  Chart reviewed as part of pre-operative protocol coverage. Given past medical history and time since last visit, based on ACC/AHA guidelines, Kiara Miller would be at acceptable risk for the planned procedure without further cardiovascular testing.  She denies any recent chest discomfort or worsening dyspnea.  Depression has also improved.  She has been instructed to hold Plavix for 7 days prior to back injection and restart it as soon as possible afterward at the surgeon's discretion.  The patient was advised that if she develops new symptoms prior to surgery to contact our office to arrange for a follow-up visit, and she verbalized understanding.  I will route this recommendation to the requesting party via Epic fax function and remove from pre-op pool.  Please call with questions.  [place], [place] 11/16/2020, 6:35 PM

## 2020-12-14 ENCOUNTER — Other Ambulatory Visit: Payer: Self-pay

## 2020-12-14 ENCOUNTER — Ambulatory Visit: Payer: Medicare Other | Attending: Nurse Practitioner | Admitting: Physical Therapy

## 2020-12-14 DIAGNOSIS — M5442 Lumbago with sciatica, left side: Secondary | ICD-10-CM | POA: Insufficient documentation

## 2020-12-14 DIAGNOSIS — G8929 Other chronic pain: Secondary | ICD-10-CM | POA: Insufficient documentation

## 2020-12-14 NOTE — Therapy (Signed)
[facility] [address] [place], [place], [address] Phone: [phone]   Fax:  [phone]  Physical Therapy Evaluation  Patient Details  Name: SUMNER BOESCH MRN: [id number] Date of Birth: [birth year] Referring Provider (PT): Macario Carls   Encounter Date: 12/14/2020   PT End of Session - 12/14/20 1455     Visit Number 1    Number of Visits 12    Date for PT Re-Evaluation 01/25/21    PT Start Time 0108    PT Stop Time 0200    PT Time Calculation (min) 52 min    Activity Tolerance Patient tolerated treatment well    Behavior During Therapy [facility] for tasks assessed/performed             Past Medical History:  Diagnosis Date   CAD S/P percutaneous coronary angioplasty 10/05/2019   Cardiac cath revealed 85% mid LAD between SP1 and D1 (DES PCI with resolute Onyx DES 2.5 mm x 15 mm-negative for 8 mm); extensive moderate CT FFR and DFR negative proximal to mid RCA 60% and mid focal 45% as well as proximal to mid LCx 60% eccentric.  Both DFR was 0.97   Diverticulitis 05/2015   Hyperlipidemia    Hypertension    Joint pain    Osteoarthritis    Polyarthralgia    Rheumatoid arthritis (HCC)    Right knee pain     Past Surgical History:  Procedure Laterality Date   ABDOMINAL HYSTERECTOMY     CARPAL TUNNEL RELEASE     CORONARY STENT INTERVENTION N/A 10/05/2019   Procedure: CORONARY STENT INTERVENTION;  Surgeon: Marykay Lex, MD;  Location: [facility] INVASIVE CV LAB;  Service: Cardiovascular;  Laterality: N/A;; 85% mid LAD irregular lesion.  DES PCI reducing to 0% with resolute Onyx DES 2.5 mm x15 mm--2.8 mm.   KNEE ARTHROPLASTY     LEFT HEART CATH AND CORONARY ANGIOGRAPHY N/A 10/05/2019   Procedure: LEFT HEART CATH AND CORONARY ANGIOGRAPHY;  Surgeon: Marykay Lex, MD;  Location: [facility] INVASIVE CV LAB;  Service: Cardiovascular;: Mid LAD irregular 85% (DES PCI).  Extensive 60 to 65% proximal to mid RCA followed by 45% mid-distal RCA--DFR  negative (0.97).  Proximal-mid LCx 60% eccentric lesion-DFR 0.97.  (Positive for DFR is<0.9)   MANDIBLE SURGERY     NM MYOVIEW LTD  06/2017   (Ordered for coronary calcium score 376) LOW RISK.  EF 72%.  No ischemia or infarction.  No wall motion normality.  No change from previous.   OSTEOTOMY PELVIS BILATERAL     TONSILLECTOMY     TONSILLECTOMY     TOTAL KNEE ARTHROPLASTY Right 09/02/2018   Procedure: TOTAL KNEE ARTHROPLASTY;  Surgeon: Durene Romans, MD;  Location: [facility];  Service: Orthopedics;  Laterality: Right;  70 mins   TRANSTHORACIC ECHOCARDIOGRAM  12/2010   EF 65-70%. Mild LVH. No RWMA/ Gr1 DD.   TUBAL LIGATION     WRIST FUSION      There were no vitals filed for this visit.    Subjective Assessment - 12/14/20 1334     Subjective COVID-19 screen performed prior to patient entering clinic.  The patient presenst to the clinic today with c/o low back pain, most recently in August of this year (2022).  She was having numbness over her left anterior thigh but a recent injection has been helpful and she has no c/o this today.  She feels a compressive force in her low back that increases the longer she stands and walks.  For example, within 10-15 minutes of washing dishes she has to lean on the sink due to a significant rise in her pain.  She has had occasions when her left LE has given way while walking.  She has had problems with Coccydynia in the past that has responded well to injections.    Pertinent History CAd (stents in place), right TKA, RA, CTR, HTN, wrist fusion. "OP" (per patient report).    How long can you stand comfortably? 10-15 minutes.    How long can you walk comfortably? 10-15 minutes.    Diagnostic tests MRI (see under 'Media" tab).    Patient Stated Goals Get out of pain and do more without pain.    Currently in Pain? Yes    Pain Score 6     Pain Location Back    Pain Orientation Right;Left    Pain Descriptors / Indicators Aching    Pain Type Acute pain    Pain  Onset More than a month ago    Pain Frequency Constant    Aggravating Factors  See above.    Pain Relieving Factors See above.                [facility] PT Assessment - 12/14/20 0001       Assessment   Medical Diagnosis Lumbar degenerative disc disease.    Referring Provider (PT) Macario Carls    Onset Date/Surgical Date --   August 2022.     Precautions   Precaution Comments Supervised gait.      Restrictions   Weight Bearing Restrictions No      Balance Screen   Has the patient fallen in the past 6 months Yes    How many times? 1.  "Several close calls.".    Has the patient had a decrease in activity level because of a fear of falling?  --   "?"   Is the patient reluctant to leave their home because of a fear of falling?  No      Home Environment   Living Environment Private residence      Prior Function   Level of Independence Independent      Observation/Other Assessments   Focus on Therapeutic Outcomes (FOTO)  Complete.      Posture/Postural Control   Posture/Postural Control Postural limitations    Postural Limitations Rounded Shoulders;Forward head      Deep Tendon Reflexes   DTR Assessment Site Patella;Achilles    Patella DTR 2+    Achilles DTR 2+      ROM / Strength   AROM / PROM / Strength AROM;Strength      AROM   Overall AROM Comments Active lumbar flexion is limited by 50% and active extension is 20 degrees.      Strength   Overall Strength Comments Normal LE strength via manual muscle testing.      Palpation   Palpation comment Tender and taut to palpation over patient's bilateral erector spinae musculature especially from L2 to L4.  She feels a "compressive" force in the region.      Special Tests   Other special tests Equal leg lengths.  (-) SLR testing.      Bed Mobility   Bed Mobility Supine to Sit;Sit to Supine    Supine to Sit Contact Guard/Touching assist   Painful transition.   Sit to Supine Supervision/Verbal cueing   Painful  transition.     Ambulation/Gait   Gait Comments Slow and cautious and patient appeasr  to be in pain.                        Objective measurements completed on examination: See above findings.       [facility] Adult PT Treatment/Exercise - 12/14/20 0001       Modalities   Modalities Electrical Stimulation;Moist Heat      Moist Heat Therapy   Number Minutes Moist Heat 20 Minutes    Moist Heat Location Lumbar Spine      Electrical Stimulation   Electrical Stimulation Location Bilateral lumbar.    Electrical Stimulation Action IFC at 80-150 Hz.    Electrical Stimulation Parameters 40% scan x 20 minutes.    Electrical Stimulation Goals Pain;Tone                          PT Long Term Goals - 12/14/20 1453       PT LONG TERM GOAL #1   Title Independent with a HEP.    Time 6    Period Weeks    Status New      PT LONG TERM GOAL #2   Title Stand 20 minutes wiht LBP not > 3-4/10.    Time 6    Period Weeks    Status New      PT LONG TERM GOAL #3   Title Patient able to walk a community distance.    Time 6    Period Weeks    Status New      PT LONG TERM GOAL #4   Title No c/o left LE giving way.    Time 6    Period Weeks    Status New                    Plan - 12/14/20 1448     Clinical Impression Statement The patient presents to [facility] with low back pain that has been quite bad since August (2022).  She states an injection has essentially resolved the numbness over her left anterior thigh she was experiencing.  She has a limited ability to stand and walk due to pain.  She feels a "comporessive" force in her lower back region.  Her active lumbar flexion is decreased by 50% and her lumbar erector spinae musculature (especially L2-4) is quite taut to palpation.  She has had occasions of her left LE giving way and as a result her gait is slow and cautious.  Patient will benefit from skilled physical therapy intervention to address pain  and deficits.    Personal Factors and Comorbidities Comorbidity 1;Other    Comorbidities CAd (stents in place), right TKA, RA, CTR, HTN, wrist fusion. "OP" (per patient report).    Examination-Activity Limitations Other;Transfers;Locomotion Level;Stand    Examination-Participation Restrictions Other;Meal Prep    Stability/Clinical Decision Making Stable/Uncomplicated    Clinical Decision Making Low    Rehab Potential Excellent    PT Frequency 2x / week    PT Duration 6 weeks    PT Treatment/Interventions ADLs/Self Care Home Management;Cryotherapy;Electrical Stimulation;Ultrasound;Moist Heat;Functional mobility training;Therapeutic activities;Therapeutic exercise;Manual techniques;Patient/family education;Passive range of motion    PT Next Visit Plan Combo e'stim/US; STW/M, core exercise progression including back and ab machine.    Consulted and Agree with Plan of Care Patient             Patient will benefit from skilled therapeutic intervention in order to improve the following deficits and impairments:  Pain,  Decreased activity tolerance, Decreased mobility, Decreased range of motion, Increased muscle spasms  Visit Diagnosis: Chronic bilateral low back pain with left-sided sciatica - Plan: PT plan of care cert/re-cert     Problem List Patient Active Problem List   Diagnosis Date Noted   Hyperlipidemia LDL goal <70 10/27/2019   Progressive angina (HCC) 10/05/2019   Abnormal cardiac CT angiography 10/05/2019   CAD S/P percutaneous coronary angioplasty 10/05/2019   Feeling of chest tightness 09/04/2019   Depression 04/09/2019   Obese 09/03/2018   S/P right TKA 09/02/2018   PAC (premature atrial contraction) 07/11/2017   History of hypothyroidism 06/11/2017   Essential hypertension 06/11/2017   History of obesity 06/11/2017   S/P right knee arthroscopy 06/11/2017   DDD (degenerative disc disease), cervical 06/11/2017   DDD (degenerative disc disease), lumbar 06/11/2017    History of carpal tunnel surgery of right wrist 06/11/2017   Primary osteoarthritis of both knees 06/11/2017   Chest pain, precordial 05/05/2017   PVC's (premature ventricular contractions) 05/03/2017   Agatston coronary artery calcium score between 200 and 399 05/03/2017   Rheumatoid arthritis of multiple sites without rheumatoid factor (HCC) 10/10/2015   Uveitis 10/10/2015   Diverticulitis 10/10/2015   Dyspnea 12/05/2010   Palpitations 12/05/2010    Lennyn Bellanca, [place], PT 12/14/2020, 2:58 PM  [facility] [address] [place], [place], [address] Phone: [phone]   Fax:  [phone]  Name: RASHAWN ROLON MRN: [id number] Date of Birth: 07-04-1950

## 2020-12-19 ENCOUNTER — Ambulatory Visit: Payer: Medicare Other | Admitting: Physical Therapy

## 2020-12-19 ENCOUNTER — Other Ambulatory Visit: Payer: Self-pay

## 2020-12-19 DIAGNOSIS — G8929 Other chronic pain: Secondary | ICD-10-CM

## 2020-12-19 DIAGNOSIS — M5442 Lumbago with sciatica, left side: Secondary | ICD-10-CM | POA: Diagnosis not present

## 2020-12-19 NOTE — Therapy (Signed)
[facility] [address] [place], [place], [address] Phone: [phone]   Fax:  [phone]  Physical Therapy Treatment  Patient Details  Name: Kiara Miller MRN: [id number] Date of Birth: [birth year] Referring Provider (PT): Macario Carls   Encounter Date: 12/19/2020   PT End of Session - 12/19/20 1552     Visit Number 3    Number of Visits 12    Date for PT Re-Evaluation 01/25/21    PT Start Time 0103    PT Stop Time 0200    PT Time Calculation (min) 57 min    Activity Tolerance Patient tolerated treatment well             Past Medical History:  Diagnosis Date   CAD S/P percutaneous coronary angioplasty 10/05/2019   Cardiac cath revealed 85% mid LAD between SP1 and D1 (DES PCI with resolute Onyx DES 2.5 mm x 15 mm-negative for 8 mm); extensive moderate CT FFR and DFR negative proximal to mid RCA 60% and mid focal 45% as well as proximal to mid LCx 60% eccentric.  Both DFR was 0.97   Diverticulitis 05/2015   Hyperlipidemia    Hypertension    Joint pain    Osteoarthritis    Polyarthralgia    Rheumatoid arthritis (HCC)    Right knee pain     Past Surgical History:  Procedure Laterality Date   ABDOMINAL HYSTERECTOMY     CARPAL TUNNEL RELEASE     CORONARY STENT INTERVENTION N/A 10/05/2019   Procedure: CORONARY STENT INTERVENTION;  Surgeon: Marykay Lex, MD;  Location: [facility] INVASIVE CV LAB;  Service: Cardiovascular;  Laterality: N/A;; 85% mid LAD irregular lesion.  DES PCI reducing to 0% with resolute Onyx DES 2.5 mm x15 mm--2.8 mm.   KNEE ARTHROPLASTY     LEFT HEART CATH AND CORONARY ANGIOGRAPHY N/A 10/05/2019   Procedure: LEFT HEART CATH AND CORONARY ANGIOGRAPHY;  Surgeon: Marykay Lex, MD;  Location: [facility] INVASIVE CV LAB;  Service: Cardiovascular;: Mid LAD irregular 85% (DES PCI).  Extensive 60 to 65% proximal to mid RCA followed by 45% mid-distal RCA--DFR negative (0.97).  Proximal-mid LCx 60% eccentric lesion-DFR  0.97.  (Positive for DFR is<0.9)   MANDIBLE SURGERY     NM MYOVIEW LTD  06/2017   (Ordered for coronary calcium score 376) LOW RISK.  EF 72%.  No ischemia or infarction.  No wall motion normality.  No change from previous.   OSTEOTOMY PELVIS BILATERAL     TONSILLECTOMY     TONSILLECTOMY     TOTAL KNEE ARTHROPLASTY Right 09/02/2018   Procedure: TOTAL KNEE ARTHROPLASTY;  Surgeon: Durene Romans, MD;  Location: [facility];  Service: Orthopedics;  Laterality: Right;  70 mins   TRANSTHORACIC ECHOCARDIOGRAM  12/2010   EF 65-70%. Mild LVH. No RWMA/ Gr1 DD.   TUBAL LIGATION     WRIST FUSION      There were no vitals filed for this visit.   Subjective Assessment - 12/19/20 1336     Subjective COVID-19 screen performed prior to patient entering clinic.  No new complaints.    Pertinent History CAd (stents in place), right TKA, RA, CTR, HTN, wrist fusion. "OP" (per patient report).    How long can you stand comfortably? 10-15 minutes.    How long can you walk comfortably? 10-15 minutes.    Diagnostic tests MRI (see under 'Media" tab).    Patient Stated Goals Get out of pain and do more without pain.  Currently in Pain? Yes    Pain Score 6     Pain Location Back    Pain Orientation Right;Left    Pain Descriptors / Indicators Aching    Pain Type Acute pain    Pain Onset More than a month ago                               [facility] Adult PT Treatment/Exercise - 12/19/20 0001       Modalities   Modalities Electrical Stimulation;Ultrasound;Moist Heat      Moist Heat Therapy   Number Minutes Moist Heat 20 Minutes    Moist Heat Location Lumbar Spine      Electrical Stimulation   Electrical Stimulation Location Bilateral lumbar    Electrical Stimulation Action IFC at 80-150 Hz.    Electrical Stimulation Parameters 40% scan x 20 minutes.    Electrical Stimulation Goals Pain;Tone      Ultrasound   Ultrasound Location Bilateral lumbar    Ultrasound Parameters Combo  e'stim/US at 1.50 W/CM2 x 12 minutes.      Manual Therapy   Manual Therapy Soft tissue mobilization    Soft tissue mobilization STW/M x 11 minutes to patient's affected                          PT Long Term Goals - 12/14/20 1453       PT LONG TERM GOAL #1   Title Independent with a HEP.    Time 6    Period Weeks    Status New      PT LONG TERM GOAL #2   Title Stand 20 minutes wiht LBP not > 3-4/10.    Time 6    Period Weeks    Status New      PT LONG TERM GOAL #3   Title Patient able to walk a community distance.    Time 6    Period Weeks    Status New      PT LONG TERM GOAL #4   Title No c/o left LE giving way.    Time 6    Period Weeks    Status New                   Plan - 12/19/20 1417     Clinical Impression Statement Patient did well with treatment today.  She was found to have continued tenderness over her lumbar musculature especially in the left upper lumbar region today.  She did well with STW/M today.    Personal Factors and Comorbidities Comorbidity 1;Other    Comorbidities CAd (stents in place), right TKA, RA, CTR, HTN, wrist fusion. "OP" (per patient report).    Examination-Activity Limitations Other;Transfers;Locomotion Level;Stand    Examination-Participation Restrictions Other;Meal Prep    Stability/Clinical Decision Making Stable/Uncomplicated    Rehab Potential Excellent    PT Frequency 2x / week    PT Duration 6 weeks    PT Treatment/Interventions ADLs/Self Care Home Management;Cryotherapy;Electrical Stimulation;Ultrasound;Moist Heat;Functional mobility training;Therapeutic activities;Therapeutic exercise;Manual techniques;Patient/family education;Passive range of motion    PT Next Visit Plan Combo e'stim/US; STW/M, core exercise progression including back and ab machine.    Consulted and Agree with Plan of Care Patient             Patient will benefit from skilled therapeutic intervention in order to improve the  following deficits and impairments:  Pain, Decreased  activity tolerance, Decreased mobility, Decreased range of motion, Increased muscle spasms  Visit Diagnosis: Chronic bilateral low back pain with left-sided sciatica     Problem List Patient Active Problem List   Diagnosis Date Noted   Hyperlipidemia LDL goal <70 10/27/2019   Progressive angina (HCC) 10/05/2019   Abnormal cardiac CT angiography 10/05/2019   CAD S/P percutaneous coronary angioplasty 10/05/2019   Feeling of chest tightness 09/04/2019   Depression 04/09/2019   Obese 09/03/2018   S/P right TKA 09/02/2018   PAC (premature atrial contraction) 07/11/2017   History of hypothyroidism 06/11/2017   Essential hypertension 06/11/2017   History of obesity 06/11/2017   S/P right knee arthroscopy 06/11/2017   DDD (degenerative disc disease), cervical 06/11/2017   DDD (degenerative disc disease), lumbar 06/11/2017   History of carpal tunnel surgery of right wrist 06/11/2017   Primary osteoarthritis of both knees 06/11/2017   Chest pain, precordial 05/05/2017   PVC's (premature ventricular contractions) 05/03/2017   Agatston coronary artery calcium score between 200 and 399 05/03/2017   Rheumatoid arthritis of multiple sites without rheumatoid factor (HCC) 10/10/2015   Uveitis 10/10/2015   Diverticulitis 10/10/2015   Dyspnea 12/05/2010   Palpitations 12/05/2010    Emaad Nanna, [place], PT 12/19/2020, 3:54 PM  [facility] [address] [place], [place], [address] Phone: [phone]   Fax:  [phone]  Name: JOSSALYN FORGIONE MRN: [id number] Date of Birth: November 24, 1950

## 2020-12-22 ENCOUNTER — Other Ambulatory Visit: Payer: Self-pay

## 2020-12-22 ENCOUNTER — Ambulatory Visit: Payer: Medicare Other | Admitting: Physical Therapy

## 2020-12-22 DIAGNOSIS — G8929 Other chronic pain: Secondary | ICD-10-CM

## 2020-12-22 DIAGNOSIS — M5442 Lumbago with sciatica, left side: Secondary | ICD-10-CM | POA: Diagnosis not present

## 2020-12-22 NOTE — Therapy (Signed)
[facility] [address] [place], [place], [address] Phone: [phone]   Fax:  [phone]  Physical Therapy Treatment  Patient Details  Name: Kiara Miller MRN: [id number] Date of Birth: [birth year] Referring Provider (PT): Macario Carls   Encounter Date: 12/22/2020   PT End of Session - 12/22/20 1154     Visit Number 4    Date for PT Re-Evaluation 01/25/21    PT Start Time 1117    PT Stop Time 1205    PT Time Calculation (min) 48 min    Activity Tolerance Patient tolerated treatment well    Behavior During Therapy [facility] for tasks assessed/performed             Past Medical History:  Diagnosis Date   CAD S/P percutaneous coronary angioplasty 10/05/2019   Cardiac cath revealed 85% mid LAD between SP1 and D1 (DES PCI with resolute Onyx DES 2.5 mm x 15 mm-negative for 8 mm); extensive moderate CT FFR and DFR negative proximal to mid RCA 60% and mid focal 45% as well as proximal to mid LCx 60% eccentric.  Both DFR was 0.97   Diverticulitis 05/2015   Hyperlipidemia    Hypertension    Joint pain    Osteoarthritis    Polyarthralgia    Rheumatoid arthritis (HCC)    Right knee pain     Past Surgical History:  Procedure Laterality Date   ABDOMINAL HYSTERECTOMY     CARPAL TUNNEL RELEASE     CORONARY STENT INTERVENTION N/A 10/05/2019   Procedure: CORONARY STENT INTERVENTION;  Surgeon: Marykay Lex, MD;  Location: [facility] INVASIVE CV LAB;  Service: Cardiovascular;  Laterality: N/A;; 85% mid LAD irregular lesion.  DES PCI reducing to 0% with resolute Onyx DES 2.5 mm x15 mm--2.8 mm.   KNEE ARTHROPLASTY     LEFT HEART CATH AND CORONARY ANGIOGRAPHY N/A 10/05/2019   Procedure: LEFT HEART CATH AND CORONARY ANGIOGRAPHY;  Surgeon: Marykay Lex, MD;  Location: [facility] INVASIVE CV LAB;  Service: Cardiovascular;: Mid LAD irregular 85% (DES PCI).  Extensive 60 to 65% proximal to mid RCA followed by 45% mid-distal RCA--DFR negative (0.97).   Proximal-mid LCx 60% eccentric lesion-DFR 0.97.  (Positive for DFR is<0.9)   MANDIBLE SURGERY     NM MYOVIEW LTD  06/2017   (Ordered for coronary calcium score 376) LOW RISK.  EF 72%.  No ischemia or infarction.  No wall motion normality.  No change from previous.   OSTEOTOMY PELVIS BILATERAL     TONSILLECTOMY     TONSILLECTOMY     TOTAL KNEE ARTHROPLASTY Right 09/02/2018   Procedure: TOTAL KNEE ARTHROPLASTY;  Surgeon: Durene Romans, MD;  Location: [facility];  Service: Orthopedics;  Laterality: Right;  70 mins   TRANSTHORACIC ECHOCARDIOGRAM  12/2010   EF 65-70%. Mild LVH. No RWMA/ Gr1 DD.   TUBAL LIGATION     WRIST FUSION      There were no vitals filed for this visit.   Subjective Assessment - 12/22/20 1156     Subjective COVID-19 screen performed prior to patient entering clinic.  Doing okay.    Pertinent History CAd (stents in place), right TKA, RA, CTR, HTN, wrist fusion. "OP" (per patient report).    How long can you stand comfortably? 10-15 minutes.    How long can you walk comfortably? 10-15 minutes.    Diagnostic tests MRI (see under 'Media" tab).    Patient Stated Goals Get out of pain and do more without pain.  Currently in Pain? Yes    Pain Score 5     Pain Location Back    Pain Orientation Right;Left    Pain Descriptors / Indicators Aching    Pain Type Acute pain    Pain Onset More than a month ago                               [facility] Adult PT Treatment/Exercise - 12/22/20 0001       Modalities   Modalities Electrical Stimulation;Moist Heat;Ultrasound      Moist Heat Therapy   Number Minutes Moist Heat 15 Minutes    Moist Heat Location Lumbar Spine      Electrical Stimulation   Electrical Stimulation Location Bil LB    Electrical Stimulation Action IFC at 80-150 Hz.    Electrical Stimulation Parameters 40% scan x 15 minutes.    Electrical Stimulation Goals Tone;Pain      Ultrasound   Ultrasound Location Bil LB    Ultrasound Parameters  Combo e'stim/US at 1.50 W/CM2 x 12 minutes.      Manual Therapy   Manual Therapy Soft tissue mobilization    Soft tissue mobilization STW/M x 11 minutes to patient's bilateral lumbar musculature.                          PT Long Term Goals - 12/14/20 1453       PT LONG TERM GOAL #1   Title Independent with a HEP.    Time 6    Period Weeks    Status New      PT LONG TERM GOAL #2   Title Stand 20 minutes wiht LBP not > 3-4/10.    Time 6    Period Weeks    Status New      PT LONG TERM GOAL #3   Title Patient able to walk a community distance.    Time 6    Period Weeks    Status New      PT LONG TERM GOAL #4   Title No c/o left LE giving way.    Time 6    Period Weeks    Status New                   Plan - 12/22/20 1159     Clinical Impression Statement Patient doing well.  LBP increases with activity.  She continues to have palpable tenderness especially inher upper lumbar region.    Personal Factors and Comorbidities Comorbidity 1;Other    Comorbidities CAd (stents in place), right TKA, RA, CTR, HTN, wrist fusion. "OP" (per patient report).    Examination-Activity Limitations Other;Transfers;Locomotion Level;Stand    Examination-Participation Restrictions Other;Meal Prep    Stability/Clinical Decision Making Stable/Uncomplicated    Rehab Potential Excellent    PT Frequency 2x / week    PT Duration 6 weeks    PT Treatment/Interventions ADLs/Self Care Home Management;Cryotherapy;Electrical Stimulation;Ultrasound;Moist Heat;Functional mobility training;Therapeutic activities;Therapeutic exercise;Manual techniques;Patient/family education;Passive range of motion    PT Next Visit Plan Combo e'stim/US; STW/M, core exercise progression including back and ab machine.    Consulted and Agree with Plan of Care Patient             Patient will benefit from skilled therapeutic intervention in order to improve the following deficits and impairments:   Pain, Decreased activity tolerance, Decreased mobility, Decreased range of motion, Increased muscle spasms  Visit Diagnosis: Chronic bilateral low back pain with left-sided sciatica     Problem List Patient Active Problem List   Diagnosis Date Noted   Hyperlipidemia LDL goal <70 10/27/2019   Progressive angina (HCC) 10/05/2019   Abnormal cardiac CT angiography 10/05/2019   CAD S/P percutaneous coronary angioplasty 10/05/2019   Feeling of chest tightness 09/04/2019   Depression 04/09/2019   Obese 09/03/2018   S/P right TKA 09/02/2018   PAC (premature atrial contraction) 07/11/2017   History of hypothyroidism 06/11/2017   Essential hypertension 06/11/2017   History of obesity 06/11/2017   S/P right knee arthroscopy 06/11/2017   DDD (degenerative disc disease), cervical 06/11/2017   DDD (degenerative disc disease), lumbar 06/11/2017   History of carpal tunnel surgery of right wrist 06/11/2017   Primary osteoarthritis of both knees 06/11/2017   Chest pain, precordial 05/05/2017   PVC's (premature ventricular contractions) 05/03/2017   Agatston coronary artery calcium score between 200 and 399 05/03/2017   Rheumatoid arthritis of multiple sites without rheumatoid factor (HCC) 10/10/2015   Uveitis 10/10/2015   Diverticulitis 10/10/2015   Dyspnea 12/05/2010   Palpitations 12/05/2010    Eathon Valade, [place], PT 12/22/2020, 12:13 PM  [facility] [address] [place], [place], [address] Phone: [phone]   Fax:  [phone]  Name: Kiara Miller MRN: [id number] Date of Birth: 04-04-1950

## 2020-12-27 ENCOUNTER — Ambulatory Visit: Payer: Medicare Other | Admitting: *Deleted

## 2020-12-27 ENCOUNTER — Other Ambulatory Visit: Payer: Self-pay

## 2020-12-27 DIAGNOSIS — G8929 Other chronic pain: Secondary | ICD-10-CM

## 2020-12-27 DIAGNOSIS — M5442 Lumbago with sciatica, left side: Secondary | ICD-10-CM | POA: Diagnosis not present

## 2020-12-27 NOTE — Therapy (Signed)
[facility] [address] [place], [place], [address] Phone: [phone]   Fax:  [phone]  Physical Therapy Treatment  Patient Details  Name: Kiara Miller MRN: [id number] Date of Birth: 1950/11/06 Referring Provider (PT): Macario Carls   Encounter Date: 12/27/2020   PT End of Session - 12/27/20 1122     Visit Number 5    Number of Visits 12    Date for PT Re-Evaluation 01/25/21    PT Start Time 1117    PT Stop Time 1208    PT Time Calculation (min) 51 min             Past Medical History:  Diagnosis Date   CAD S/P percutaneous coronary angioplasty 10/05/2019   Cardiac cath revealed 85% mid LAD between SP1 and D1 (DES PCI with resolute Onyx DES 2.5 mm x 15 mm-negative for 8 mm); extensive moderate CT FFR and DFR negative proximal to mid RCA 60% and mid focal 45% as well as proximal to mid LCx 60% eccentric.  Both DFR was 0.97   Diverticulitis 05/2015   Hyperlipidemia    Hypertension    Joint pain    Osteoarthritis    Polyarthralgia    Rheumatoid arthritis (HCC)    Right knee pain     Past Surgical History:  Procedure Laterality Date   ABDOMINAL HYSTERECTOMY     CARPAL TUNNEL RELEASE     CORONARY STENT INTERVENTION N/A 10/05/2019   Procedure: CORONARY STENT INTERVENTION;  Surgeon: Marykay Lex, MD;  Location: [facility] INVASIVE CV LAB;  Service: Cardiovascular;  Laterality: N/A;; 85% mid LAD irregular lesion.  DES PCI reducing to 0% with resolute Onyx DES 2.5 mm x15 mm--2.8 mm.   KNEE ARTHROPLASTY     LEFT HEART CATH AND CORONARY ANGIOGRAPHY N/A 10/05/2019   Procedure: LEFT HEART CATH AND CORONARY ANGIOGRAPHY;  Surgeon: Marykay Lex, MD;  Location: [facility] INVASIVE CV LAB;  Service: Cardiovascular;: Mid LAD irregular 85% (DES PCI).  Extensive 60 to 65% proximal to mid RCA followed by 45% mid-distal RCA--DFR negative (0.97).  Proximal-mid LCx 60% eccentric lesion-DFR 0.97.  (Positive for DFR is<0.9)   MANDIBLE SURGERY     NM  MYOVIEW LTD  06/2017   (Ordered for coronary calcium score 376) LOW RISK.  EF 72%.  No ischemia or infarction.  No wall motion normality.  No change from previous.   OSTEOTOMY PELVIS BILATERAL     TONSILLECTOMY     TONSILLECTOMY     TOTAL KNEE ARTHROPLASTY Right 09/02/2018   Procedure: TOTAL KNEE ARTHROPLASTY;  Surgeon: Durene Romans, MD;  Location: [facility];  Service: Orthopedics;  Laterality: Right;  70 mins   TRANSTHORACIC ECHOCARDIOGRAM  12/2010   EF 65-70%. Mild LVH. No RWMA/ Gr1 DD.   TUBAL LIGATION     WRIST FUSION      There were no vitals filed for this visit.   Subjective Assessment - 12/27/20 1121     Subjective COVID-19 screen performed prior to patient entering clinic.  Doing okay.    Pertinent History CAd (stents in place), right TKA, RA, CTR, HTN, wrist fusion. "OP" (per patient report).    How long can you stand comfortably? 10-15 minutes.    How long can you walk comfortably? 10-15 minutes.    Diagnostic tests MRI (see under 'Media" tab).    Patient Stated Goals Get out of pain and do more without pain.    Currently in Pain? Yes    Pain Score 5  Pain Location Back    Pain Orientation Right;Left    Pain Descriptors / Indicators Aching    Pain Type Acute pain    Pain Onset More than a month ago                               [facility] Adult PT Treatment/Exercise - 12/27/20 0001       Modalities   Modalities Electrical Stimulation;Moist Heat;Ultrasound      Moist Heat Therapy   Number Minutes Moist Heat 15 Minutes    Moist Heat Location Lumbar Spine      Electrical Stimulation   Electrical Stimulation Location Bil LB    Electrical Stimulation Action IFC x15 mins    Electrical Stimulation Parameters 80-150hz     Electrical Stimulation Goals Tone;Pain      Ultrasound   Ultrasound Location Bil LB    Ultrasound Parameters Combo estim 1.5 w/cm2 x 12 mins    Ultrasound Goals Pain      Manual Therapy   Manual Therapy Soft tissue  mobilization    Soft tissue mobilization STW/M x 12 minutes to patient's bilateral lumbar musculature.                          PT Long Term Goals - 12/14/20 1453       PT LONG TERM GOAL #1   Title Independent with a HEP.    Time 6    Period Weeks    Status New      PT LONG TERM GOAL #2   Title Stand 20 minutes wiht LBP not > 3-4/10.    Time 6    Period Weeks    Status New      PT LONG TERM GOAL #3   Title Patient able to walk a community distance.    Time 6    Period Weeks    Status New      PT LONG TERM GOAL #4   Title No c/o left LE giving way.    Time 6    Period Weeks    Status New                   Plan - 12/27/20 1208     Clinical Impression Statement Pt arrived today doing about the same, but feels better after Rxs. Pt did well with Rx and did have reduced soreness /pain in TPs today notable during STW. Decreased pain end of session    Personal Factors and Comorbidities Comorbidity 1;Other    Comorbidities CAd (stents in place), right TKA, RA, CTR, HTN, wrist fusion. "OP" (per patient report).    Examination-Activity Limitations Other;Transfers;Locomotion Level;Stand    Examination-Participation Restrictions Other;Meal Prep    Stability/Clinical Decision Making Stable/Uncomplicated    Rehab Potential Excellent    PT Frequency 2x / week    PT Duration 6 weeks    PT Treatment/Interventions ADLs/Self Care Home Management;Cryotherapy;Electrical Stimulation;Ultrasound;Moist Heat;Functional mobility training;Therapeutic activities;Therapeutic exercise;Manual techniques;Patient/family education;Passive range of motion    PT Next Visit Plan Combo e'stim/US; STW/M, core exercise progression including back and ab machine.    Consulted and Agree with Plan of Care Patient             Patient will benefit from skilled therapeutic intervention in order to improve the following deficits and impairments:  Pain, Decreased activity tolerance,  Decreased mobility, Decreased range of motion, Increased muscle spasms  Visit Diagnosis: Chronic bilateral low back pain with left-sided sciatica     Problem List Patient Active Problem List   Diagnosis Date Noted   Hyperlipidemia LDL goal <70 10/27/2019   Progressive angina (HCC) 10/05/2019   Abnormal cardiac CT angiography 10/05/2019   CAD S/P percutaneous coronary angioplasty 10/05/2019   Feeling of chest tightness 09/04/2019   Depression 04/09/2019   Obese 09/03/2018   S/P right TKA 09/02/2018   PAC (premature atrial contraction) 07/11/2017   History of hypothyroidism 06/11/2017   Essential hypertension 06/11/2017   History of obesity 06/11/2017   S/P right knee arthroscopy 06/11/2017   DDD (degenerative disc disease), cervical 06/11/2017   DDD (degenerative disc disease), lumbar 06/11/2017   History of carpal tunnel surgery of right wrist 06/11/2017   Primary osteoarthritis of both knees 06/11/2017   Chest pain, precordial 05/05/2017   PVC's (premature ventricular contractions) 05/03/2017   Agatston coronary artery calcium score between 200 and 399 05/03/2017   Rheumatoid arthritis of multiple sites without rheumatoid factor (HCC) 10/10/2015   Uveitis 10/10/2015   Diverticulitis 10/10/2015   Dyspnea 12/05/2010   Palpitations 12/05/2010    Jerard Bays,CHRIS, PTA 12/27/2020, 12:58 PM  [facility] [address] [place], [place], [address] Phone: [phone]   Fax:  [phone]  Name: Kiara Miller MRN: [id number] Date of Birth: [birth year]

## 2021-01-06 ENCOUNTER — Other Ambulatory Visit: Payer: Self-pay

## 2021-01-06 ENCOUNTER — Ambulatory Visit: Payer: Medicare Other | Attending: Nurse Practitioner | Admitting: Physical Therapy

## 2021-01-06 ENCOUNTER — Encounter: Payer: Self-pay | Admitting: Physical Therapy

## 2021-01-06 DIAGNOSIS — G8929 Other chronic pain: Secondary | ICD-10-CM | POA: Diagnosis present

## 2021-01-06 DIAGNOSIS — M5442 Lumbago with sciatica, left side: Secondary | ICD-10-CM | POA: Diagnosis not present

## 2021-01-06 NOTE — Therapy (Signed)
[facility] [address] [place], [place], [address] Phone: [phone]   Fax:  [phone]  Physical Therapy Treatment  Patient Details  Name: Kiara Miller MRN: [id number] Date of Birth: 06/23/1950 Referring Provider (PT): Macario Carls   Encounter Date: 01/06/2021   PT End of Session - 01/06/21 1035     Visit Number 6    Number of Visits 12    Date for PT Re-Evaluation 01/25/21    PT Start Time 1040    PT Stop Time 1118    PT Time Calculation (min) 38 min    Activity Tolerance Patient tolerated treatment well    Behavior During Therapy [facility] for tasks assessed/performed             Past Medical History:  Diagnosis Date   CAD S/P percutaneous coronary angioplasty 10/05/2019   Cardiac cath revealed 85% mid LAD between SP1 and D1 (DES PCI with resolute Onyx DES 2.5 mm x 15 mm-negative for 8 mm); extensive moderate CT FFR and DFR negative proximal to mid RCA 60% and mid focal 45% as well as proximal to mid LCx 60% eccentric.  Both DFR was 0.97   Diverticulitis 05/2015   Hyperlipidemia    Hypertension    Joint pain    Osteoarthritis    Polyarthralgia    Rheumatoid arthritis (HCC)    Right knee pain     Past Surgical History:  Procedure Laterality Date   ABDOMINAL HYSTERECTOMY     CARPAL TUNNEL RELEASE     CORONARY STENT INTERVENTION N/A 10/05/2019   Procedure: CORONARY STENT INTERVENTION;  Surgeon: Marykay Lex, MD;  Location: [facility] INVASIVE CV LAB;  Service: Cardiovascular;  Laterality: N/A;; 85% mid LAD irregular lesion.  DES PCI reducing to 0% with resolute Onyx DES 2.5 mm x15 mm--2.8 mm.   KNEE ARTHROPLASTY     LEFT HEART CATH AND CORONARY ANGIOGRAPHY N/A 10/05/2019   Procedure: LEFT HEART CATH AND CORONARY ANGIOGRAPHY;  Surgeon: Marykay Lex, MD;  Location: [facility] INVASIVE CV LAB;  Service: Cardiovascular;: Mid LAD irregular 85% (DES PCI).  Extensive 60 to 65% proximal to mid RCA followed by 45% mid-distal RCA--DFR  negative (0.97).  Proximal-mid LCx 60% eccentric lesion-DFR 0.97.  (Positive for DFR is<0.9)   MANDIBLE SURGERY     NM MYOVIEW LTD  06/2017   (Ordered for coronary calcium score 376) LOW RISK.  EF 72%.  No ischemia or infarction.  No wall motion normality.  No change from previous.   OSTEOTOMY PELVIS BILATERAL     TONSILLECTOMY     TONSILLECTOMY     TOTAL KNEE ARTHROPLASTY Right 09/02/2018   Procedure: TOTAL KNEE ARTHROPLASTY;  Surgeon: Durene Romans, MD;  Location: [facility];  Service: Orthopedics;  Laterality: Right;  70 mins   TRANSTHORACIC ECHOCARDIOGRAM  12/2010   EF 65-70%. Mild LVH. No RWMA/ Gr1 DD.   TUBAL LIGATION     WRIST FUSION      There were no vitals filed for this visit.   Subjective Assessment - 01/06/21 1034     Subjective COVID-19 screen performed prior to patient entering clinic. Had two injections at L3-4 and L5-S1 on L side.    Pertinent History CAd (stents in place), right TKA, RA, CTR, HTN, wrist fusion. "OP" (per patient report).    How long can you stand comfortably? 10-15 minutes.    How long can you walk comfortably? 10-15 minutes.    Diagnostic tests MRI (see under 'Media" tab).  Patient Stated Goals Get out of pain and do more without pain.    Currently in Pain? Yes    Pain Score 4     Pain Location Back    Pain Orientation Left;Lower    Pain Descriptors / Indicators Discomfort    Pain Type Acute pain    Pain Onset More than a month ago    Pain Frequency Constant                [facility] PT Assessment - 01/06/21 0001       Assessment   Medical Diagnosis Lumbar degenerative disc disease.    Referring Provider (PT) Macario Carls      Precautions   Precaution Comments Supervised gait.      Restrictions   Weight Bearing Restrictions No                           [facility] Adult PT Treatment/Exercise - 01/06/21 0001       Modalities   Modalities Electrical Stimulation;Moist Heat;Ultrasound      Moist Heat Therapy   Number  Minutes Moist Heat 10 Minutes    Moist Heat Location Lumbar Spine      Electrical Stimulation   Electrical Stimulation Location Bil LB    Electrical Stimulation Action IFC    Electrical Stimulation Parameters 80-150 hz x10 min    Electrical Stimulation Goals Tone;Pain      Ultrasound   Ultrasound Location L lumbar paraspinals, QL    Ultrasound Parameters Combo 1.5 w/cm2, 100%,1 mhz x10 min    Ultrasound Goals Pain      Manual Therapy   Manual Therapy Soft tissue mobilization    Soft tissue mobilization STW to L lumbar paraspinals, QL to reduce tone and pain                          PT Long Term Goals - 12/14/20 1453       PT LONG TERM GOAL #1   Title Independent with a HEP.    Time 6    Period Weeks    Status New      PT LONG TERM GOAL #2   Title Stand 20 minutes wiht LBP not > 3-4/10.    Time 6    Period Weeks    Status New      PT LONG TERM GOAL #3   Title Patient able to walk a community distance.    Time 6    Period Weeks    Status New      PT LONG TERM GOAL #4   Title No c/o left LE giving way.    Time 6    Period Weeks    Status New                   Plan - 01/06/21 1115     Clinical Impression Statement Patient presented in clinic with reports of lower LBP but has not been very active this morning. Had injections of steroids this week as well. Still having pain with prolonged standing and has to lean forward. Moderate tone of the L lumbar paraspinals and QL region. Normal modalities response noted following removal of the modalities.    Personal Factors and Comorbidities Comorbidity 1;Other    Comorbidities CAd (stents in place), right TKA, RA, CTR, HTN, wrist fusion. "OP" (per patient report).    Examination-Activity Limitations Other;Transfers;Locomotion Level;Stand  Examination-Participation Restrictions Other;Meal Prep    Stability/Clinical Decision Making Stable/Uncomplicated    Rehab Potential Excellent    PT Frequency  2x / week    PT Duration 6 weeks    PT Treatment/Interventions ADLs/Self Care Home Management;Cryotherapy;Electrical Stimulation;Ultrasound;Moist Heat;Functional mobility training;Therapeutic activities;Therapeutic exercise;Manual techniques;Patient/family education;Passive range of motion    PT Next Visit Plan Combo e'stim/US; STW/M, core exercise progression including back and ab machine.    Consulted and Agree with Plan of Care Patient             Patient will benefit from skilled therapeutic intervention in order to improve the following deficits and impairments:  Pain, Decreased activity tolerance, Decreased mobility, Decreased range of motion, Increased muscle spasms  Visit Diagnosis: Chronic bilateral low back pain with left-sided sciatica     Problem List Patient Active Problem List   Diagnosis Date Noted   Hyperlipidemia LDL goal <70 10/27/2019   Progressive angina (HCC) 10/05/2019   Abnormal cardiac CT angiography 10/05/2019   CAD S/P percutaneous coronary angioplasty 10/05/2019   Feeling of chest tightness 09/04/2019   Depression 04/09/2019   Obese 09/03/2018   S/P right TKA 09/02/2018   PAC (premature atrial contraction) 07/11/2017   History of hypothyroidism 06/11/2017   Essential hypertension 06/11/2017   History of obesity 06/11/2017   S/P right knee arthroscopy 06/11/2017   DDD (degenerative disc disease), cervical 06/11/2017   DDD (degenerative disc disease), lumbar 06/11/2017   History of carpal tunnel surgery of right wrist 06/11/2017   Primary osteoarthritis of both knees 06/11/2017   Chest pain, precordial 05/05/2017   PVC's (premature ventricular contractions) 05/03/2017   Agatston coronary artery calcium score between 200 and 399 05/03/2017   Rheumatoid arthritis of multiple sites without rheumatoid factor (HCC) 10/10/2015   Uveitis 10/10/2015   Diverticulitis 10/10/2015   Dyspnea 12/05/2010   Palpitations 12/05/2010    Marvell Fuller,  PTA 01/06/2021, 11:26 AM  [facility] [address] [place], [place], [address] Phone: [phone]   Fax:  [phone]  Name: Kiara Miller MRN: [id number] Date of Birth: [birth year]

## 2021-01-10 ENCOUNTER — Ambulatory Visit: Payer: Medicare Other

## 2021-01-10 ENCOUNTER — Other Ambulatory Visit: Payer: Self-pay

## 2021-01-10 DIAGNOSIS — G8929 Other chronic pain: Secondary | ICD-10-CM

## 2021-01-10 DIAGNOSIS — M5442 Lumbago with sciatica, left side: Secondary | ICD-10-CM | POA: Diagnosis not present

## 2021-01-10 NOTE — Therapy (Signed)
[facility] [address] [place], [place], [address] Phone: [phone]   Fax:  [phone]  Physical Therapy Treatment  Patient Details  Name: Kiara Miller MRN: [id number] Date of Birth: 24-Nov-1950 Referring Provider (PT): Macario Carls   Encounter Date: 01/10/2021   PT End of Session - 01/10/21 1322     Visit Number 7    Number of Visits 12    Date for PT Re-Evaluation 01/25/21    PT Start Time 1300    PT Stop Time 1345    PT Time Calculation (min) 45 min    Activity Tolerance Patient tolerated treatment well    Behavior During Therapy [facility] for tasks assessed/performed             Past Medical History:  Diagnosis Date   CAD S/P percutaneous coronary angioplasty 10/05/2019   Cardiac cath revealed 85% mid LAD between SP1 and D1 (DES PCI with resolute Onyx DES 2.5 mm x 15 mm-negative for 8 mm); extensive moderate CT FFR and DFR negative proximal to mid RCA 60% and mid focal 45% as well as proximal to mid LCx 60% eccentric.  Both DFR was 0.97   Diverticulitis 05/2015   Hyperlipidemia    Hypertension    Joint pain    Osteoarthritis    Polyarthralgia    Rheumatoid arthritis (HCC)    Right knee pain     Past Surgical History:  Procedure Laterality Date   ABDOMINAL HYSTERECTOMY     CARPAL TUNNEL RELEASE     CORONARY STENT INTERVENTION N/A 10/05/2019   Procedure: CORONARY STENT INTERVENTION;  Surgeon: Marykay Lex, MD;  Location: [facility] INVASIVE CV LAB;  Service: Cardiovascular;  Laterality: N/A;; 85% mid LAD irregular lesion.  DES PCI reducing to 0% with resolute Onyx DES 2.5 mm x15 mm--2.8 mm.   KNEE ARTHROPLASTY     LEFT HEART CATH AND CORONARY ANGIOGRAPHY N/A 10/05/2019   Procedure: LEFT HEART CATH AND CORONARY ANGIOGRAPHY;  Surgeon: Marykay Lex, MD;  Location: [facility] INVASIVE CV LAB;  Service: Cardiovascular;: Mid LAD irregular 85% (DES PCI).  Extensive 60 to 65% proximal to mid RCA followed by 45% mid-distal RCA--DFR  negative (0.97).  Proximal-mid LCx 60% eccentric lesion-DFR 0.97.  (Positive for DFR is<0.9)   MANDIBLE SURGERY     NM MYOVIEW LTD  06/2017   (Ordered for coronary calcium score 376) LOW RISK.  EF 72%.  No ischemia or infarction.  No wall motion normality.  No change from previous.   OSTEOTOMY PELVIS BILATERAL     TONSILLECTOMY     TONSILLECTOMY     TOTAL KNEE ARTHROPLASTY Right 09/02/2018   Procedure: TOTAL KNEE ARTHROPLASTY;  Surgeon: Durene Romans, MD;  Location: [facility];  Service: Orthopedics;  Laterality: Right;  70 mins   TRANSTHORACIC ECHOCARDIOGRAM  12/2010   EF 65-70%. Mild LVH. No RWMA/ Gr1 DD.   TUBAL LIGATION     WRIST FUSION      There were no vitals filed for this visit.   Subjective Assessment - 01/10/21 1301     Subjective COVID-19 screen performed prior to patient entering clinic. Patient reports that her she was able to walk around for about 45 minutes shopping today without her back bothering her. However, she did a lot of standing yesterday which really bothered her back. She notes that her legs still feel really weak a few times per day.    Pertinent History CAd (stents in place), right TKA, RA, CTR, HTN, wrist fusion. "  OP" (per patient report).    How long can you stand comfortably? 10-15 minutes.    How long can you walk comfortably? 10-15 minutes.    Diagnostic tests MRI (see under 'Media" tab).    Patient Stated Goals Get out of pain and do more without pain.    Currently in Pain? Yes    Pain Score 4     Pain Location Back    Pain Type Chronic pain    Pain Onset More than a month ago                               [facility] Adult PT Treatment/Exercise - 01/10/21 0001       Exercises   Exercises Lumbar      Lumbar Exercises: Supine   Clam Other (comment)   2 minutes   Clam Limitations red t-band around knees    Bridge Non-compliant;Other (comment)   2  minutes   Straight Leg Raise 20 reps   both LE     Manual Therapy   Manual Therapy  Joint mobilization;Soft tissue mobilization    Joint Mobilization Lumbar grade I-III    Soft tissue mobilization to bilateral lumbar paraspinals                 Balance Exercises - 01/10/21 0001       Balance Exercises: Standing   Standing Eyes Opened Narrow base of support (BOS);Foam/compliant surface;2 reps;4 reps;30 secs    Tandem Stance Eyes open;Upper extremity support 1;Foam/compliant surface;4 reps;30 secs    Sidestepping Foam/compliant support;Upper extremity support;Other reps (comment)   3 minutes                    PT Long Term Goals - 12/14/20 1453       PT LONG TERM GOAL #1   Title Independent with a HEP.    Time 6    Period Weeks    Status New      PT LONG TERM GOAL #2   Title Stand 20 minutes wiht LBP not > 3-4/10.    Time 6    Period Weeks    Status New      PT LONG TERM GOAL #3   Title Patient able to walk a community distance.    Time 6    Period Weeks    Status New      PT LONG TERM GOAL #4   Title No c/o left LE giving way.    Time 6    Period Weeks    Status New                   Plan - 01/10/21 1324     Clinical Impression Statement Patient was progressed with multiple new interventions for improved lumbar and lower extremity strength with moderate difficulty. She required minimal cuing with bridges for gluteal engagement to facilitate hip extension. She reported no increase in lumbar pain or discomfort with any of today's interventions. She was able to be progressed from rhomberg stance on the foam pad to tandem stance. However, she required fingertip assistance on the parallel bars. She reported that her back felt alright upon the conclusion of treatment. She continues to require skilled physical therapy to address her remaining impairments to return to her prior level of function.    Personal Factors and Comorbidities Comorbidity 1;Other    Comorbidities CAd (stents in place), right TKA,  RA, CTR, HTN, wrist fusion.  "OP" (per patient report).    Examination-Activity Limitations Other;Transfers;Locomotion Level;Stand    Examination-Participation Restrictions Other;Meal Prep    Stability/Clinical Decision Making Stable/Uncomplicated    Rehab Potential Excellent    PT Frequency 2x / week    PT Duration 6 weeks    PT Treatment/Interventions ADLs/Self Care Home Management;Cryotherapy;Electrical Stimulation;Ultrasound;Moist Heat;Functional mobility training;Therapeutic activities;Therapeutic exercise;Manual techniques;Patient/family education;Passive range of motion    PT Next Visit Plan Combo e'stim/US; STW/M, core exercise progression including back and ab machine.    Consulted and Agree with Plan of Care Patient             Patient will benefit from skilled therapeutic intervention in order to improve the following deficits and impairments:  Pain, Decreased activity tolerance, Decreased mobility, Decreased range of motion, Increased muscle spasms  Visit Diagnosis: Chronic bilateral low back pain with left-sided sciatica     Problem List Patient Active Problem List   Diagnosis Date Noted   Hyperlipidemia LDL goal <70 10/27/2019   Progressive angina (HCC) 10/05/2019   Abnormal cardiac CT angiography 10/05/2019   CAD S/P percutaneous coronary angioplasty 10/05/2019   Feeling of chest tightness 09/04/2019   Depression 04/09/2019   Obese 09/03/2018   S/P right TKA 09/02/2018   PAC (premature atrial contraction) 07/11/2017   History of hypothyroidism 06/11/2017   Essential hypertension 06/11/2017   History of obesity 06/11/2017   S/P right knee arthroscopy 06/11/2017   DDD (degenerative disc disease), cervical 06/11/2017   DDD (degenerative disc disease), lumbar 06/11/2017   History of carpal tunnel surgery of right wrist 06/11/2017   Primary osteoarthritis of both knees 06/11/2017   Chest pain, precordial 05/05/2017   PVC's (premature ventricular contractions) 05/03/2017   Agatston  coronary artery calcium score between 200 and 399 05/03/2017   Rheumatoid arthritis of multiple sites without rheumatoid factor (HCC) 10/10/2015   Uveitis 10/10/2015   Diverticulitis 10/10/2015   Dyspnea 12/05/2010   Palpitations 12/05/2010    Granville Lewis, PT 01/10/2021, 2:00 PM  [facility] [address] [place], [place], [address] Phone: [phone]   Fax:  [phone]  Name: KATRENA STEHLIN MRN: [id number] Date of Birth: Mar 11, 1950

## 2021-01-12 ENCOUNTER — Ambulatory Visit: Payer: Medicare Other | Admitting: *Deleted

## 2021-01-12 ENCOUNTER — Other Ambulatory Visit: Payer: Self-pay

## 2021-01-12 DIAGNOSIS — G8929 Other chronic pain: Secondary | ICD-10-CM

## 2021-01-12 DIAGNOSIS — M5442 Lumbago with sciatica, left side: Secondary | ICD-10-CM | POA: Diagnosis not present

## 2021-01-12 NOTE — Therapy (Signed)
[facility] [address] [place], [place], [address] Phone: [phone]   Fax:  [phone]  Physical Therapy Treatment  Patient Details  Name: Kiara Miller MRN: [id number] Date of Birth: Dec 21, 1950 Referring Provider (PT): Macario Carls   Encounter Date: 01/12/2021   PT End of Session - 01/12/21 1412     Visit Number 8    Number of Visits 12    Date for PT Re-Evaluation 01/25/21    PT Start Time 1115    PT Stop Time 1205    PT Time Calculation (min) 50 min             Past Medical History:  Diagnosis Date   CAD S/P percutaneous coronary angioplasty 10/05/2019   Cardiac cath revealed 85% mid LAD between SP1 and D1 (DES PCI with resolute Onyx DES 2.5 mm x 15 mm-negative for 8 mm); extensive moderate CT FFR and DFR negative proximal to mid RCA 60% and mid focal 45% as well as proximal to mid LCx 60% eccentric.  Both DFR was 0.97   Diverticulitis 05/2015   Hyperlipidemia    Hypertension    Joint pain    Osteoarthritis    Polyarthralgia    Rheumatoid arthritis (HCC)    Right knee pain     Past Surgical History:  Procedure Laterality Date   ABDOMINAL HYSTERECTOMY     CARPAL TUNNEL RELEASE     CORONARY STENT INTERVENTION N/A 10/05/2019   Procedure: CORONARY STENT INTERVENTION;  Surgeon: Marykay Lex, MD;  Location: [facility] INVASIVE CV LAB;  Service: Cardiovascular;  Laterality: N/A;; 85% mid LAD irregular lesion.  DES PCI reducing to 0% with resolute Onyx DES 2.5 mm x15 mm--2.8 mm.   KNEE ARTHROPLASTY     LEFT HEART CATH AND CORONARY ANGIOGRAPHY N/A 10/05/2019   Procedure: LEFT HEART CATH AND CORONARY ANGIOGRAPHY;  Surgeon: Marykay Lex, MD;  Location: [facility] INVASIVE CV LAB;  Service: Cardiovascular;: Mid LAD irregular 85% (DES PCI).  Extensive 60 to 65% proximal to mid RCA followed by 45% mid-distal RCA--DFR negative (0.97).  Proximal-mid LCx 60% eccentric lesion-DFR 0.97.  (Positive for DFR is<0.9)   MANDIBLE SURGERY     NM  MYOVIEW LTD  06/2017   (Ordered for coronary calcium score 376) LOW RISK.  EF 72%.  No ischemia or infarction.  No wall motion normality.  No change from previous.   OSTEOTOMY PELVIS BILATERAL     TONSILLECTOMY     TONSILLECTOMY     TOTAL KNEE ARTHROPLASTY Right 09/02/2018   Procedure: TOTAL KNEE ARTHROPLASTY;  Surgeon: Durene Romans, MD;  Location: [facility];  Service: Orthopedics;  Laterality: Right;  70 mins   TRANSTHORACIC ECHOCARDIOGRAM  12/2010   EF 65-70%. Mild LVH. No RWMA/ Gr1 DD.   TUBAL LIGATION     WRIST FUSION      There were no vitals filed for this visit.   Subjective Assessment - 01/12/21 1116     Subjective COVID-19 screen performed prior to patient entering clinic. Patient reports that her pain LB 4/10    Pertinent History CAd (stents in place), right TKA, RA, CTR, HTN, wrist fusion. "OP" (per patient report).    How long can you stand comfortably? 10-15 minutes.    How long can you walk comfortably? 10-15 minutes.    Patient Stated Goals Get out of pain and do more without pain.    Currently in Pain? Yes    Pain Score 4     Pain Location  Back    Pain Orientation Left;Lower    Pain Descriptors / Indicators Discomfort    Pain Type Chronic pain                               [facility] Adult PT Treatment/Exercise - 01/12/21 0001       Exercises   Exercises Lumbar      Lumbar Exercises: Standing   Other Standing Lumbar Exercises Modified bird dog arm raise and then leg raise x 6 each hold 5 secs      Lumbar Exercises: Supine   Clam Other (comment)   2 minutes   Clam Limitations red t-band around knees    Bridge 20 reps;5 seconds    Straight Leg Raise 20 reps   both LE                Balance Exercises - 01/12/21 0001       Balance Exercises: Standing   Standing Eyes Opened Narrow base of support (BOS);Foam/compliant surface;2 reps;4 reps;30 secs    Tandem Stance Eyes open;Upper extremity support 1;Foam/compliant surface;30 secs;5  reps    SLS Eyes open;5 reps   with Toe touch on opposite foot                    PT Long Term Goals - 12/14/20 1453       PT LONG TERM GOAL #1   Title Independent with a HEP.    Time 6    Period Weeks    Status New      PT LONG TERM GOAL #2   Title Stand 20 minutes wiht LBP not > 3-4/10.    Time 6    Period Weeks    Status New      PT LONG TERM GOAL #3   Title Patient able to walk a community distance.    Time 6    Period Weeks    Status New      PT LONG TERM GOAL #4   Title No c/o left LE giving way.    Time 6    Period Weeks    Status New                   Plan - 01/12/21 1118     Clinical Impression Statement Pt arrived today doing fairly well with minimal LBP at the time , but increases with ADLs. Rx focused on core activation/ strengthening exs as well as balsnce. . She did well with supine exs and and standing modified bird-dog was added with arm and then  leg raise at counter.    Personal Factors and Comorbidities Comorbidity 1;Other    Comorbidities CAd (stents in place), right TKA, RA, CTR, HTN, wrist fusion. "OP" (per patient report).    Examination-Activity Limitations Other;Transfers;Locomotion Level;Stand    Stability/Clinical Decision Making Stable/Uncomplicated    Rehab Potential Excellent    PT Frequency 2x / week    PT Duration 6 weeks    PT Treatment/Interventions ADLs/Self Care Home Management;Cryotherapy;Electrical Stimulation;Ultrasound;Moist Heat;Functional mobility training;Therapeutic activities;Therapeutic exercise;Manual techniques;Patient/family education;Passive range of motion    PT Next Visit Plan Combo e'stim/US; STW/M, core exercise progression including back and ab machine.    Consulted and Agree with Plan of Care Patient             Patient will benefit from skilled therapeutic intervention in order to improve the following deficits and impairments:  Pain, Decreased activity tolerance, Decreased mobility,  Decreased range of motion, Increased muscle spasms  Visit Diagnosis: Chronic bilateral low back pain with left-sided sciatica     Problem List Patient Active Problem List   Diagnosis Date Noted   Hyperlipidemia LDL goal <70 10/27/2019   Progressive angina (HCC) 10/05/2019   Abnormal cardiac CT angiography 10/05/2019   CAD S/P percutaneous coronary angioplasty 10/05/2019   Feeling of chest tightness 09/04/2019   Depression 04/09/2019   Obese 09/03/2018   S/P right TKA 09/02/2018   PAC (premature atrial contraction) 07/11/2017   History of hypothyroidism 06/11/2017   Essential hypertension 06/11/2017   History of obesity 06/11/2017   S/P right knee arthroscopy 06/11/2017   DDD (degenerative disc disease), cervical 06/11/2017   DDD (degenerative disc disease), lumbar 06/11/2017   History of carpal tunnel surgery of right wrist 06/11/2017   Primary osteoarthritis of both knees 06/11/2017   Chest pain, precordial 05/05/2017   PVC's (premature ventricular contractions) 05/03/2017   Agatston coronary artery calcium score between 200 and 399 05/03/2017   Rheumatoid arthritis of multiple sites without rheumatoid factor (HCC) 10/10/2015   Uveitis 10/10/2015   Diverticulitis 10/10/2015   Dyspnea 12/05/2010   Palpitations 12/05/2010    Tanina Barb,CHRIS, PTA 01/12/2021, 2:24 PM  [facility] [address] [place], [place], [address] Phone: [phone]   Fax:  [phone]  Name: Kiara Miller MRN: [id number] Date of Birth: 1950/03/31

## 2021-01-16 ENCOUNTER — Encounter: Payer: Self-pay | Admitting: Physical Therapy

## 2021-01-16 ENCOUNTER — Ambulatory Visit: Payer: Medicare Other | Admitting: Physical Therapy

## 2021-01-16 ENCOUNTER — Other Ambulatory Visit: Payer: Self-pay

## 2021-01-16 DIAGNOSIS — M5442 Lumbago with sciatica, left side: Secondary | ICD-10-CM | POA: Diagnosis not present

## 2021-01-16 DIAGNOSIS — G8929 Other chronic pain: Secondary | ICD-10-CM

## 2021-01-16 NOTE — Therapy (Signed)
[facility] [address] [place], [place], [address] Phone: [phone]   Fax:  [phone]  Physical Therapy Treatment  Patient Details  Name: Kiara Miller MRN: [id number] Date of Birth: 1950/08/31 Referring Provider (PT): Macario Carls   Encounter Date: 01/16/2021   PT End of Session - 01/16/21 1348     Visit Number 9    Number of Visits 12    Date for PT Re-Evaluation 01/25/21    PT Start Time 1348    PT Stop Time 1418    PT Time Calculation (min) 30 min    Activity Tolerance Patient tolerated treatment well    Behavior During Therapy [facility] for tasks assessed/performed             Past Medical History:  Diagnosis Date   CAD S/P percutaneous coronary angioplasty 10/05/2019   Cardiac cath revealed 85% mid LAD between SP1 and D1 (DES PCI with resolute Onyx DES 2.5 mm x 15 mm-negative for 8 mm); extensive moderate CT FFR and DFR negative proximal to mid RCA 60% and mid focal 45% as well as proximal to mid LCx 60% eccentric.  Both DFR was 0.97   Diverticulitis 05/2015   Hyperlipidemia    Hypertension    Joint pain    Osteoarthritis    Polyarthralgia    Rheumatoid arthritis (HCC)    Right knee pain     Past Surgical History:  Procedure Laterality Date   ABDOMINAL HYSTERECTOMY     CARPAL TUNNEL RELEASE     CORONARY STENT INTERVENTION N/A 10/05/2019   Procedure: CORONARY STENT INTERVENTION;  Surgeon: Marykay Lex, MD;  Location: [facility] INVASIVE CV LAB;  Service: Cardiovascular;  Laterality: N/A;; 85% mid LAD irregular lesion.  DES PCI reducing to 0% with resolute Onyx DES 2.5 mm x15 mm--2.8 mm.   KNEE ARTHROPLASTY     LEFT HEART CATH AND CORONARY ANGIOGRAPHY N/A 10/05/2019   Procedure: LEFT HEART CATH AND CORONARY ANGIOGRAPHY;  Surgeon: Marykay Lex, MD;  Location: [facility] INVASIVE CV LAB;  Service: Cardiovascular;: Mid LAD irregular 85% (DES PCI).  Extensive 60 to 65% proximal to mid RCA followed by 45% mid-distal RCA--DFR  negative (0.97).  Proximal-mid LCx 60% eccentric lesion-DFR 0.97.  (Positive for DFR is<0.9)   MANDIBLE SURGERY     NM MYOVIEW LTD  06/2017   (Ordered for coronary calcium score 376) LOW RISK.  EF 72%.  No ischemia or infarction.  No wall motion normality.  No change from previous.   OSTEOTOMY PELVIS BILATERAL     TONSILLECTOMY     TONSILLECTOMY     TOTAL KNEE ARTHROPLASTY Right 09/02/2018   Procedure: TOTAL KNEE ARTHROPLASTY;  Surgeon: Durene Romans, MD;  Location: [facility];  Service: Orthopedics;  Laterality: Right;  70 mins   TRANSTHORACIC ECHOCARDIOGRAM  12/2010   EF 65-70%. Mild LVH. No RWMA/ Gr1 DD.   TUBAL LIGATION     WRIST FUSION      There were no vitals filed for this visit.   Subjective Assessment - 01/16/21 1347     Subjective COVID-19 screen performed prior to patient entering clinic. Had a wild morning.    Pertinent History CAd (stents in place), right TKA, RA, CTR, HTN, wrist fusion. "OP" (per patient report).    How long can you stand comfortably? 10-15 minutes.    How long can you walk comfortably? 10-15 minutes.    Diagnostic tests MRI (see under 'Media" tab).    Patient Stated Goals Get out  of pain and do more without pain.    Currently in Pain? Yes    Pain Score 3     Pain Location Back    Pain Orientation Lower    Pain Descriptors / Indicators Discomfort    Pain Type Chronic pain    Pain Onset More than a month ago    Pain Frequency Constant                [facility] PT Assessment - 01/16/21 0001       Assessment   Medical Diagnosis Lumbar degenerative disc disease.    Referring Provider (PT) Macario Carls      Precautions   Precaution Comments Supervised gait.                           [facility] Adult PT Treatment/Exercise - 01/16/21 0001       Lumbar Exercises: Standing   Other Standing Lumbar Exercises Modified bird dog arm raise and then leg raise x 6 each hold 5 secs                          PT Long Term Goals  - 12/14/20 1453       PT LONG TERM GOAL #1   Title Independent with a HEP.    Time 6    Period Weeks    Status New      PT LONG TERM GOAL #2   Title Stand 20 minutes wiht LBP not > 3-4/10.    Time 6    Period Weeks    Status New      PT LONG TERM GOAL #3   Title Patient able to walk a community distance.    Time 6    Period Weeks    Status New      PT LONG TERM GOAL #4   Title No c/o left LE giving way.    Time 6    Period Weeks    Status New                   Plan - 01/16/21 1452     Clinical Impression Statement Patient presented in clinic with reports of minimal LBP. Patient unable to finish her HEP this morning as an emergency came up. Patient progressed to more functional and active core and lumbar training. Patient introduced to hinge method for sit <> stands and squats. No complaints of any increased LBP with activity. No modalities completed.    Personal Factors and Comorbidities Comorbidity 1;Other    Comorbidities CAd (stents in place), right TKA, RA, CTR, HTN, wrist fusion. "OP" (per patient report).    Examination-Activity Limitations Other;Transfers;Locomotion Level;Stand    Examination-Participation Restrictions Other;Meal Prep    Stability/Clinical Decision Making Stable/Uncomplicated    Rehab Potential Excellent    PT Frequency 2x / week    PT Duration 6 weeks    PT Treatment/Interventions ADLs/Self Care Home Management;Cryotherapy;Electrical Stimulation;Ultrasound;Moist Heat;Functional mobility training;Therapeutic activities;Therapeutic exercise;Manual techniques;Patient/family education;Passive range of motion    PT Next Visit Plan Combo e'stim/US; STW/M, core exercise progression including back and ab machine.    Consulted and Agree with Plan of Care Patient             Patient will benefit from skilled therapeutic intervention in order to improve the following deficits and impairments:  Pain, Decreased activity tolerance, Decreased  mobility, Decreased range of motion, Increased muscle spasms  Visit Diagnosis: Chronic bilateral low back pain with left-sided sciatica     Problem List Patient Active Problem List   Diagnosis Date Noted   Hyperlipidemia LDL goal <70 10/27/2019   Progressive angina (HCC) 10/05/2019   Abnormal cardiac CT angiography 10/05/2019   CAD S/P percutaneous coronary angioplasty 10/05/2019   Feeling of chest tightness 09/04/2019   Depression 04/09/2019   Obese 09/03/2018   S/P right TKA 09/02/2018   PAC (premature atrial contraction) 07/11/2017   History of hypothyroidism 06/11/2017   Essential hypertension 06/11/2017   History of obesity 06/11/2017   S/P right knee arthroscopy 06/11/2017   DDD (degenerative disc disease), cervical 06/11/2017   DDD (degenerative disc disease), lumbar 06/11/2017   History of carpal tunnel surgery of right wrist 06/11/2017   Primary osteoarthritis of both knees 06/11/2017   Chest pain, precordial 05/05/2017   PVC's (premature ventricular contractions) 05/03/2017   Agatston coronary artery calcium score between 200 and 399 05/03/2017   Rheumatoid arthritis of multiple sites without rheumatoid factor (HCC) 10/10/2015   Uveitis 10/10/2015   Diverticulitis 10/10/2015   Dyspnea 12/05/2010   Palpitations 12/05/2010    Marvell Fuller, PTA 01/16/2021, 2:56 PM  [facility] [address] [place], [place], [address] Phone: [phone]   Fax:  [phone]  Name: Kiara Miller MRN: [id number] Date of Birth: 04/02/1950

## 2021-01-19 ENCOUNTER — Ambulatory Visit: Payer: Medicare Other | Admitting: *Deleted

## 2021-01-19 ENCOUNTER — Other Ambulatory Visit: Payer: Self-pay

## 2021-01-19 DIAGNOSIS — M5442 Lumbago with sciatica, left side: Secondary | ICD-10-CM | POA: Diagnosis not present

## 2021-01-19 NOTE — Therapy (Addendum)
[facility] [place], [place], [address] Phone: [phone]   Fax:  [phone]  Physical Therapy Treatment  Patient Details  Name: Kiara Miller MRN: [id number] Date of Birth: 1951-01-26 Referring Provider (PT): [facility]   Encounter Date: 01/19/2021   PT End of Session - 01/19/21 1741     Visit Number 10    Number of Visits 12    Date for PT Re-Evaluation 01/25/21    PT Start Time 1300    PT Stop Time 3382    PT Time Calculation (min) 47 min             Past Medical History:  Diagnosis Date   CAD S/P percutaneous coronary angioplasty 10/05/2019   Cardiac cath revealed 85% mid LAD between SP1 and D1 (DES PCI with resolute Onyx DES 2.5 mm x 15 mm-negative for 8 mm); extensive moderate CT FFR and DFR negative proximal to mid RCA 60% and mid focal 45% as well as proximal to mid LCx 60% eccentric.  Both DFR was 0.97   Diverticulitis 05/2015   Hyperlipidemia    Hypertension    Joint pain    Osteoarthritis    Polyarthralgia    Rheumatoid arthritis ([place])    Right knee pain     Past Surgical History:  Procedure Laterality Date   ABDOMINAL HYSTERECTOMY     CARPAL TUNNEL RELEASE     CORONARY STENT INTERVENTION N/A 10/05/2019   Procedure: CORONARY STENT INTERVENTION;  Surgeon: Leonie Man, MD;  Location: [facility];  Service: Cardiovascular;  Laterality: N/A;; 85% mid LAD irregular lesion.  DES PCI reducing to 0% with resolute Onyx DES 2.5 mm x15 mm--2.8 mm.   KNEE ARTHROPLASTY     LEFT HEART CATH AND CORONARY ANGIOGRAPHY N/A 10/05/2019   Procedure: LEFT HEART CATH AND CORONARY ANGIOGRAPHY;  Surgeon: Leonie Man, MD;  Location: [facility];  Service: Cardiovascular;: Mid LAD irregular 85% (DES PCI).  Extensive 60 to 65% proximal to mid RCA followed by 45% mid-distal RCA--DFR negative (0.97).  Proximal-mid LCx 60% eccentric lesion-DFR 0.97.  (Positive for DFR is<0.9)   MANDIBLE SURGERY      NM MYOVIEW LTD  06/2017   (Ordered for coronary calcium score 376) LOW RISK.  EF 72%.  No ischemia or infarction.  No wall motion normality.  No change from previous.   OSTEOTOMY PELVIS BILATERAL     TONSILLECTOMY     TONSILLECTOMY     TOTAL KNEE ARTHROPLASTY Right 09/02/2018   Procedure: TOTAL KNEE ARTHROPLASTY;  Surgeon: Paralee Cancel, MD;  Location: [facility];  Service: Orthopedics;  Laterality: Right;  70 mins   TRANSTHORACIC ECHOCARDIOGRAM  12/2010   EF 65-70%. Mild LVH. No RWMA/ Gr1 DD.   TUBAL LIGATION     WRIST FUSION      There were no vitals filed for this visit.   Subjective Assessment - 01/19/21 1331     Subjective COVID-19 screen performed prior to patient entering clinic. Doing  my exs at home. Pain still with ADLs    Pertinent History CAd (stents in place), right TKA, RA, CTR, HTN, wrist fusion. "OP" (per patient report).    How long can you stand comfortably? 10-15 minutes.    How long can you walk comfortably? 10-15 minutes.    Diagnostic tests MRI (see under 'Media" tab).    Patient Stated Goals Get out of pain and do more without pain.    Currently in Pain?  Yes    Pain Location Back    Pain Orientation Lower    Pain Descriptors / Indicators Discomfort    Pain Type Chronic pain    Pain Onset More than a month ago    Pain Frequency Intermittent                               [facility] Adult PT Treatment/Exercise - 01/19/21 0001       Self-Care   Self-Care ADL's      Therapeutic Activites    Therapeutic Activities ADL's;Lifting      Exercises   Exercises Lumbar      Lumbar Exercises: Stretches   Other Lumbar Stretch Exercise Reviewed/ discussed  current HEP      Modalities   Modalities Electrical Stimulation;Moist Heat;Ultrasound      Moist Heat Therapy   Number Minutes Moist Heat 15 Minutes    Moist Heat Location Lumbar Spine      Electrical Stimulation   Electrical Stimulation Location LB    Electrical Stimulation Action  microcurrent    Electrical Stimulation Parameters 40/710, 40/157 x 15 mins 100 microamps    Electrical Stimulation Goals Pain                          PT Long Term Goals - 12/14/20 1453       PT LONG TERM GOAL #1   Title Independent with a HEP.    Time 6    Period Weeks    Status New      PT LONG TERM GOAL #2   Title Stand 20 minutes wiht LBP not > 3-4/10.    Time 6    Period Weeks    Status New      PT LONG TERM GOAL #3   Title Patient able to walk a community distance.    Time 6    Period Weeks    Status New      PT LONG TERM GOAL #4   Title No c/o left LE giving way.    Time 6    Period Weeks    Status New                   Plan - 01/19/21 1337     Clinical Impression Statement Pt arrived today doing about the same and reports performing HEP. HEP reviewed/ discussed f/b microcurrent 40/710 and 40/157 x 15 mins each in sitting with HMP to LB    Personal Factors and Comorbidities Comorbidity 1;Other    Comorbidities CAd (stents in place), right TKA, RA, CTR, HTN, wrist fusion. "OP" (per patient report).    Examination-Participation Restrictions Other;Meal Prep    Stability/Clinical Decision Making Stable/Uncomplicated    Rehab Potential Excellent    PT Frequency 2x / week    PT Duration 6 weeks    PT Treatment/Interventions ADLs/Self Care Home Management;Cryotherapy;Electrical Stimulation;Ultrasound;Moist Heat;Functional mobility training;Therapeutic activities;Therapeutic exercise;Manual techniques;Patient/family education;Passive range of motion    PT Next Visit Plan Combo e'stim/US; STW/M, core exercise progression including back and ab machine.    Consulted and Agree with Plan of Care Patient             Patient will benefit from skilled therapeutic intervention in order to improve the following deficits and impairments:  Pain, Decreased activity tolerance, Decreased mobility, Decreased range of motion, Increased muscle  spasms  Visit Diagnosis: Chronic bilateral low  back pain with left-sided sciatica     Problem List Patient Active Problem List   Diagnosis Date Noted   Hyperlipidemia LDL goal <70 10/27/2019   Progressive angina ([place]) 10/05/2019   Abnormal cardiac CT angiography 10/05/2019   CAD S/P percutaneous coronary angioplasty 10/05/2019   Feeling of chest tightness 09/04/2019   Depression 04/09/2019   Obese 09/03/2018   S/P right TKA 09/02/2018   PAC (premature atrial contraction) 07/11/2017   History of hypothyroidism 06/11/2017   Essential hypertension 06/11/2017   History of obesity 06/11/2017   S/P right knee arthroscopy 06/11/2017   DDD (degenerative disc disease), cervical 06/11/2017   DDD (degenerative disc disease), lumbar 06/11/2017   History of carpal tunnel surgery of right wrist 06/11/2017   Primary osteoarthritis of both knees 06/11/2017   Chest pain, precordial 05/05/2017   PVC's (premature ventricular contractions) 05/03/2017   Agatston coronary artery calcium score between 200 and 399 05/03/2017   Rheumatoid arthritis of multiple sites without rheumatoid factor ([place]) 10/10/2015   Uveitis 10/10/2015   Diverticulitis 10/10/2015   Dyspnea 12/05/2010   Palpitations 12/05/2010    Kaine Mcquillen,CHRIS, PTA 01/19/2021, 5:47 PM  [facility] [place], [place], [address] Phone: [phone]   Fax:  [phone]  Name: SIMRAN BOMKAMP MRN: [id number] Date of Birth: Mar 09, 1950   PHYSICAL THERAPY DISCHARGE SUMMARY  Visits from Start of Care: 10.  Current functional level related to goals / functional outcomes: See above.   Remaining deficits: Continued pain.   Education / Equipment: HEP.   Patient agrees to discharge. Patient goals were not met. Patient is being discharged due to lack of progress.    Mali Applegate MPT

## 2021-01-22 ENCOUNTER — Encounter (HOSPITAL_BASED_OUTPATIENT_CLINIC_OR_DEPARTMENT_OTHER): Payer: Self-pay | Admitting: *Deleted

## 2021-01-22 ENCOUNTER — Emergency Department (HOSPITAL_BASED_OUTPATIENT_CLINIC_OR_DEPARTMENT_OTHER): Payer: Medicare Other

## 2021-01-22 ENCOUNTER — Other Ambulatory Visit: Payer: Self-pay

## 2021-01-22 ENCOUNTER — Emergency Department (HOSPITAL_BASED_OUTPATIENT_CLINIC_OR_DEPARTMENT_OTHER)
Admission: EM | Admit: 2021-01-22 | Discharge: 2021-01-22 | Disposition: A | Discharge status: Home / Self Care | Payer: Medicare Other | Attending: Emergency Medicine | Admitting: Emergency Medicine

## 2021-01-22 DIAGNOSIS — Z79899 Other long term (current) drug therapy: Secondary | ICD-10-CM | POA: Insufficient documentation

## 2021-01-22 DIAGNOSIS — W0110XA Fall on same level from slipping, tripping and stumbling with subsequent striking against unspecified object, initial encounter: Secondary | ICD-10-CM | POA: Diagnosis not present

## 2021-01-22 DIAGNOSIS — E039 Hypothyroidism, unspecified: Secondary | ICD-10-CM | POA: Insufficient documentation

## 2021-01-22 DIAGNOSIS — S0990XA Unspecified injury of head, initial encounter: Secondary | ICD-10-CM | POA: Diagnosis present

## 2021-01-22 DIAGNOSIS — Z23 Encounter for immunization: Secondary | ICD-10-CM | POA: Insufficient documentation

## 2021-01-22 DIAGNOSIS — Z87891 Personal history of nicotine dependence: Secondary | ICD-10-CM | POA: Insufficient documentation

## 2021-01-22 DIAGNOSIS — S6992XA Unspecified injury of left wrist, hand and finger(s), initial encounter: Secondary | ICD-10-CM | POA: Diagnosis present

## 2021-01-22 DIAGNOSIS — Z7902 Long term (current) use of antithrombotics/antiplatelets: Secondary | ICD-10-CM | POA: Diagnosis not present

## 2021-01-22 DIAGNOSIS — I2510 Atherosclerotic heart disease of native coronary artery without angina pectoris: Secondary | ICD-10-CM | POA: Diagnosis not present

## 2021-01-22 DIAGNOSIS — Z96651 Presence of right artificial knee joint: Secondary | ICD-10-CM | POA: Insufficient documentation

## 2021-01-22 DIAGNOSIS — I10 Essential (primary) hypertension: Secondary | ICD-10-CM | POA: Insufficient documentation

## 2021-01-22 DIAGNOSIS — S61412A Laceration without foreign body of left hand, initial encounter: Secondary | ICD-10-CM | POA: Insufficient documentation

## 2021-01-22 MED ORDER — FENTANYL CITRATE PF 50 MCG/ML IJ SOSY
50.0000 ug | PREFILLED_SYRINGE | Freq: Once | INTRAMUSCULAR | Status: AC
  Administered 2021-01-22: 20:00:00 50 ug via INTRAMUSCULAR
  Filled 2021-01-22: qty 1

## 2021-01-22 MED ORDER — FENTANYL CITRATE PF 50 MCG/ML IJ SOSY
50.0000 ug | PREFILLED_SYRINGE | Freq: Once | INTRAMUSCULAR | Status: DC
  Filled 2021-01-22: qty 1

## 2021-01-22 MED ORDER — TETANUS-DIPHTH-ACELL PERTUSSIS 5-2.5-18.5 LF-MCG/0.5 IM SUSY
0.5000 mL | PREFILLED_SYRINGE | Freq: Once | INTRAMUSCULAR | Status: AC
  Administered 2021-01-22: 21:00:00 0.5 mL via INTRAMUSCULAR
  Filled 2021-01-22: qty 0.5

## 2021-01-22 MED ORDER — LIDOCAINE-EPINEPHRINE (PF) 2 %-1:200000 IJ SOLN
10.0000 mL | Freq: Once | INTRAMUSCULAR | Status: AC
  Administered 2021-01-22: 10 mL via INTRADERMAL
  Filled 2021-01-22: qty 20

## 2021-01-22 NOTE — ED Provider Notes (Signed)
[facility] EMERGENCY DEPARTMENT Provider Note   CSN: [id number] Arrival date & time: 01/22/21  1805     History Chief Complaint  Patient presents with   Laceration    Kiara Miller is a 70 y.o. female with medical history significant for left wrist fusion, hypertension, hyperlipidemia, osteoarthritis, joint pain.  Patient presents to ED due to fall she suffered 2 hours ago with resulting laceration to left palm.  Patient states that she was in her garage when she lost her footing and fell down half of a step and cut her hand on a cabinet.  Patient denies loss of consciousness.  Patient is unsure if she hit her head and patient is currently being treated with Plavix.  Patient states that she might have "bumped "her head.   Laceration     Past Medical History:  Diagnosis Date   CAD S/P percutaneous coronary angioplasty 10/05/2019   Cardiac cath revealed 85% mid LAD between SP1 and D1 (DES PCI with resolute Onyx DES 2.5 mm x 15 mm-negative for 8 mm); extensive moderate CT FFR and DFR negative proximal to mid RCA 60% and mid focal 45% as well as proximal to mid LCx 60% eccentric.  Both DFR was 0.97   Diverticulitis 05/2015   Hyperlipidemia    Hypertension    Joint pain    Osteoarthritis    Polyarthralgia    Rheumatoid arthritis ([facility])    Right knee pain     Patient Active Problem List   Diagnosis Date Noted   Hyperlipidemia LDL goal <70 10/27/2019   Progressive angina ([facility]) 10/05/2019   Abnormal cardiac CT angiography 10/05/2019   CAD S/P percutaneous coronary angioplasty 10/05/2019   Feeling of chest tightness 09/04/2019   Depression 04/09/2019   Obese 09/03/2018   S/P right TKA 09/02/2018   PAC (premature atrial contraction) 07/11/2017   History of hypothyroidism 06/11/2017   Essential hypertension 06/11/2017   History of obesity 06/11/2017   S/P right knee arthroscopy 06/11/2017   DDD (degenerative disc disease), cervical 06/11/2017   DDD (degenerative  disc disease), lumbar 06/11/2017   History of carpal tunnel surgery of right wrist 06/11/2017   Primary osteoarthritis of both knees 06/11/2017   Chest pain, precordial 05/05/2017   PVC's (premature ventricular contractions) 05/03/2017   Agatston coronary artery calcium score between 200 and 399 05/03/2017   Rheumatoid arthritis of multiple sites without rheumatoid factor ([facility]) 10/10/2015   Uveitis 10/10/2015   Diverticulitis 10/10/2015   Dyspnea 12/05/2010   Palpitations 12/05/2010    Past Surgical History:  Procedure Laterality Date   ABDOMINAL HYSTERECTOMY     CARPAL TUNNEL RELEASE     CORONARY STENT INTERVENTION N/A 10/05/2019   Procedure: CORONARY STENT INTERVENTION;  Surgeon: Marykay Lex, MD;  Location: [facility];  Service: Cardiovascular;  Laterality: N/A;; 85% mid LAD irregular lesion.  DES PCI reducing to 0% with resolute Onyx DES 2.5 mm x15 mm--2.8 mm.   KNEE ARTHROPLASTY     LEFT HEART CATH AND CORONARY ANGIOGRAPHY N/A 10/05/2019   Procedure: LEFT HEART CATH AND CORONARY ANGIOGRAPHY;  Surgeon: Marykay Lex, MD;  Location: [facility];  Service: Cardiovascular;: Mid LAD irregular 85% (DES PCI).  Extensive 60 to 65% proximal to mid RCA followed by 45% mid-distal RCA--DFR negative (0.97).  Proximal-mid LCx 60% eccentric lesion-DFR 0.97.  (Positive for DFR is<0.9)   MANDIBLE SURGERY     NM MYOVIEW LTD  06/2017   (Ordered for coronary calcium score 376)  LOW RISK.  EF 72%.  No ischemia or infarction.  No wall motion normality.  No change from previous.   OSTEOTOMY PELVIS BILATERAL     TONSILLECTOMY     TONSILLECTOMY     TOTAL KNEE ARTHROPLASTY Right 09/02/2018   Procedure: TOTAL KNEE ARTHROPLASTY;  Surgeon: Durene Romans, MD;  Location: [facility];  Service: Orthopedics;  Laterality: Right;  70 mins   TRANSTHORACIC ECHOCARDIOGRAM  12/2010   EF 65-70%. Mild LVH. No RWMA/ Gr1 DD.   TUBAL LIGATION     WRIST FUSION       OB History   No obstetric history on  file.     Family History  Problem Relation Age of Onset   Hypertension Father    Lung disease Brother     Social History   Tobacco Use   Smoking status: Former    Packs/day: 0.50    Years: 48.00    Pack years: 24.00    Types: Cigarettes    Quit date: 08/18/2018    Years since quitting: 2.4   Smokeless tobacco: Never  Vaping Use   Vaping Use: Never used  Substance Use Topics   Alcohol use: No   Drug use: No    Home Medications Prior to Admission medications   Medication Sig Start Date End Date Taking? Authorizing Provider  cephALEXin (KEFLEX) 500 MG capsule Take 2,000 mg by mouth See admin instructions. Take 2000 mg 1 hour prior to dental work 01/23/19   [provider]  Cholecalciferol (VITAMIN D3) 1.25 MG (50000 UT) CAPS 1 capsule    [provider]  clopidogrel (PLAVIX) 75 MG tablet Take 1 tablet (75 mg total) by mouth daily. 07/18/20 07/18/21  Marykay Lex, MD  escitalopram (LEXAPRO) 20 MG tablet Take 1 tablet by mouth daily. 06/20/20   [provider]  ibuprofen (ADVIL) 200 MG tablet Take 800 mg by mouth 2 (two) times daily as needed for headache or moderate pain.    [provider]  ketoconazole (NIZORAL) 2 % cream Apply 1 application topically daily as needed for irritation.    [provider]  levothyroxine (SYNTHROID, LEVOTHROID) 75 MCG tablet Take 75 mcg by mouth daily before breakfast.    [provider]  losartan-hydrochlorothiazide (HYZAAR) 100-25 MG per tablet Take 1 tablet by mouth daily. 12/05/10   Nahser, Deloris Ping, MD  metoprolol succinate (TOPROL-XL) 25 MG 24 hr tablet Take 12.5 mg by mouth daily.    [provider]  montelukast (SINGULAIR) 10 MG tablet Take 10 mg by mouth at bedtime.    [provider]  nitroGLYCERIN (NITROSTAT) 0.4 MG SL tablet Place 1 tablet (0.4 mg total) under the tongue every 5 (five) minutes as needed for chest pain. Call EMS if CP is not relieved after 2 doses.  You may take up to 3 doses. 09/30/19 12/29/19  Marykay Lex, MD  PRALUENT 150 MG/ML SOAJ Inject 150 mg into the skin every 14 (fourteen) days. 10/11/20   Marykay Lex, MD    Allergies    Clindamycin/lincomycin, Codeine, Leflunomide, Penicillins, Statins, Tetracyclines & related, and Levofloxacin  Review of Systems   Review of Systems  Physical Exam Updated Vital Signs BP (!) 152/65    Pulse 70    Resp 20    SpO2 97%   Physical Exam Neurological:     General: No focal deficit present.     Mental Status: She is alert and oriented to person, place, and time.  GCS: GCS eye subscore is 4. GCS verbal subscore is 5. GCS motor subscore is 6.     Cranial Nerves: Cranial nerves 2-12 are intact.     Sensory: Sensation is intact.     Motor: Motor function is intact.     Coordination: Coordination is intact.     Gait: Gait is intact.    ED Results / Procedures / Treatments   Labs (all labs ordered are listed, but only abnormal results are displayed) Labs Reviewed - No data to display  EKG None  Radiology DG Wrist Complete Left  Result Date: 01/22/2021 CLINICAL DATA:  Status post fall. EXAM: LEFT WRIST - COMPLETE 3+ VIEW COMPARISON:  None. FINDINGS: There is no evidence of an acute fracture or dislocation. Radiopaque fixation pins are seen within the distal left radius. Chronic and degenerative changes are noted throughout the left wrist. Mild dorsal soft tissue swelling is seen. IMPRESSION: 1. No acute fracture or dislocation. 2. Chronic and degenerative changes within the distal left radius and throughout the left wrist. Electronically Signed   By: Aram Candela M.D.   On: 01/22/2021 19:37   CT Head Wo Contrast  Result Date: 01/22/2021 CLINICAL DATA:  Status post trauma. EXAM: CT HEAD WITHOUT CONTRAST TECHNIQUE: Contiguous axial images were obtained from the base of the skull through the vertex without intravenous contrast. COMPARISON:  None. FINDINGS: Brain: There is mild  cerebral atrophy with widening of the extra-axial spaces and ventricular dilatation. There are areas of decreased attenuation within the white matter tracts of the supratentorial brain, consistent with microvascular disease changes. Vascular: No hyperdense vessel or unexpected calcification. Skull: Normal. Negative for fracture or focal lesion. Sinuses/Orbits: No acute finding. Other: None. IMPRESSION: No acute intracranial pathology. Electronically Signed   By: Aram Candela M.D.   On: 01/22/2021 19:40   DG Hand 2 View Left  Result Date: 01/22/2021 CLINICAL DATA:  Status post trauma with subsequent left hand laceration. EXAM: LEFT HAND - 2 VIEW COMPARISON:  None. FINDINGS: There is no evidence of an acute fracture or dislocation. Radiopaque fixation pins are seen within the distal left radius. Mild chronic and degenerative changes are seen involving the left wrist. Mild dorsal soft tissue swelling is noted. IMPRESSION: Chronic and degenerative changes, without evidence of an acute osseous abnormality. Electronically Signed   By: Aram Candela M.D.   On: 01/22/2021 19:36    Procedures .Marland KitchenLaceration Repair  Date/Time: 01/22/2021 8:59 PM Performed by: Al Decant, PA-C Authorized by: Al Decant, PA-C   Consent:    Consent obtained:  Verbal   Consent given by:  Patient   Risks, benefits, and alternatives were discussed: yes     Risks discussed:  Infection, pain, poor cosmetic result, poor wound healing and need for additional repair   Alternatives discussed:  No treatment Universal protocol:    Imaging studies available: yes     Patient identity confirmed:  Verbally with patient and arm band Anesthesia:    Anesthesia method:  Local infiltration   Local anesthetic:  Lidocaine 2% WITH epi Laceration details:    Location:  Hand   Hand location:  L palm   Length (cm):  8 Pre-procedure details:    Preparation:  Patient was prepped and draped in usual sterile fashion  and imaging obtained to evaluate for foreign bodies Exploration:    Hemostasis achieved with:  Epinephrine and direct pressure   Imaging obtained: x-ray     Imaging outcome: foreign body not noted  Wound exploration: wound explored through full range of motion     Contaminated: no   Treatment:    Area cleansed with:  Saline   Amount of cleaning:  Extensive   Irrigation solution:  Sterile water   Irrigation method:  Pressure wash   Visualized foreign bodies/material removed: no     Debridement:  None   Undermining:  None Skin repair:    Repair method:  Sutures   Suture size:  4-0   Suture material:  Prolene   Suture technique:  Simple interrupted   Number of sutures:  12 Approximation:    Approximation:  Close Repair type:    Repair type:  Simple Post-procedure details:    Dressing:  Non-adherent dressing, sterile dressing and bulky dressing   Procedure completion:  Tolerated well, no immediate complications   Medications Ordered in ED Medications  lidocaine-EPINEPHrine (XYLOCAINE W/EPI) 2 %-1:200000 (PF) injection 10 mL (10 mLs Intradermal Given by Other 01/22/21 1905)  fentaNYL (SUBLIMAZE) injection 50 mcg (50 mcg Intramuscular Given 01/22/21 2000)  Tdap (BOOSTRIX) injection 0.5 mL (0.5 mLs Intramuscular Given 01/22/21 2057)    ED Course  I have reviewed the triage vital signs and the nursing notes.  Pertinent labs & imaging results that were available during my care of the patient were reviewed by me and considered in my medical decision making (see chart for details).    MDM Rules/Calculators/A&P                           70 year old female with extensive medical history.  Patient presents to ED after falling down half of a step in her garage due to losing her footing.  On examination, patient has 7 cm laceration to left palm with controlled bleeding.  Patient is neurovascularly intact on the affected appendage.  Patient has history of wrist fusion so her range of  motion is limited however she states that it is at her baseline currently.  I will image the wrist and hand to ensure there is no underlying fracture.    Patient is not up to date on her tetanus.  I will give the patient a tetanus booster while she is here.  Patient is currently on Plavix and is unsure if she hit her head.  Patient states that she might have "bumped" her head.  Patient neurological exam is without deficits and within normal limits.  I will CT this patient's head out of abundance of caution.   CT scan does not show any intracranial abnormality.  Patient is in significant amount of pain.  I will treat this patient's pain with 50 mcg of fentanyl prior to suture removal.  Patient laceration repaired per procedure note.  Patient x-rays of hand and wrist showed no underlying fractures.  I discussed with the patient the results of her imaging.  I gave this patient return precautions and she expressed full understanding with them.  I advised patient to return to this ED or to an urgent care in the next 7 to 10 days for removal of her sutures.  She expressed full understanding with these instructions.  I discussed this patient and her case with Dr. Anitra Lauth who is in agreement with the management and plan for discharge.  This patient is stable at discharge.      Final Clinical Impression(s) / ED Diagnoses Final diagnoses:  Laceration of left palm, initial encounter    Rx / DC Orders ED Discharge Orders  None        Clent Ridges 01/22/21 2340    Gwyneth Sprout, MD 01/24/21 [phone]

## 2021-01-22 NOTE — ED Notes (Signed)
Patient transported to X-ray 

## 2021-01-22 NOTE — Discharge Instructions (Addendum)
Return to ED with any new or worsening symptoms such as loss of sensation to hand, fevers, drainage from laceration You may present to an urgent care or back to this ED in 7 to 10 days for wound check and suture removal Change the dressing on the laceration once per day at a minimum You may shower and get the laceration wet as normal keep the area covered with gauze until you present back for wound check

## 2021-01-22 NOTE — ED Triage Notes (Signed)
Tripped and fell, tried to catch self and cut left hand on a cabinet. ? May have hit her head. She takes plavix. Denies LOC

## 2021-01-23 ENCOUNTER — Ambulatory Visit (INDEPENDENT_AMBULATORY_CARE_PROVIDER_SITE_OTHER): Payer: Medicare Other | Admitting: Cardiology

## 2021-01-23 ENCOUNTER — Encounter: Payer: Self-pay | Admitting: Cardiology

## 2021-01-23 VITALS — BP 138/64 | HR 67 | Ht 61.5 in | Wt 201.4 lb

## 2021-01-23 DIAGNOSIS — I493 Ventricular premature depolarization: Secondary | ICD-10-CM | POA: Diagnosis not present

## 2021-01-23 DIAGNOSIS — Z9861 Coronary angioplasty status: Secondary | ICD-10-CM

## 2021-01-23 DIAGNOSIS — I2510 Atherosclerotic heart disease of native coronary artery without angina pectoris: Secondary | ICD-10-CM

## 2021-01-23 DIAGNOSIS — I10 Essential (primary) hypertension: Secondary | ICD-10-CM

## 2021-01-23 DIAGNOSIS — Z6837 Body mass index (BMI) 37.0-37.9, adult: Secondary | ICD-10-CM

## 2021-01-23 DIAGNOSIS — E785 Hyperlipidemia, unspecified: Secondary | ICD-10-CM

## 2021-01-23 NOTE — Patient Instructions (Signed)
Medication Instructions:  No changes     Lab Work:  Please have primary to send copy of labs - once they are obtained   Testing/Procedures: Not needed   Follow-Up: At [facility], you and your health needs are our priority.  As part of our continuing mission to provide you with exceptional heart care, we have created designated Provider Care Teams.  These Care Teams include your primary Cardiologist (physician) and Advanced Practice Providers (APPs -  Physician Assistants and Nurse Practitioners) who all work together to provide you with the care you need, when you need it.     Your next appointment:   12 month(s)  The format for your next appointment:   In Person  Provider:   Bryan Lemma, MD      Happy Holidays!!!

## 2021-01-23 NOTE — Progress Notes (Signed)
Primary Care Provider: Bernerd Limbo, MD Cardiologist: Glenetta Hew, MD Electrophysiologist: None  Clinic Note: Chief Complaint  Patient presents with   Follow-up    6 months.  Doing better   Coronary Artery Disease    No angina    ===================================  ASSESSMENT/PLAN   Problem List Items Addressed This Visit       Cardiology Problems   Essential hypertension (Chronic)    For the most part controlled on combination of losartan-HCTZ at Toprol.  Elevated today because of pain.  But for the most part has been well controlled based on home recordings.      PVC's (premature ventricular contractions) (Chronic)    Palpitation symptoms.  Stable.  She is on stable low-dose of Toprol.  Tolerating well.  I think her " fatigue" is more related to deconditioning and obesity as well as her chronic back pain issues.  Continue low-dose beta-blocker therapy      Hyperlipidemia LDL goal <70 (Chronic)    Due for lipids to be drawn in January.  LDL was 73 as of June.  Her lipid labs have been somewhat labile with LDL level from 69-81.  Is on Praluent.  Statin intolerant.  At this point the patient have no option but to simply continue Praluent.  Has been intolerant of multiple medications.      CAD S/P percutaneous coronary angioplasty - Primary (Chronic)    No further anginal symptoms since PCI.  She is try to increase level activity and denies any exertional chest pain.  Does have some exertional dyspnea due to deconditioning but this also changes with her level of activity.  Plan: Continue, but not able to tolerate titration of Toprol beyond current dose of 5 mg daily.  Also did not tolerate being off of it. On Hyzaar (losartan-HCTZ at 100-25 mg daily. On Praluent for lipids. On clopidogrel monotherapy ` Okay to hold clopidogrel 5 to 7 days preop for surgeries or procedures.        Other   Obese (Chronic)    The patient understands the need to lose weight  with diet and exercise. We have discussed specific strategies for this.       ===================================  HPI:    Kiara Miller is a 70 y.o. female with a PMH notable for CAD (LAD PCI in August 2021 for progressive angina), HTN, HLD, and frequent PACs and chronic fatigue who presents today for 74-month follow-up.  [name] was last seen on July 18, 2020 -> he noted some exertional dyspnea related to deconditioning.  He noted that her heart rate had a tendency to go up at random times.  Some mild fatigue and edema.  Some of the fatigue improved with being on Singulair.  In well-controlled on occasions.  Was open to get his GI medicine for diverticulitis.  Significant depression-noted improvement in being on Lexapro, still not fully better.  Wanted to go to counseling. -> Toprol-XL 12.5; stop aspirin.  Labs checked.  Recent Hospitalizations:  ER visits 01/22/2021: [facility] - reaching for lightswitch while goind down stairs into garage --> multiple sutures (14) L hand.   Reviewed  CV studies:    The following studies were reviewed today: (if available, images/films reviewed: From Epic Chart or Care Everywhere) N/A:  Interval History:   [name] returns today overall doing okay from a cardiac standpoint.  She is under quite a bit of stress from her chronic back pain and also chronic GI pain from  her diverticulosis.  The gabapentin helps her back pain, but it makes her ankles and hands swell. She says her blood pressures usually running in the 110-130/60 mmHg range, but is high today because she just had her tetanus shot and was quite painful.  Early this morning was 124/66.  He has a occasional skipped beats but no significant palpitations or irregular heartbeats.  No chest pain or pressure with rest..  She  has exertional dyspnea from obesity of deconditioning.  No PND or orthopnea.  Mild edema worse with gabapentin.  She has baseline dyspnea with occasional pursed lip  exhalation, but not routinely.  CV Review of Symptoms (Summary) Cardiovascular ROS: positive for - shortness of breath and this is mostly her baseline with some exertional dyspnea due to deconditioning and obesity.  Trivial edema.  Very rare palpitations negative for - chest pain, orthopnea, paroxysmal nocturnal dyspnea, rapid heart rate, or lightheadedness or dizziness or wooziness, syncope/near syncope or TIA/amaurosis fugax or claudication  REVIEWED OF SYSTEMS   Review of Systems  Constitutional:  Negative for malaise/fatigue (Just deconditioned) and weight loss (Unfortunately, weight gain).  HENT:  Negative for nosebleeds.   Respiratory:  Positive for shortness of breath (With overexertion-deconditioning). Negative for cough.   Cardiovascular:  Positive for leg swelling (Stable; worse when she takes gabapentin).  Gastrointestinal:  Positive for abdominal pain (Recurrent pain from diverticulosis-no bleeding and no inflammation from diverticulitis). Negative for blood in stool and melena.  Genitourinary:  Negative for hematuria.  Musculoskeletal:  Positive for back pain (Very much limits activity) and joint pain (Knees as well as hands and fingers.). Negative for myalgias.  Neurological:  Negative for dizziness, focal weakness (Legs are weak from back pain) and weakness.  Psychiatric/Behavioral:  Negative for depression (Seems to better control) and memory loss. The patient is nervous/anxious. The patient does not have insomnia.    I have reviewed and (if needed) personally updated the patient's problem list, medications, allergies, past medical and surgical history, social and family history.   PAST MEDICAL HISTORY   Past Medical History:  Diagnosis Date   CAD S/P percutaneous coronary angioplasty 10/05/2019   Cardiac cath revealed 85% mid LAD between SP1 and D1 (DES PCI with resolute Onyx DES 2.5 mm x 15 mm-negative for 8 mm); extensive moderate CT FFR and DFR negative proximal to mid  RCA 60% and mid focal 45% as well as proximal to mid LCx 60% eccentric.  Both DFR was 0.97   Diverticulitis 05/2015   Hyperlipidemia    Hypertension    Joint pain    Osteoarthritis    Polyarthralgia    Rheumatoid arthritis (HCC)    Right knee pain     PAST SURGICAL HISTORY   Past Surgical History:  Procedure Laterality Date   ABDOMINAL HYSTERECTOMY     CARPAL TUNNEL RELEASE     CORONARY STENT INTERVENTION N/A 10/05/2019   Procedure: CORONARY STENT INTERVENTION;  Surgeon: Marykay Lex, MD;  Location[facility] INVASIVE CV LAB;  Service: Cardiovascular;  Laterality: N/A;; 85% mid LAD irregular lesion.  DES PCI reducing to 0% with resolute Onyx DES 2.5 mm x15 mm--2.8 mm.   KNEE ARTHROPLASTY     LEFT HEART CATH AND CORONARY ANGIOGRAPHY N/A 10/05/2019   Procedure: LEFT HEART CATH AND CORONARY ANGIOGRAPHY;  Surgeon: Marykay Lex, MD;  Location: [facility] INVASIVE CV LAB;  Service: Cardiovascular;: Mid LAD irregular 85% (DES PCI).  Extensive 60 to 65% proximal to mid RCA followed by 45% mid-distal RCA--DFR negative (0.97).  Proximal-mid LCx 60% eccentric lesion-DFR 0.97.  (Positive for DFR is<0.9)   MANDIBLE SURGERY     NM MYOVIEW LTD  06/2017   (Ordered for coronary calcium score 376) LOW RISK.  EF 72%.  No ischemia or infarction.  No wall motion normality.  No change from previous.   OSTEOTOMY PELVIS BILATERAL     TONSILLECTOMY     TONSILLECTOMY     TOTAL KNEE ARTHROPLASTY Right 09/02/2018   Procedure: TOTAL KNEE ARTHROPLASTY;  Surgeon: Paralee Cancel, MD;  Location: [facility];  Service: Orthopedics;  Laterality: Right;  70 mins   TRANSTHORACIC ECHOCARDIOGRAM  12/2010   EF 65-70%. Mild LVH. No RWMA/ Gr1 DD.   TUBAL LIGATION     WRIST FUSION     Cardiac Cath-PCI 10/05/2019: Severe single-vessel disease with segmental 80% irregular lesion in the mid LAD between SP1 and D1 (as suggested by CT scan) Successful DES PCI of mid LAD reducing 80% to 0% using Resolute Onyx DES 2.5 mm x 15 mm postdilated  to 2.8 mm. Extensive moderate/nonobstructive by both CT FFR and DFR lesions in the proximal RCA (irregular 60%) and mid RCA (focal 45%) as well as proximal-mid LCx 60% eccentric lesion. ->  DFR both vessels was roughly 0.97 Normal LVEF and normal EDP.  No R WMA. Diagnostic                                                        Intervention   Immunization History  Administered Date(s) Administered   Pneumococcal Polysaccharide-23 07/07/2012   Tdap 01/22/2021    MEDICATIONS/ALLERGIES   Current Meds  Medication Sig   Cholecalciferol (VITAMIN D3) 1.25 MG (50000 UT) CAPS 1 capsule   clopidogrel (PLAVIX) 75 MG tablet Take 1 tablet (75 mg total) by mouth daily.   escitalopram (LEXAPRO) 20 MG tablet Take 1 tablet by mouth daily.   ibuprofen (ADVIL) 200 MG tablet Take 800 mg by mouth 2 (two) times daily as needed for headache or moderate pain.   levothyroxine (SYNTHROID, LEVOTHROID) 75 MCG tablet Take 75 mcg by mouth daily before breakfast.   losartan-hydrochlorothiazide (HYZAAR) 100-25 MG per tablet Take 1 tablet by mouth daily.   metoprolol succinate (TOPROL-XL) 25 MG 24 hr tablet Take 12.5 mg by mouth daily.   montelukast (SINGULAIR) 10 MG tablet Take 10 mg by mouth at bedtime.   nitroGLYCERIN (NITROSTAT) 0.4 MG SL tablet Place 1 tablet (0.4 mg total) under the tongue every 5 (five) minutes as needed for chest pain. Call EMS if CP is not relieved after 2 doses. You may take up to 3 doses.   PRALUENT 150 MG/ML SOAJ Inject 150 mg into the skin every 14 (fourteen) days.    Allergies  Allergen Reactions   Amoxicillin-Pot Clavulanate Other (See Comments)   Clindamycin/Lincomycin Other (See Comments)    Severe esophagitis   Codeine Itching   Hydrocodone    Leflunomide Nausea Only   Penicillins Itching    Did it involve swelling of the face/tongue/throat, SOB, or low BP? No Did it involve sudden or severe rash/hives, skin peeling, or any reaction on the inside of your mouth or nose? No Did  you need to seek medical attention at a hospital or doctor's office? No When did it last happen?      2010 If all above answers are "  NO", may proceed with cephalosporin use.    Statins     Muscle weakness   Sulfamethoxazole-Trimethoprim Other (See Comments)   Tetracyclines & Related Nausea And Vomiting   Doxycycline Rash   Levofloxacin Nausea Only and Palpitations    SOCIAL HISTORY/FAMILY HISTORY   Reviewed in Epic:  Pertinent findings:  Social History   Tobacco Use   Smoking status: Former    Packs/day: 0.50    Years: 48.00    Pack years: 24.00    Types: Cigarettes    Quit date: 08/18/2018    Years since quitting: 2.4   Smokeless tobacco: Never  Vaping Use   Vaping Use: Never used  Substance Use Topics   Alcohol use: No   Drug use: No   Social History   Social History Narrative   Not on file    OBJCTIVE -PE, EKG, labs   Wt Readings from Last 3 Encounters:  01/23/21 201 lb 6.4 oz (91.4 kg)  07/18/20 191 lb 9.6 oz (86.9 kg)  04/20/20 195 lb (88.5 kg)    Physical Exam: BP 138/64    Pulse 67    Ht 5' 1.5" (1.562 m)    Wt 201 lb 6.4 oz (91.4 kg)    SpO2 98%    BMI 37.44 kg/m  Physical Exam Vitals reviewed.  Constitutional:      General: She is not in acute distress.    Appearance: Normal appearance. She is obese. She is not ill-appearing or toxic-appearing.  HENT:     Head: Normocephalic and atraumatic.  Neck:     Vascular: No carotid bruit or JVD.  Cardiovascular:     Rate and Rhythm: Normal rate and regular rhythm. No extrasystoles are present.    Chest Wall: PMI is not displaced.     Pulses: Normal pulses and intact distal pulses.     Heart sounds: S1 normal and S2 normal. Heart sounds are distant. No murmur heard.   No friction rub. No gallop.  Pulmonary:     Effort: Pulmonary effort is normal. No respiratory distress.     Breath sounds: Normal breath sounds. No wheezing, rhonchi or rales.  Chest:     Chest wall: No tenderness.  Musculoskeletal:         General: Swelling (Trivial ankle swelling) present. Normal range of motion.     Cervical back: Normal range of motion and neck supple.  Skin:    General: Skin is warm and dry.  Neurological:     General: No focal deficit present.     Mental Status: She is alert and oriented to person, place, and time.     Gait: Gait abnormal (Antalgic gait due to back pain.).  Psychiatric:        Mood and Affect: Mood normal.        Behavior: Behavior normal.        Thought Content: Thought content normal.        Judgment: Judgment normal.     Comments: In much better spirits    Adult ECG Report Not checked  Recent Labs: Reviewed-PCP to recheck in January. Lab Results  Component Value Date   CHOL 150 07/18/2020   HDL 54 07/18/2020   LDLCALC 73 07/18/2020   TRIG 130 07/18/2020   CHOLHDL 2.8 07/18/2020   Lab Results  Component Value Date   CREATININE 1.16 (H) 01/12/2020   BUN 22 01/12/2020   NA 139 01/12/2020   K 4.2 01/12/2020   CL 100 01/12/2020  CO2 24 01/12/2020   CBC Latest Ref Rng & Units 07/18/2020 10/01/2019 06/29/2019  WBC 3.4 - 10.8 x10E3/uL 6.0 6.0 3.8  Hemoglobin 11.1 - 15.9 g/dL 14.3 15.0 13.9  Hematocrit 34.0 - 46.6 % 42.7 42.8 41.9  Platelets 150 - 450 x10E3/uL 190 163 148    No results found for: HGBA1C No results found for: TSH  ==================================================  COVID-19 Education: The signs and symptoms of COVID-19 were discussed with the patient and how to seek care for testing (follow up with PCP or arrange E-visit).    I spent a total of 25 minutes with the patient spent in direct patient consultation.  Additional time spent with chart review  / charting (studies, outside notes, etc): 17 min Total Time: 42 min  Current medicines are reviewed at length with the patient today.  (+/- concerns) none  This visit occurred during the SARS-CoV-2 public health emergency.  Safety protocols were in place, including screening questions prior to the  visit, additional usage of staff PPE, and extensive cleaning of exam room while observing appropriate contact time as indicated for disinfecting solutions.  Notice: This dictation was prepared with Dragon dictation along with smart phrase technology. Any transcriptional errors that result from this process are unintentional and may not be corrected upon review.  Studies Ordered:   No orders of the defined types were placed in this encounter.   Patient Instructions / Medication Changes & Studies & Tests Ordered   Patient Instructions  Medication Instructions:  No changes     Lab Work:  Please have primary to send copy of labs - once they are obtained   Testing/Procedures: Not needed   Follow-Up: At [facility], you and your health needs are our priority.  As part of our continuing mission to provide you with exceptional heart care, we have created designated Provider Care Teams.  These Care Teams include your primary Cardiologist (physician) and Advanced Practice Providers (APPs -  Physician Assistants and Nurse Practitioners) who all work together to provide you with the care you need, when you need it.     Your next appointment:   12 month(s)  The format for your next appointment:   In Person  Provider:   Glenetta Hew, MD      Happy Holidays!!!     Glenetta Hew, M.D., M.S. Interventional Cardiologist   Pager # [phone] Phone # [phone] [address] [place], [place] [address]   Thank you for choosing [facility] at [facility]!!

## 2021-01-31 ENCOUNTER — Ambulatory Visit: Payer: Medicare Other | Admitting: *Deleted

## 2021-02-02 ENCOUNTER — Encounter: Payer: Medicare Other | Admitting: *Deleted

## 2021-02-08 ENCOUNTER — Encounter: Payer: Self-pay | Admitting: Cardiology

## 2021-02-08 NOTE — Assessment & Plan Note (Signed)
The patient understands the need to lose weight with diet and exercise. We have discussed specific strategies for this.  

## 2021-02-08 NOTE — Assessment & Plan Note (Signed)
Due for lipids to be drawn in January.  LDL was 73 as of June.  Her lipid labs have been somewhat labile with LDL level from 69-81.  Is on Praluent.  Statin intolerant.  At this point the patient have no option but to simply continue Praluent.  Has been intolerant of multiple medications.

## 2021-02-08 NOTE — Assessment & Plan Note (Signed)
For the most part controlled on combination of losartan-HCTZ at Toprol.  Elevated today because of pain.  But for the most part has been well controlled based on home recordings.

## 2021-02-08 NOTE — Assessment & Plan Note (Signed)
No further anginal symptoms since PCI.  She is try to increase level activity and denies any exertional chest pain.  Does have some exertional dyspnea due to deconditioning but this also changes with her level of activity.  Plan:  Continue, but not able to tolerate titration of Toprol beyond current dose of 5 mg daily.  Also did not tolerate being off of it.  On Hyzaar (losartan-HCTZ at 100-25 mg daily.  On Praluent for lipids.  On clopidogrel monotherapy  ` Okay to hold clopidogrel 5 to 7 days preop for surgeries or procedures.

## 2021-02-08 NOTE — Assessment & Plan Note (Signed)
Palpitation symptoms.  Stable.  She is on stable low-dose of Toprol.  Tolerating well.  I think her " fatigue" is more related to deconditioning and obesity as well as her chronic back pain issues.  Continue low-dose beta-blocker therapy

## 2021-02-13 ENCOUNTER — Encounter: Payer: Self-pay | Admitting: Cardiology

## 2021-07-05 ENCOUNTER — Other Ambulatory Visit: Payer: Self-pay | Admitting: Family Medicine

## 2021-07-05 ENCOUNTER — Encounter: Payer: Self-pay | Admitting: Cardiology

## 2021-07-07 ENCOUNTER — Other Ambulatory Visit: Payer: Self-pay | Admitting: Adult Health Nurse Practitioner

## 2021-07-07 DIAGNOSIS — N644 Mastodynia: Secondary | ICD-10-CM

## 2021-07-07 NOTE — Telephone Encounter (Signed)
Labs look good.  Bryan Lemma, MD

## 2021-07-17 ENCOUNTER — Other Ambulatory Visit: Payer: Self-pay | Admitting: Adult Health Nurse Practitioner

## 2021-07-17 ENCOUNTER — Other Ambulatory Visit: Payer: Self-pay | Admitting: Cardiology

## 2021-07-17 DIAGNOSIS — N644 Mastodynia: Secondary | ICD-10-CM

## 2021-08-03 ENCOUNTER — Other Ambulatory Visit: Payer: Self-pay | Admitting: Cardiology

## 2021-08-04 ENCOUNTER — Ambulatory Visit: Payer: Medicare Other

## 2021-08-04 ENCOUNTER — Ambulatory Visit
Admission: RE | Admit: 2021-08-04 | Discharge: 2021-08-04 | Disposition: A | Discharge status: Home / Self Care | Payer: Medicare Other | Source: Ambulatory Visit | Attending: Adult Health Nurse Practitioner | Admitting: Adult Health Nurse Practitioner

## 2021-08-04 DIAGNOSIS — N644 Mastodynia: Secondary | ICD-10-CM

## 2021-08-22 ENCOUNTER — Telehealth: Payer: Self-pay | Admitting: Cardiology

## 2021-08-22 DIAGNOSIS — I2510 Atherosclerotic heart disease of native coronary artery without angina pectoris: Secondary | ICD-10-CM

## 2021-08-22 DIAGNOSIS — E785 Hyperlipidemia, unspecified: Secondary | ICD-10-CM

## 2021-08-22 NOTE — Telephone Encounter (Signed)
PA for Repatha submitted.  Key: Kiara Miller

## 2021-08-22 NOTE — Telephone Encounter (Signed)
Pt c/o medication issue:  1. Name of Medication: PRALUENT 150 MG/ML SOAJ  2. How are you currently taking this medication (dosage and times per day)? As prescribed   3. Are you having a reaction (difficulty breathing--STAT)? No   4. What is your medication issue? Patient states her pharmacy called her today advising her she is needing PA for this medication.

## 2021-08-23 MED ORDER — REPATHA SURECLICK 140 MG/ML ~~LOC~~ SOAJ: 1.0000 mL | SUBCUTANEOUS | 3 refills | Status: DC

## 2021-08-23 MED ORDER — PRALUENT 150 MG/ML ~~LOC~~ SOAJ: 150.0000 mg | SUBCUTANEOUS | 3 refills | Status: DC

## 2021-08-23 NOTE — Telephone Encounter (Signed)
Called patient and left message on machine.

## 2021-08-23 NOTE — Telephone Encounter (Signed)
Praluent approved through 02/04/22

## 2021-08-23 NOTE — Telephone Encounter (Signed)
PA approved through 02/22/22

## 2021-08-23 NOTE — Addendum Note (Signed)
Addended by: Cheree Ditto on: 08/23/2021 07:54 AM   Modules accepted: Orders

## 2021-08-23 NOTE — Addendum Note (Signed)
Addended by: Cheree Ditto on: 08/23/2021 09:54 AM   Modules accepted: Orders

## 2021-08-23 NOTE — Telephone Encounter (Signed)
Patient called back. Would prefer to stay on Praluent.  Sent PA for Praluent.  Key: [id number]

## 2022-01-05 ENCOUNTER — Encounter: Payer: Self-pay | Admitting: Cardiology

## 2022-01-06 NOTE — Telephone Encounter (Signed)
Cholesterol, Total 135  Triglycerides 104  HDL 48  VLDL Cholesterol Cal 19  LDL 68  LDL/HDL Ratio 1.4    Labs look Good - Praluent WORKS!!.   Great results.  DH

## 2022-02-22 ENCOUNTER — Other Ambulatory Visit (HOSPITAL_COMMUNITY): Payer: Self-pay

## 2022-03-16 ENCOUNTER — Encounter: Payer: Self-pay | Admitting: Cardiology

## 2022-03-16 ENCOUNTER — Ambulatory Visit: Payer: Medicare Other | Attending: Cardiology | Admitting: Cardiology

## 2022-03-16 VITALS — BP 106/64 | HR 61 | Ht 61.0 in | Wt 209.6 lb

## 2022-03-16 DIAGNOSIS — I10 Essential (primary) hypertension: Secondary | ICD-10-CM | POA: Diagnosis not present

## 2022-03-16 DIAGNOSIS — Z9861 Coronary angioplasty status: Secondary | ICD-10-CM | POA: Diagnosis not present

## 2022-03-16 DIAGNOSIS — I493 Ventricular premature depolarization: Secondary | ICD-10-CM

## 2022-03-16 DIAGNOSIS — I2510 Atherosclerotic heart disease of native coronary artery without angina pectoris: Secondary | ICD-10-CM | POA: Diagnosis not present

## 2022-03-16 DIAGNOSIS — Z6837 Body mass index (BMI) 37.0-37.9, adult: Secondary | ICD-10-CM

## 2022-03-16 DIAGNOSIS — I491 Atrial premature depolarization: Secondary | ICD-10-CM

## 2022-03-16 DIAGNOSIS — D692 Other nonthrombocytopenic purpura: Secondary | ICD-10-CM

## 2022-03-16 DIAGNOSIS — R002 Palpitations: Secondary | ICD-10-CM | POA: Diagnosis not present

## 2022-03-16 DIAGNOSIS — E785 Hyperlipidemia, unspecified: Secondary | ICD-10-CM

## 2022-03-16 NOTE — Patient Instructions (Signed)

## 2022-03-16 NOTE — Progress Notes (Signed)
Primary Care Provider: Bernerd Limbo, [place] Cardiologist: Glenetta Hew, MD Electrophysiologist: None  Clinic Note: Chief Complaint  Patient presents with   Follow-up    Annual follow-up.  Doing well.  No major issues.  Weight gain.    ===================================  ASSESSMENT/PLAN   Problem List Items Addressed This Visit       Cardiology Problems   Senile purpura (HCC) (Chronic)    Somewhat nuisance bruising that appears to be exacerbated by Plavix. For significant bruising, okay to hold Plavix 2 to 3 days, and for true bleeding, would hold for 3 to 5 days.      PVC's (premature ventricular contractions) (Chronic)    She is not really noticing that much in the way of any palpitations.  Tolerating low-dose beta-blocker.  Continue to monitor.      PAC (premature atrial contraction) (Chronic)   Relevant Orders   EKG 12-Lead (Completed)   Hyperlipidemia LDL goal <70 (Chronic)    Hypoxia labs since 2022.  She will be due for labs recheck soon.  Most recent check showed LDL of 73 back in June 2022.      Essential hypertension (Chronic)   CAD S/P percutaneous coronary angioplasty - Primary (Chronic)    No further angina since PCI.  No resting exertional chest pain or pressure.  Had some diffuse moderate disease elsewhere being treated medically.  No recurrent symptoms. She does note some bruising that seems to be stabilized.  Tolerating other medicines well.  Plan: On stable low-dose Toprol 12.5 mg daily along with losartan/HCTZ 100-25 mg daily. Tolerating Praluent for lipids. On Plavix monotherapy for prophylaxis Okay to hold Plavix/clopidogrel 5 to 7 days preop for surgical procedures.      Relevant Orders   EKG 12-Lead (Completed)     Other   Palpitations (Chronic)   Relevant Orders   EKG 12-Lead (Completed)   Obese (Chronic)    Unfortunately, she has gained weight.  We discussed importance of adjusting her diet and try to increase  level of exercise.       ===================================  HPI:    Kiara Miller is a 72 y.o. female with a PMH notable for CAD (LAD PCI in August 2021 for progressive angina), HTN, HLD, frequent PACs and chronic fatigue  who presents today for Delayed annual follow-up.  Kiara Miller was last seen in December 2022 shortly after a fall where she injured her left hand.  She was doing okay regarding standpoint just under a bit of stress due to chronic back pain as well as chronic GI issues from diverticulosis.  Gabapentin was helping the back pain but making her ankles and hands swell.  BP levels are stable in the 110-1 130/60 mmHg range.  Occasional skipped beats but no prolonged palpitations or irregular heartbeats.  No chest pain or pressure with rest or exertion.  Exertional dyspnea because of deconditioning.  No PND orthopnea.  Mild baseline dyspnea with pursed-lip breathing. Cardiovascular ROS: positive for - shortness of breath and this is mostly her baseline with some exertional dyspnea due to deconditioning and obesity.  Trivial edema.  Very rare palpitations negative for - chest pain, orthopnea, paroxysmal nocturnal dyspnea, rapid heart rate, or lightheadedness or dizziness or wooziness, syncope/near syncope or TIA/amaurosis fugax or claudication Fatigue related to deconditioning, continue low-dose Toprol 12.5 mg, along with losartan-HCTZ On Praluent for lipids. Plavix monotherapy   Recent Hospitalizations: n/a  Reviewed  CV studies:    The following studies were reviewed  today: (if available, images/films reviewed: From Epic Chart or Care Everywhere) None:  Interval History:   Kiara Miller returns for annual follow-up overall doing fairly well.  She is really most limited by back and joint pain I radiate down to her hip and buttocks.  Other than that she does some mild bruising but has not had any active cardiac symptoms to speak of.  No active angina or dyspnea with rest  or exertion besides her baseline dyspnea from weight gain and deconditioning.  Blood pressures have been stable at home.  No rapid irregular heartbeats palpitations.  No syncope or near syncope.  No TIA or amaurosis fugax.  No claudication.  She notes mild diffuse bruising on Plavix.  Seems to be tolerating Praluent well.  REVIEWED OF SYSTEMS   Review of Systems  Constitutional:  Negative for malaise/fatigue and weight loss (Gained weight).  HENT:  Negative for congestion.   Respiratory:  Negative for cough and shortness of breath.   Cardiovascular:  Negative for orthopnea and claudication.  Gastrointestinal:  Negative for blood in stool and melena.  Genitourinary:  Negative for hematuria.  Musculoskeletal:  Positive for joint pain. Negative for falls.  Neurological:  Negative for dizziness and focal weakness.  Endo/Heme/Allergies:  Bruises/bleeds easily.  Psychiatric/Behavioral:  Negative for memory loss. The patient is not nervous/anxious and does not have insomnia.    I have reviewed and (if needed) personally updated the patient's problem list, medications, allergies, past medical and surgical history, social and family history.   PAST MEDICAL HISTORY   Past Medical History:  Diagnosis Date   CAD S/P percutaneous coronary angioplasty 10/05/2019   Cardiac cath revealed 85% mid LAD between SP1 and D1 (DES PCI with resolute Onyx DES 2.5 mm x 15 mm-negative for 8 mm); extensive moderate CT FFR and DFR negative proximal to mid RCA 60% and mid focal 45% as well as proximal to mid LCx 60% eccentric.  Both DFR was 0.97   Diverticulitis 05/2015   Hyperlipidemia    Hypertension    Joint pain    Osteoarthritis    Polyarthralgia    Rheumatoid arthritis ([place])    Right knee pain     PAST SURGICAL HISTORY   Past Surgical History:  Procedure Laterality Date   ABDOMINAL HYSTERECTOMY     CARPAL TUNNEL RELEASE     CORONARY STENT INTERVENTION N/A 10/05/2019   Procedure: CORONARY STENT  INTERVENTION;  Surgeon: Leonie Man, MD;  Location: [facility];  Service: Cardiovascular;  Laterality: N/A;; 85% mid LAD irregular lesion.  DES PCI reducing to 0% with resolute Onyx DES 2.5 mm x15 mm--2.8 mm.   KNEE ARTHROPLASTY     LEFT HEART CATH AND CORONARY ANGIOGRAPHY N/A 10/05/2019   Procedure: LEFT HEART CATH AND CORONARY ANGIOGRAPHY;  Surgeon: Leonie Man, MD;  Location: [facility];  Service: Cardiovascular;: Mid LAD irregular 85% (DES PCI).  Extensive 60 to 65% proximal to mid RCA followed by 45% mid-distal RCA--DFR negative (0.97).  Proximal-mid LCx 60% eccentric lesion-DFR 0.97.  (Positive for DFR is<0.9)   MANDIBLE SURGERY     NM MYOVIEW LTD  06/2017   (Ordered for coronary calcium score 376) LOW RISK.  EF 72%.  No ischemia or infarction.  No wall motion normality.  No change from previous.   OSTEOTOMY PELVIS BILATERAL     TONSILLECTOMY     TONSILLECTOMY     TOTAL KNEE ARTHROPLASTY Right 09/02/2018   Procedure: TOTAL KNEE ARTHROPLASTY;  Surgeon: Paralee Cancel, MD;  Location: [facility];  Service: Orthopedics;  Laterality: Right;  70 mins   TRANSTHORACIC ECHOCARDIOGRAM  12/2010   EF 65-70%. Mild LVH. No RWMA/ Gr1 DD.   TUBAL LIGATION     WRIST FUSION     Cardiac Cath-PCI 10/05/2019: Severe 1V CAD: segmental 80% irregular lesion - mid LAD btw  SP1 & D1 (as suggested by CT scan) Successful DES PCI of mid LAD reducing 80% to 0% using Resolute Onyx DES 2.5 mm x 15 mm postdilated to 2.8 mm. Extensive moderate/nonobstructive by both CT FFR and DFR lesions in the proximal RCA (irregular 60%) and mid RCA (focal 45%) as well as proximal-mid LCx 60% eccentric lesion. ->  DFR both vessels was roughly 0.97 Normal LVEF and normal EDP.  No R WMA. Diagnostic                                                        Intervention   MEDICATIONS/ALLERGIES   Current Meds  Medication Sig   Alirocumab (PRALUENT) 150 MG/ML SOAJ Inject 150 mg into the skin every 14 (fourteen) days.    azelastine (ASTELIN) 0.1 % nasal spray Place 2 sprays into both nostrils as needed for allergies.   Cholecalciferol (VITAMIN D3) 1.25 MG (50000 UT) CAPS Take 1 capsule by mouth once a week.   clopidogrel (PLAVIX) 75 MG tablet TAKE 1 TABLET DAILY   escitalopram (LEXAPRO) 20 MG tablet Take 1 tablet by mouth daily.   fluticasone (FLONASE) 50 MCG/ACT nasal spray Place 2 sprays into both nostrils as needed for allergies.   gabapentin (NEURONTIN) 300 MG capsule Take 300 mg by mouth daily at 6 (six) AM.   ibuprofen (ADVIL) 200 MG tablet Take 800 mg by mouth 2 (two) times daily as needed for headache or moderate pain.   ketoconazole (NIZORAL) 2 % cream Apply 1 application  topically daily as needed for irritation.   levothyroxine (SYNTHROID, LEVOTHROID) 75 MCG tablet Take 75 mcg by mouth daily before breakfast.   losartan-hydrochlorothiazide (HYZAAR) 100-25 MG per tablet Take 1 tablet by mouth daily.   metoprolol succinate (TOPROL-XL) 25 MG 24 hr tablet Take 0.5 tablets (12.5 mg total) by mouth daily.   montelukast (SINGULAIR) 10 MG tablet Take 10 mg by mouth at bedtime.   tiZANidine (ZANAFLEX) 2 MG tablet Take 2 mg by mouth as needed for muscle spasms.    Allergies  Allergen Reactions   Amoxicillin-Pot Clavulanate Other (See Comments)   Clindamycin/Lincomycin Other (See Comments)    Severe esophagitis   Codeine Itching   Hydrocodone    Leflunomide Nausea Only   Penicillins Itching    Did it involve swelling of the face/tongue/throat, SOB, or low BP? No Did it involve sudden or severe rash/hives, skin peeling, or any reaction on the inside of your mouth or nose? No Did you need to seek medical attention at a hospital or doctor's office? No When did it last happen?      2010 If all above answers are "NO", may proceed with cephalosporin use.    Statins     Muscle weakness   Sulfamethoxazole-Trimethoprim Other (See Comments)   Tetracyclines & Related Nausea And Vomiting   Doxycycline Rash    Levofloxacin Nausea Only and Palpitations    SOCIAL HISTORY/FAMILY HISTORY   Reviewed in Epic:  Pertinent findings:  Social History   Tobacco Use   Smoking status: Former    Packs/day: 0.50    Years: 48.00    Total pack years: 24.00    Types: Cigarettes    Quit date: 08/18/2018    Years since quitting: 3.6   Smokeless tobacco: Never  Vaping Use   Vaping Use: Never used  Substance Use Topics   Alcohol use: No   Drug use: No   Social History   Social History Narrative   Not on file    OBJCTIVE -PE, EKG, labs   Wt Readings from Last 3 Encounters:  03/16/22 209 lb 9.6 oz (95.1 kg)  01/23/21 201 lb 6.4 oz (91.4 kg)  07/18/20 191 lb 9.6 oz (86.9 kg)    Physical Exam: BP 106/64   Pulse 61   Ht 5' 1"$  (1.549 m)   Wt 209 lb 9.6 oz (95.1 kg)   SpO2 98%   BMI 39.60 kg/m  Physical Exam Vitals reviewed.  Constitutional:      General: She is not in acute distress.    Appearance: She is not ill-appearing or toxic-appearing.     Comments: Morbidly obese now weight gain.  Otherwise well-groomed  HENT:     Head: Normocephalic and atraumatic.  Neck:     Vascular: No carotid bruit or JVD.  Cardiovascular:     Rate and Rhythm: Normal rate and regular rhythm. Occasional Extrasystoles are present.    Chest Wall: PMI is not displaced.     Pulses: Normal pulses and intact distal pulses.     Heart sounds: S1 normal and S2 normal. Heart sounds are distant. No murmur heard.    No friction rub. No gallop.  Pulmonary:     Effort: Pulmonary effort is normal. No respiratory distress.     Breath sounds: Normal breath sounds. No wheezing, rhonchi or rales.  Musculoskeletal:        General: No swelling. Normal range of motion.     Cervical back: Normal range of motion and neck supple.  Skin:    General: Skin is warm and dry.     Coloration: Skin is not jaundiced.     Findings: Bruising (Diffuse senile purpura in the upper extremities.) present.  Neurological:     General: No  focal deficit present.     Mental Status: She is alert and oriented to person, place, and time.  Psychiatric:        Mood and Affect: Mood normal.        Behavior: Behavior normal.        Thought Content: Thought content normal.        Judgment: Judgment normal.     Adult ECG Report  Rate: 61 ;  Rhythm: normal sinus rhythm and low voltage.  Cannot rule out anterolateral MI, age-indeterminate. ;   Narrative Interpretation: Stable  Recent Labs:  12/25/2021  Cholesterol, Total 135  Triglycerides 104  HDL 48  VLDL Cholesterol Cal 19  LDL 68   Lab Results  Component Value Date   CHOL 150 07/18/2020   HDL 54 07/18/2020   LDLCALC 73 07/18/2020   TRIG 130 07/18/2020   CHOLHDL 2.8 07/18/2020   Lab Results  Component Value Date   CREATININE 1.16 (H) 01/12/2020   BUN 22 01/12/2020   NA 139 01/12/2020   K 4.2 01/12/2020   CL 100 01/12/2020   CO2 24 01/12/2020      Latest Ref Rng & Units 07/18/2020   12:13  PM 10/01/2019    2:28 PM 06/29/2019   10:36 AM  CBC  WBC 3.4 - 10.8 x10E3/uL 6.0  6.0  3.8   Hemoglobin 11.1 - 15.9 g/dL 14.3  15.0  13.9   Hematocrit 34.0 - 46.6 % 42.7  42.8  41.9   Platelets 150 - 450 x10E3/uL 190  163  148     No results found for: "HGBA1C" No results found for: "TSH"  ================================================== I spent a total of 25 minutes with the patient spent in direct patient consultation.  Additional time spent with chart review  / charting (studies, outside notes, etc): 18 min Total Time: 43 min  Current medicines are reviewed at length with the patient today.  (+/- concerns) none  Notice: This dictation was prepared with Dragon dictation along with smart phrase technology. Any transcriptional errors that result from this process are unintentional and may not be corrected upon review.  Studies Ordered:   Orders Placed This Encounter  Procedures   EKG 12-Lead   No orders of the defined types were placed in this  encounter.   Patient Instructions / Medication Changes & Studies & Tests Ordered   Patient Instructions  Medication Instructions:  No changes  *If you need a refill on your cardiac medications before your next appointment, please call your pharmacy*   Lab Work: Not needed   Testing/Procedures:  Not needed  Follow-Up: At [facility], you and your health needs are our priority.  As part of our continuing mission to provide you with exceptional heart care, we have created designated Provider Care Teams.  These Care Teams include your primary Cardiologist (physician) and Advanced Practice Providers (APPs -  Physician Assistants and Nurse Practitioners) who all work together to provide you with the care you need, when you need it.     Your next appointment:   12 month(s)  The format for your next appointment:   In Person  Provider:   Glenetta Hew, MD       Leonie Man, MD, MS Glenetta Hew, M.D., M.S. Interventional Cardiologist  [place]  Pager # [phone] Phone # [phone] [address] [place], [place] [address]   Thank you for choosing [facility]!!

## 2022-03-26 ENCOUNTER — Encounter: Payer: Self-pay | Admitting: Cardiology

## 2022-03-26 DIAGNOSIS — D692 Other nonthrombocytopenic purpura: Secondary | ICD-10-CM | POA: Insufficient documentation

## 2022-03-26 NOTE — Assessment & Plan Note (Signed)
Hypoxia labs since 2022.  She will be due for labs recheck soon.  Most recent check showed LDL of 73 back in June 2022.

## 2022-03-26 NOTE — Assessment & Plan Note (Signed)
Somewhat nuisance bruising that appears to be exacerbated by Plavix. For significant bruising, okay to hold Plavix 2 to 3 days, and for true bleeding, would hold for 3 to 5 days.

## 2022-03-26 NOTE — Assessment & Plan Note (Signed)
She is not really noticing that much in the way of any palpitations.  Tolerating low-dose beta-blocker.  Continue to monitor.

## 2022-03-26 NOTE — Assessment & Plan Note (Signed)
No further angina since PCI.  No resting exertional chest pain or pressure.  Had some diffuse moderate disease elsewhere being treated medically.  No recurrent symptoms. She does note some bruising that seems to be stabilized.  Tolerating other medicines well.  Plan: On stable low-dose Toprol 12.5 mg daily along with losartan/HCTZ 100-25 mg daily. Tolerating Praluent for lipids. On Plavix monotherapy for prophylaxis Okay to hold Plavix/clopidogrel 5 to 7 days preop for surgical procedures.

## 2022-03-26 NOTE — Assessment & Plan Note (Signed)
Unfortunately, she has gained weight.  We discussed importance of adjusting her diet and try to increase level of exercise.

## 2022-06-25 ENCOUNTER — Other Ambulatory Visit: Payer: Self-pay | Admitting: Cardiology

## 2022-07-17 ENCOUNTER — Other Ambulatory Visit: Payer: Self-pay | Admitting: Family Medicine

## 2022-07-17 DIAGNOSIS — Z1231 Encounter for screening mammogram for malignant neoplasm of breast: Secondary | ICD-10-CM

## 2022-08-07 ENCOUNTER — Ambulatory Visit
Admission: RE | Admit: 2022-08-07 | Discharge: 2022-08-07 | Disposition: A | Discharge status: Home / Self Care | Payer: Medicare Other | Source: Ambulatory Visit | Attending: Family Medicine | Admitting: Family Medicine

## 2022-08-07 DIAGNOSIS — Z1231 Encounter for screening mammogram for malignant neoplasm of breast: Secondary | ICD-10-CM

## 2022-08-20 ENCOUNTER — Other Ambulatory Visit: Payer: Self-pay | Admitting: Cardiology

## 2022-08-20 DIAGNOSIS — I2510 Atherosclerotic heart disease of native coronary artery without angina pectoris: Secondary | ICD-10-CM

## 2022-08-20 DIAGNOSIS — E785 Hyperlipidemia, unspecified: Secondary | ICD-10-CM

## 2022-09-19 ENCOUNTER — Other Ambulatory Visit: Payer: Self-pay

## 2022-09-19 ENCOUNTER — Ambulatory Visit: Payer: Medicare Other | Attending: Physician Assistant | Admitting: Physical Therapy

## 2022-09-19 DIAGNOSIS — M5459 Other low back pain: Secondary | ICD-10-CM | POA: Diagnosis present

## 2022-09-19 DIAGNOSIS — M5442 Lumbago with sciatica, left side: Secondary | ICD-10-CM | POA: Insufficient documentation

## 2022-09-19 DIAGNOSIS — G8929 Other chronic pain: Secondary | ICD-10-CM | POA: Diagnosis present

## 2022-09-19 DIAGNOSIS — M62830 Muscle spasm of back: Secondary | ICD-10-CM | POA: Insufficient documentation

## 2022-09-19 NOTE — Therapy (Signed)
OUTPATIENT PHYSICAL THERAPY THORACOLUMBAR EVALUATION   Patient Name: Kiara Miller MRN: [id number] DOB:[birth year], 72 y.o., female Today's Date: 09/19/2022  END OF SESSION:  PT End of Session - 09/19/22 1327     Visit Number 1    Number of Visits 12    Date for PT Re-Evaluation 12/18/22    Authorization Type FOTO.    PT Start Time 0103    PT Stop Time 0155    PT Time Calculation (min) 52 min    Activity Tolerance Patient tolerated treatment well    Behavior During Therapy [facility] for tasks assessed/performed             Past Medical History:  Diagnosis Date   CAD S/P percutaneous coronary angioplasty 10/05/2019   Cardiac cath revealed 85% mid LAD between SP1 and D1 (DES PCI with resolute Onyx DES 2.5 mm x 15 mm-negative for 8 mm); extensive moderate CT FFR and DFR negative proximal to mid RCA 60% and mid focal 45% as well as proximal to mid LCx 60% eccentric.  Both DFR was 0.97   Diverticulitis 05/2015   Hyperlipidemia    Hypertension    Joint pain    Osteoarthritis    Polyarthralgia    Rheumatoid arthritis (HCC)    Right knee pain    Past Surgical History:  Procedure Laterality Date   ABDOMINAL HYSTERECTOMY     CARPAL TUNNEL RELEASE     CORONARY STENT INTERVENTION N/A 10/05/2019   Procedure: CORONARY STENT INTERVENTION;  Surgeon: Marykay Lex, MD;  Location: [facility];  Service: Cardiovascular;  Laterality: N/A;; 85% mid LAD irregular lesion.  DES PCI reducing to 0% with resolute Onyx DES 2.5 mm x15 mm--2.8 mm.   KNEE ARTHROPLASTY     LEFT HEART CATH AND CORONARY ANGIOGRAPHY N/A 10/05/2019   Procedure: LEFT HEART CATH AND CORONARY ANGIOGRAPHY;  Surgeon: Marykay Lex, MD;  Location: [facility];  Service: Cardiovascular;: Mid LAD irregular 85% (DES PCI).  Extensive 60 to 65% proximal to mid RCA followed by 45% mid-distal RCA--DFR negative (0.97).  Proximal-mid LCx 60% eccentric lesion-DFR 0.97.  (Positive for DFR is<0.9)   MANDIBLE SURGERY     NM  MYOVIEW LTD  06/2017   (Ordered for coronary calcium score 376) LOW RISK.  EF 72%.  No ischemia or infarction.  No wall motion normality.  No change from previous.   OSTEOTOMY PELVIS BILATERAL     TONSILLECTOMY     TONSILLECTOMY     TOTAL KNEE ARTHROPLASTY Right 09/02/2018   Procedure: TOTAL KNEE ARTHROPLASTY;  Surgeon: Durene Romans, MD;  Location: [facility];  Service: Orthopedics;  Laterality: Right;  70 mins   TRANSTHORACIC ECHOCARDIOGRAM  12/2010   EF 65-70%. Mild LVH. No RWMA/ Gr1 DD.   TUBAL LIGATION     WRIST FUSION     Patient Active Problem List   Diagnosis Date Noted   Senile purpura (HCC) 03/26/2022   Hyperlipidemia LDL goal <70 10/27/2019   Progressive angina (HCC) 10/05/2019   Abnormal cardiac CT angiography 10/05/2019   CAD S/P percutaneous coronary angioplasty 10/05/2019   Depression 04/09/2019   Obese 09/03/2018   S/P right TKA 09/02/2018   PAC (premature atrial contraction) 07/11/2017   History of hypothyroidism 06/11/2017   Essential hypertension 06/11/2017   History of obesity 06/11/2017   S/P right knee arthroscopy 06/11/2017   DDD (degenerative disc disease), cervical 06/11/2017   DDD (degenerative disc disease), lumbar 06/11/2017   History of carpal tunnel surgery  of right wrist 06/11/2017   Primary osteoarthritis of both knees 06/11/2017   PVC's (premature ventricular contractions) 05/03/2017   Agatston coronary artery calcium score between 200 and 399 05/03/2017   Rheumatoid arthritis of multiple sites without rheumatoid factor (HCC) 10/10/2015   Uveitis 10/10/2015   Diverticulitis 10/10/2015   Palpitations 12/05/2010    REFERRING PROVIDER: Linda Hedges Dubicki PA-C  REFERRING DIAG: Lumbar spondylosis  Rationale for Evaluation and Treatment: Rehabilitation  THERAPY DIAG:  Other low back pain  Muscle spasm of back  ONSET DATE: "Years."  SUBJECTIVE:                                                                                                                                                                                            SUBJECTIVE STATEMENT: The patient presents to the clinic with chronic low back pain.  Injection sin the past have been very helpful but are no longer having a lasting effect as they was were.  Her pain is rated at a 6/10 today but can rise to severe levels after standing 10-15 minutes.  Her pain limits her ability to perform ADL's.    PERTINENT HISTORY:  See above.  PAIN:  Are you having pain? Yes: NPRS scale: 6/10 Pain location: Across low back and right > left. Pain description: Ache. Aggravating factors: Standing and walking. Relieving factors: Rest.  PRECAUTIONS: None  RED FLAGS: None   WEIGHT BEARING RESTRICTIONS: No  FALLS:  Has patient fallen in last 6 months? No  LIVING ENVIRONMENT: Lives with: lives with their spouse Lives in: House/apartment Has following equipment at home: None  OCCUPATION: Retired.  PLOF: Independent with basic ADLs  PATIENT GOALS: Decrease pain and do more.  Improve stamina.    OBJECTIVE:   DIAGNOSTIC FINDINGS:  Lumbar spondylosis.  There is facet arthropathy at L4-5 with degenerative spondylolisthesis. There appears to be ankylosis of the facet joints.  There is no significant spinal stenosis.   PATIENT SURVEYS:  FOTO .  POSTURE: rounded shoulders and forward head  PALPATION: C/o pain across her lower lumbar region and very tender to palpation over her right SIJ.  LUMBAR ROM:  Active lumbar flexion is limited by 50% and extension is 20 degrees.   LOWER EXTREMITY MMT:   Normal LE strength.  LUMBAR SPECIAL TESTS:  Equal leg lengths. (-) SLR testing.   GAIT: Slow and cautious in obvious pain without assistive device.  TODAY'S TREATMENT:  DATE: HMP and IFC at 80-150 Hz on 40% scan x 20 minutes to patient's right low  back.  Normal modality response following removal of modality.  PATIENT EDUCATION:  Education details: Discussed ry needling and provided patient with a consent form. Person educated: Patient Education method: Explanation Education comprehension: verbalized understanding  HOME EXERCISE PROGRAM:   ASSESSMENT:  CLINICAL IMPRESSION: The patient presents to OPPT with chronic low back pain.  Her CC, today, is pain across her low back, right > left.  She was very palpably tender over her right SIJ.  She has normal LE strength.  Special testing is negative.  Patient can only stand for up to 10-15 minutes then has to sit due to very high pain-levels.  Patient will benefit from skilled physical therapy intervention to address pain and deficits.  OBJECTIVE IMPAIRMENTS: Abnormal gait, decreased activity tolerance, decreased ROM, increased muscle spasms, and pain.   ACTIVITY LIMITATIONS: carrying, lifting, bending, standing, and locomotion level  PARTICIPATION LIMITATIONS: meal prep, cleaning, laundry, community activity, and yard work  PERSONAL FACTORS: Time since onset of injury/illness/exacerbation are also affecting patient's functional outcome.   REHAB POTENTIAL: Good  CLINICAL DECISION MAKING: Evolving/moderate complexity  EVALUATION COMPLEXITY: Low   GOALS:  SHORT TERM GOALS: Target date: 10/03/22  Ind with a HEP. Goal status: INITIAL    LONG TERM GOALS: Target date: 12/18/22  Ind with an advanced HEP.  Goal status: INITIAL  2.  Stand 20 minutes with pain not > 4/10.  Goal status: INITIAL  3.  Walk a community distance with pain not > 4/10.  Goal status: INITIAL  PLAN:  PT FREQUENCY: 2x/week  PT DURATION: 6 weeks  PLANNED INTERVENTIONS: Therapeutic exercises, Therapeutic activity, Patient/Family education, Self Care, Dry Needling, Electrical stimulation, Cryotherapy, Moist heat, Ultrasound, and Manual therapy.  PLAN FOR NEXT SESSION: Possible dry needling, Combo  e'stim/US at 1.50 W/CM2, STW/M. Core exercise progression, spinal protection techniques and body mechanics training.    Serene Kopf, [place], PT 09/19/2022, 2:11 PM

## 2022-09-20 ENCOUNTER — Telehealth: Payer: Self-pay

## 2022-09-20 ENCOUNTER — Telehealth: Payer: Self-pay | Admitting: *Deleted

## 2022-09-20 NOTE — Telephone Encounter (Signed)
   Name: Kiara Miller  DOB: [birth year]  MRN: [id number]  Primary Cardiologist: Bryan Lemma, MD   Preoperative team, please contact this patient and set up a phone call appointment for further preoperative risk assessment. Please obtain consent and complete medication review. Thank you for your help.  I confirm that guidance regarding antiplatelet and oral anticoagulation therapy has been completed and, if necessary, noted below.  Her Plavix may be held for 3-5 days prior to her procedure.  Please resume as soon as hemostasis is achieved.   Ronney Asters, NP 09/20/2022, 1:50 PM [place] [facility]

## 2022-09-20 NOTE — Telephone Encounter (Signed)
   Pre-operative Risk Assessment    Patient Name: Kiara Miller  DOB: 02-Mar-1950 MRN: [id number]      Request for Surgical Clearance    Procedure:   CAUDUAL ESI  Date of Surgery:  Clearance TBD                                 Surgeon:  NONE INDICTED Surgeon's Group or Practice Name:  [facility] [place] Phone number:  [phone] Fax number:  [phone]   Type of Clearance Requested:   - Pharmacy:  Hold Clopidogrel (Plavix) 7 DAYS PRIOR    Type of Anesthesia:  Not Indicated   Additional requests/questions:    SignedMichaelle Copas   09/20/2022, 1:21 PM

## 2022-09-20 NOTE — Telephone Encounter (Signed)
PROVIDER FOR PROCEDURE IS: DR. O'TOOLE

## 2022-09-20 NOTE — Telephone Encounter (Signed)
I s/w the pt and she has been scheduled for tele pre op appt 09/24/22 @ 2:40. Med rec and consent are done. Pt states her procedure is set for 10/02/22, though our office recv'd request TBD.

## 2022-09-20 NOTE — Telephone Encounter (Signed)
I s/w the pt and she has been scheduled for tele pre op appt 09/24/22 @ 2:40. Med rec and consent are done. Pt states her procedure is set for 10/02/22, though our office recv'd request TBD.      Patient Consent for Virtual Visit        Kiara Miller has provided verbal consent on 09/20/2022 for a virtual visit (video or telephone).   CONSENT FOR VIRTUAL VISIT FOR:  Kiara Miller  By participating in this virtual visit I agree to the following:  I hereby voluntarily request, consent and authorize [facility] and its employed or contracted physicians, physician assistants, nurse practitioners or other licensed health care professionals (the Practitioner), to provide me with telemedicine health care services (the "Services") as deemed necessary by the treating Practitioner. I acknowledge and consent to receive the Services by the Practitioner via telemedicine. I understand that the telemedicine visit will involve communicating with the Practitioner through live audiovisual communication technology and the disclosure of certain medical information by electronic transmission. I acknowledge that I have been given the opportunity to request an in-person assessment or other available alternative prior to the telemedicine visit and am voluntarily participating in the telemedicine visit.  I understand that I have the right to withhold or withdraw my consent to the use of telemedicine in the course of my care at any time, without affecting my right to future care or treatment, and that the Practitioner or I may terminate the telemedicine visit at any time. I understand that I have the right to inspect all information obtained and/or recorded in the course of the telemedicine visit and may receive copies of available information for a reasonable fee.  I understand that some of the potential risks of receiving the Services via telemedicine include:  Delay or interruption in medical evaluation due to  technological equipment failure or disruption; Information transmitted may not be sufficient (e.g. poor resolution of images) to allow for appropriate medical decision making by the Practitioner; and/or  In rare instances, security protocols could fail, causing a breach of personal health information.  Furthermore, I acknowledge that it is my responsibility to provide information about my medical history, conditions and care that is complete and accurate to the best of my ability. I acknowledge that Practitioner's advice, recommendations, and/or decision may be based on factors not within their control, such as incomplete or inaccurate data provided by me or distortions of diagnostic images or specimens that may result from electronic transmissions. I understand that the practice of medicine is not an exact science and that Practitioner makes no warranties or guarantees regarding treatment outcomes. I acknowledge that a copy of this consent can be made available to me via my patient portal [facility] MyChart), or I can request a printed copy by calling the office of [facility].    I understand that my insurance will be billed for this visit.   I have read or had this consent read to me. I understand the contents of this consent, which adequately explains the benefits and risks of the Services being provided via telemedicine.  I have been provided ample opportunity to ask questions regarding this consent and the Services and have had my questions answered to my satisfaction. I give my informed consent for the services to be provided through the use of telemedicine in my medical care

## 2022-09-24 ENCOUNTER — Ambulatory Visit: Payer: Medicare Other | Attending: Cardiology

## 2022-09-24 ENCOUNTER — Ambulatory Visit: Payer: Medicare Other | Admitting: Physical Therapy

## 2022-09-24 DIAGNOSIS — M62830 Muscle spasm of back: Secondary | ICD-10-CM

## 2022-09-24 DIAGNOSIS — G8929 Other chronic pain: Secondary | ICD-10-CM

## 2022-09-24 DIAGNOSIS — M5459 Other low back pain: Secondary | ICD-10-CM

## 2022-09-24 DIAGNOSIS — Z01810 Encounter for preprocedural cardiovascular examination: Secondary | ICD-10-CM

## 2022-09-24 NOTE — Therapy (Signed)
OUTPATIENT PHYSICAL THERAPY THORACOLUMBAR EVALUATION   Patient Name: Kiara Miller MRN: [id number] DOB:19-Aug-1950, 72 y.o., female Today's Date: 09/24/2022  END OF SESSION:  PT End of Session - 09/24/22 1602     Visit Number 2    Number of Visits 12    Date for PT Re-Evaluation 12/18/22    Authorization Type FOTO.    PT Start Time 0315    PT Stop Time 0410    PT Time Calculation (min) 55 min    Activity Tolerance Patient tolerated treatment well    Behavior During Therapy [facility] for tasks assessed/performed             Past Medical History:  Diagnosis Date   CAD S/P percutaneous coronary angioplasty 10/05/2019   Cardiac cath revealed 85% mid LAD between SP1 and D1 (DES PCI with resolute Onyx DES 2.5 mm x 15 mm-negative for 8 mm); extensive moderate CT FFR and DFR negative proximal to mid RCA 60% and mid focal 45% as well as proximal to mid LCx 60% eccentric.  Both DFR was 0.97   Diverticulitis 05/2015   Hyperlipidemia    Hypertension    Joint pain    Osteoarthritis    Polyarthralgia    Rheumatoid arthritis (HCC)    Right knee pain    Past Surgical History:  Procedure Laterality Date   ABDOMINAL HYSTERECTOMY     CARPAL TUNNEL RELEASE     CORONARY STENT INTERVENTION N/A 10/05/2019   Procedure: CORONARY STENT INTERVENTION;  Surgeon: Marykay Lex, MD;  Location: [facility] INVASIVE CV LAB;  Service: Cardiovascular;  Laterality: N/A;; 85% mid LAD irregular lesion.  DES PCI reducing to 0% with resolute Onyx DES 2.5 mm x15 mm--2.8 mm.   KNEE ARTHROPLASTY     LEFT HEART CATH AND CORONARY ANGIOGRAPHY N/A 10/05/2019   Procedure: LEFT HEART CATH AND CORONARY ANGIOGRAPHY;  Surgeon: Marykay Lex, MD;  Location: [facility] INVASIVE CV LAB;  Service: Cardiovascular;: Mid LAD irregular 85% (DES PCI).  Extensive 60 to 65% proximal to mid RCA followed by 45% mid-distal RCA--DFR negative (0.97).  Proximal-mid LCx 60% eccentric lesion-DFR 0.97.  (Positive for DFR is<0.9)   MANDIBLE SURGERY     NM  MYOVIEW LTD  06/2017   (Ordered for coronary calcium score 376) LOW RISK.  EF 72%.  No ischemia or infarction.  No wall motion normality.  No change from previous.   OSTEOTOMY PELVIS BILATERAL     TONSILLECTOMY     TONSILLECTOMY     TOTAL KNEE ARTHROPLASTY Right 09/02/2018   Procedure: TOTAL KNEE ARTHROPLASTY;  Surgeon: Durene Romans, MD;  Location: [facility];  Service: Orthopedics;  Laterality: Right;  70 mins   TRANSTHORACIC ECHOCARDIOGRAM  12/2010   EF 65-70%. Mild LVH. No RWMA/ Gr1 DD.   TUBAL LIGATION     WRIST FUSION     Patient Active Problem List   Diagnosis Date Noted   Senile purpura (HCC) 03/26/2022   Hyperlipidemia LDL goal <70 10/27/2019   Progressive angina (HCC) 10/05/2019   Abnormal cardiac CT angiography 10/05/2019   CAD S/P percutaneous coronary angioplasty 10/05/2019   Depression 04/09/2019   Obese 09/03/2018   S/P right TKA 09/02/2018   PAC (premature atrial contraction) 07/11/2017   History of hypothyroidism 06/11/2017   Essential hypertension 06/11/2017   History of obesity 06/11/2017   S/P right knee arthroscopy 06/11/2017   DDD (degenerative disc disease), cervical 06/11/2017   DDD (degenerative disc disease), lumbar 06/11/2017   History of carpal tunnel surgery  of right wrist 06/11/2017   Primary osteoarthritis of both knees 06/11/2017   PVC's (premature ventricular contractions) 05/03/2017   Agatston coronary artery calcium score between 200 and 399 05/03/2017   Rheumatoid arthritis of multiple sites without rheumatoid factor (HCC) 10/10/2015   Uveitis 10/10/2015   Diverticulitis 10/10/2015   Palpitations 12/05/2010    REFERRING PROVIDER: Linda Hedges Dubicki PA-C  REFERRING DIAG: Lumbar spondylosis  Rationale for Evaluation and Treatment: Rehabilitation  THERAPY DIAG:  Other low back pain  Muscle spasm of back  Chronic bilateral low back pain with left-sided sciatica  ONSET DATE: "Years."  SUBJECTIVE:                                                                                                                                                                                            SUBJECTIVE STATEMENT: No new complaints.  PERTINENT HISTORY:  See above.  PAIN:  Are you having pain? Yes: NPRS scale: 6/10 Pain location: Across low back and right > left. Pain description: Ache. Aggravating factors: Standing and walking. Relieving factors: Rest.  PRECAUTIONS: None  RED FLAGS: None   WEIGHT BEARING RESTRICTIONS: No  FALLS:  Has patient fallen in last 6 months? No  LIVING ENVIRONMENT: Lives with: lives with their spouse Lives in: House/apartment Has following equipment at home: None  OCCUPATION: Retired.  PLOF: Independent with basic ADLs  PATIENT GOALS: Decrease pain and do more.  Improve stamina.    OBJECTIVE:   DIAGNOSTIC FINDINGS:  Lumbar spondylosis.  There is facet arthropathy at L4-5 with degenerative spondylolisthesis. There appears to be ankylosis of the facet joints.  There is no significant spinal stenosis.   PATIENT SURVEYS:  FOTO .  POSTURE: rounded shoulders and forward head  PALPATION: C/o pain across her lower lumbar region and very tender to palpation over her right SIJ.  LUMBAR ROM:  Active lumbar flexion is limited by 50% and extension is 20 degrees.   LOWER EXTREMITY MMT:   Normal LE strength.  LUMBAR SPECIAL TESTS:  Equal leg lengths. (-) SLR testing.   GAIT: Slow and cautious in obvious pain without assistive device.  TODAY'S TREATMENT:  DATE: 09/24/22:  Patient in left SDLY position with folded pillow between knees for comfort:  Performed combo e'stim/US at 1.50 W/CM2 x 12 minutes f/b STW/M x 11 minutes f/b HMP and IFC at 80-150 Hz on 40% scan x 20 minutes to patient's right low back.  Normal modality response following removal of  modality.  PATIENT EDUCATION:  Education details: Discussed ry needling and provided patient with a consent form. Person educated: Patient Education method: Explanation Education comprehension: verbalized understanding  HOME EXERCISE PROGRAM:   ASSESSMENT:  CLINICAL IMPRESSION: Patient very tender over right SIJ.  She tolerated treatment well today.  We discussed dry needling and plan to proceed with this next visit.  OBJECTIVE IMPAIRMENTS: Abnormal gait, decreased activity tolerance, decreased ROM, increased muscle spasms, and pain.   ACTIVITY LIMITATIONS: carrying, lifting, bending, standing, and locomotion level  PARTICIPATION LIMITATIONS: meal prep, cleaning, laundry, community activity, and yard work  PERSONAL FACTORS: Time since onset of injury/illness/exacerbation are also affecting patient's functional outcome.   REHAB POTENTIAL: Good  CLINICAL DECISION MAKING: Evolving/moderate complexity  EVALUATION COMPLEXITY: Low   GOALS:  SHORT TERM GOALS: Target date: 10/03/22  Ind with a HEP. Goal status: INITIAL    LONG TERM GOALS: Target date: 12/18/22  Ind with an advanced HEP.  Goal status: INITIAL  2.  Stand 20 minutes with pain not > 4/10.  Goal status: INITIAL  3.  Walk a community distance with pain not > 4/10.  Goal status: INITIAL  PLAN:  PT FREQUENCY: 2x/week  PT DURATION: 6 weeks  PLANNED INTERVENTIONS: Therapeutic exercises, Therapeutic activity, Patient/Family education, Self Care, Dry Needling, Electrical stimulation, Cryotherapy, Moist heat, Ultrasound, and Manual therapy.  PLAN FOR NEXT SESSION: Possible dry needling, Combo e'stim/US at 1.50 W/CM2, STW/M. Core exercise progression, spinal protection techniques and body mechanics training.    Kule Gascoigne, [place], PT 09/24/2022, 4:27 PM

## 2022-09-24 NOTE — Progress Notes (Signed)
Virtual Visit via Telephone Note   Because of Kiara Miller's co-morbid illnesses, she is at least at moderate risk for complications without adequate follow up.  This format is felt to be most appropriate for this patient at this time.  The patient did not have access to video technology/had technical difficulties with video requiring transitioning to audio format only (telephone).  All issues noted in this document were discussed and addressed.  No physical exam could be performed with this format.  Please refer to the patient's chart for her consent to telehealth for [facility].  Evaluation Performed:  Preoperative cardiovascular risk assessment _____________   Date:  09/24/2022   Patient ID:  Kiara Miller, DOB 1950-12-14, MRN [id number] Patient Location:  Home Provider location:   Office  Primary Care Provider:  Tracey Harries, MD Primary Cardiologist:  Bryan Lemma, MD  Chief Complaint / Patient Profile   72 y.o. y/o female with a h/o coronary artery disease, hyperlipidemia, PACs and PVCs who is pending ESI and presents today for telephonic preoperative cardiovascular risk assessment.  History of Present Illness    Kiara Miller is a 72 y.o. female who presents via audio/video conferencing for a telehealth visit today.  Pt was last seen in cardiology clinic on 03/16/22  by Dr. Herbie Baltimore.  At that time Kiara Miller was doing well .  The patient is now pending procedure as outlined above. Since her last visit, she continues to be stable from a cardiac standpoint.  Today she denies chest pain, shortness of breath, lower extremity edema, fatigue, palpitations, melena, hematuria, hemoptysis, diaphoresis, weakness, presyncope, syncope, orthopnea, and PND.   Past Medical History    Past Medical History:  Diagnosis Date   CAD S/P percutaneous coronary angioplasty 10/05/2019   Cardiac cath revealed 85% mid LAD between SP1 and D1 (DES PCI with resolute Onyx DES 2.5 mm x  15 mm-negative for 8 mm); extensive moderate CT FFR and DFR negative proximal to mid RCA 60% and mid focal 45% as well as proximal to mid LCx 60% eccentric.  Both DFR was 0.97   Diverticulitis 05/2015   Hyperlipidemia    Hypertension    Joint pain    Osteoarthritis    Polyarthralgia    Rheumatoid arthritis (HCC)    Right knee pain    Past Surgical History:  Procedure Laterality Date   ABDOMINAL HYSTERECTOMY     CARPAL TUNNEL RELEASE     CORONARY STENT INTERVENTION N/A 10/05/2019   Procedure: CORONARY STENT INTERVENTION;  Surgeon: Marykay Lex, MD;  Location: [facility] INVASIVE CV LAB;  Service: Cardiovascular;  Laterality: N/A;; 85% mid LAD irregular lesion.  DES PCI reducing to 0% with resolute Onyx DES 2.5 mm x15 mm--2.8 mm.   KNEE ARTHROPLASTY     LEFT HEART CATH AND CORONARY ANGIOGRAPHY N/A 10/05/2019   Procedure: LEFT HEART CATH AND CORONARY ANGIOGRAPHY;  Surgeon: Marykay Lex, MD;  Location: [facility] INVASIVE CV LAB;  Service: Cardiovascular;: Mid LAD irregular 85% (DES PCI).  Extensive 60 to 65% proximal to mid RCA followed by 45% mid-distal RCA--DFR negative (0.97).  Proximal-mid LCx 60% eccentric lesion-DFR 0.97.  (Positive for DFR is<0.9)   MANDIBLE SURGERY     NM MYOVIEW LTD  06/2017   (Ordered for coronary calcium score 376) LOW RISK.  EF 72%.  No ischemia or infarction.  No wall motion normality.  No change from previous.   OSTEOTOMY PELVIS BILATERAL     TONSILLECTOMY  TONSILLECTOMY     TOTAL KNEE ARTHROPLASTY Right 09/02/2018   Procedure: TOTAL KNEE ARTHROPLASTY;  Surgeon: Durene Romans, MD;  Location: [facility];  Service: Orthopedics;  Laterality: Right;  70 mins   TRANSTHORACIC ECHOCARDIOGRAM  12/2010   EF 65-70%. Mild LVH. No RWMA/ Gr1 DD.   TUBAL LIGATION     WRIST FUSION      Allergies  Allergies  Allergen Reactions   Amoxicillin-Pot Clavulanate Other (See Comments)   Clindamycin/Lincomycin Other (See Comments)    Severe esophagitis   Codeine Itching    Hydrocodone    Leflunomide Nausea Only   Penicillins Itching    Did it involve swelling of the face/tongue/throat, SOB, or low BP? No Did it involve sudden or severe rash/hives, skin peeling, or any reaction on the inside of your mouth or nose? No Did you need to seek medical attention at a hospital or doctor's office? No When did it last happen?      2010 If all above answers are "NO", may proceed with cephalosporin use.    Statins     Muscle weakness   Sulfamethoxazole-Trimethoprim Other (See Comments)   Tetracyclines & Related Nausea And Vomiting   Doxycycline Rash   Levofloxacin Nausea Only and Palpitations    Home Medications    Prior to Admission medications   Medication Sig Start Date End Date Taking? Authorizing Provider  azelastine (ASTELIN) 0.1 % nasal spray Place 2 sprays into both nostrils as needed for allergies. 05/03/21   [provider]  Cholecalciferol (VITAMIN D3) 1.25 MG (50000 UT) CAPS Take 1 capsule by mouth once a week.    [provider]  clopidogrel (PLAVIX) 75 MG tablet TAKE 1 TABLET DAILY 06/25/22   Marykay Lex, MD  escitalopram (LEXAPRO) 20 MG tablet Take 1 tablet by mouth daily. 06/20/20   [provider]  fluticasone (FLONASE) 50 MCG/ACT nasal spray Place 2 sprays into both nostrils as needed for allergies. 04/05/21   [provider]  gabapentin (NEURONTIN) 300 MG capsule Take 300 mg by mouth daily at 6 (six) AM. 10/05/20   [provider]  ibuprofen (ADVIL) 200 MG tablet Take 800 mg by mouth 2 (two) times daily as needed for headache or moderate pain.    [provider]  ketoconazole (NIZORAL) 2 % cream Apply 1 application  topically daily as needed for irritation.    [provider]  levothyroxine (SYNTHROID, LEVOTHROID) 75 MCG tablet Take 75 mcg by mouth daily before breakfast.    [provider]  losartan-hydrochlorothiazide (HYZAAR) 100-25 MG per tablet Take 1 tablet by mouth daily.  12/05/10   Nahser, Deloris Ping, MD  metoprolol succinate (TOPROL-XL) 25 MG 24 hr tablet TAKE 1/2 TABLET ONCE DAILY 06/25/22   Marykay Lex, MD  montelukast (SINGULAIR) 10 MG tablet Take 10 mg by mouth at bedtime.    [provider]  nitroGLYCERIN (NITROSTAT) 0.4 MG SL tablet Place 1 tablet (0.4 mg total) under the tongue every 5 (five) minutes as needed for chest pain. Call EMS if CP is not relieved after 2 doses. You may take up to 3 doses. 09/30/19 01/23/21  Marykay Lex, MD  PRALUENT 150 MG/ML SOAJ INJECT 150 MG into THE SKIN EVERY 14 DAYS 08/20/22   Marykay Lex, MD  tiZANidine (ZANAFLEX) 2 MG tablet Take 2 mg by mouth as needed for muscle spasms. 10/20/20   [provider]    Physical Exam    Vital Signs:  Kiara Miller  Kiara Miller does not have vital signs available for review today.  Given telephonic nature of communication, physical exam is limited. AAOx3. NAD. Normal affect.  Speech and respirations are unlabored.  Accessory Clinical Findings    None  Assessment & Plan    1.  Preoperative Cardiovascular Risk Assessment:CAUDUAL ESI , [facility] [place] , [phone]       Primary Cardiologist: Bryan Lemma, MD  Chart reviewed as part of pre-operative protocol coverage. Given past medical history and time since last visit, based on ACC/AHA guidelines, Kiara Miller would be at acceptable risk for the planned procedure without further cardiovascular testing.   Her Plavix may be held for 3-5 days prior to her procedure. Please resume as soon as hemostasis is achieved.   Patient was advised that if she develops new symptoms prior to surgery to contact our office to arrange a follow-up appointment.  She verbalized understanding.  I will route this recommendation to the requesting party via Epic fax function and remove from pre-op pool.  Please call with questions.      Time:   Today, I have spent 5 minutes with the patient with  telehealth technology discussing medical history, symptoms, and management plan.  Prior to her phone evaluation I spent greater than 10 minutes reviewing her past medical history and cardiac medications.   Ronney Asters, NP  09/24/2022, 7:56 AM

## 2022-09-27 ENCOUNTER — Ambulatory Visit: Payer: Medicare Other | Admitting: Physical Therapy

## 2022-09-27 DIAGNOSIS — M5459 Other low back pain: Secondary | ICD-10-CM

## 2022-09-27 DIAGNOSIS — G8929 Other chronic pain: Secondary | ICD-10-CM

## 2022-09-27 DIAGNOSIS — M62830 Muscle spasm of back: Secondary | ICD-10-CM

## 2022-09-27 NOTE — Therapy (Signed)
OUTPATIENT PHYSICAL THERAPY THORACOLUMBAR EVALUATION   Patient Name: Kiara Miller MRN: [id number] DOB:July 12, 1950, 72 y.o., female Today's Date: 09/27/2022  END OF SESSION:  PT End of Session - 09/27/22 1512     Visit Number 3    Number of Visits 12    Date for PT Re-Evaluation 12/18/22    Authorization Type FOTO.    PT Start Time 0230    PT Stop Time 0328    PT Time Calculation (min) 58 min    Activity Tolerance Patient tolerated treatment well    Behavior During Therapy [facility] for tasks assessed/performed             Past Medical History:  Diagnosis Date   CAD S/P percutaneous coronary angioplasty 10/05/2019   Cardiac cath revealed 85% mid LAD between SP1 and D1 (DES PCI with resolute Onyx DES 2.5 mm x 15 mm-negative for 8 mm); extensive moderate CT FFR and DFR negative proximal to mid RCA 60% and mid focal 45% as well as proximal to mid LCx 60% eccentric.  Both DFR was 0.97   Diverticulitis 05/2015   Hyperlipidemia    Hypertension    Joint pain    Osteoarthritis    Polyarthralgia    Rheumatoid arthritis (HCC)    Right knee pain    Past Surgical History:  Procedure Laterality Date   ABDOMINAL HYSTERECTOMY     CARPAL TUNNEL RELEASE     CORONARY STENT INTERVENTION N/A 10/05/2019   Procedure: CORONARY STENT INTERVENTION;  Surgeon: Marykay Lex, MD;  Location: [facility] INVASIVE CV LAB;  Service: Cardiovascular;  Laterality: N/A;; 85% mid LAD irregular lesion.  DES PCI reducing to 0% with resolute Onyx DES 2.5 mm x15 mm--2.8 mm.   KNEE ARTHROPLASTY     LEFT HEART CATH AND CORONARY ANGIOGRAPHY N/A 10/05/2019   Procedure: LEFT HEART CATH AND CORONARY ANGIOGRAPHY;  Surgeon: Marykay Lex, MD;  Location: [facility] INVASIVE CV LAB;  Service: Cardiovascular;: Mid LAD irregular 85% (DES PCI).  Extensive 60 to 65% proximal to mid RCA followed by 45% mid-distal RCA--DFR negative (0.97).  Proximal-mid LCx 60% eccentric lesion-DFR 0.97.  (Positive for DFR is<0.9)   MANDIBLE SURGERY     NM  MYOVIEW LTD  06/2017   (Ordered for coronary calcium score 376) LOW RISK.  EF 72%.  No ischemia or infarction.  No wall motion normality.  No change from previous.   OSTEOTOMY PELVIS BILATERAL     TONSILLECTOMY     TONSILLECTOMY     TOTAL KNEE ARTHROPLASTY Right 09/02/2018   Procedure: TOTAL KNEE ARTHROPLASTY;  Surgeon: Durene Romans, MD;  Location: [facility];  Service: Orthopedics;  Laterality: Right;  70 mins   TRANSTHORACIC ECHOCARDIOGRAM  12/2010   EF 65-70%. Mild LVH. No RWMA/ Gr1 DD.   TUBAL LIGATION     WRIST FUSION     Patient Active Problem List   Diagnosis Date Noted   Senile purpura (HCC) 03/26/2022   Hyperlipidemia LDL goal <70 10/27/2019   Progressive angina (HCC) 10/05/2019   Abnormal cardiac CT angiography 10/05/2019   CAD S/P percutaneous coronary angioplasty 10/05/2019   Depression 04/09/2019   Obese 09/03/2018   S/P right TKA 09/02/2018   PAC (premature atrial contraction) 07/11/2017   History of hypothyroidism 06/11/2017   Essential hypertension 06/11/2017   History of obesity 06/11/2017   S/P right knee arthroscopy 06/11/2017   DDD (degenerative disc disease), cervical 06/11/2017   DDD (degenerative disc disease), lumbar 06/11/2017   History of carpal tunnel surgery  of right wrist 06/11/2017   Primary osteoarthritis of both knees 06/11/2017   PVC's (premature ventricular contractions) 05/03/2017   Agatston coronary artery calcium score between 200 and 399 05/03/2017   Rheumatoid arthritis of multiple sites without rheumatoid factor (HCC) 10/10/2015   Uveitis 10/10/2015   Diverticulitis 10/10/2015   Palpitations 12/05/2010    REFERRING PROVIDER: Linda Hedges Dubicki PA-C  REFERRING DIAG: Lumbar spondylosis  Rationale for Evaluation and Treatment: Rehabilitation  THERAPY DIAG:  Other low back pain  Muscle spasm of back  Chronic bilateral low back pain with left-sided sciatica  ONSET DATE: "Years."  SUBJECTIVE:                                                                                                                                                                                            SUBJECTIVE STATEMENT: Pain at an 8. PERTINENT HISTORY:  See above.  PAIN:  Are you having pain? Yes: NPRS scale: 8/10 Pain location: Across low back and right > left. Pain description: Ache. Aggravating factors: Standing and walking. Relieving factors: Rest.  PRECAUTIONS: None  RED FLAGS: None   WEIGHT BEARING RESTRICTIONS: No  FALLS:  Has patient fallen in last 6 months? No  LIVING ENVIRONMENT: Lives with: lives with their spouse Lives in: House/apartment Has following equipment at home: None  OCCUPATION: Retired.  PLOF: Independent with basic ADLs  PATIENT GOALS: Decrease pain and do more.  Improve stamina.    OBJECTIVE:   DIAGNOSTIC FINDINGS:  Lumbar spondylosis.  There is facet arthropathy at L4-5 with degenerative spondylolisthesis. There appears to be ankylosis of the facet joints.  There is no significant spinal stenosis.   PATIENT SURVEYS:  FOTO .  POSTURE: rounded shoulders and forward head  PALPATION: C/o pain across her lower lumbar region and very tender to palpation over her right SIJ.  LUMBAR ROM:  Active lumbar flexion is limited by 50% and extension is 20 degrees.   LOWER EXTREMITY MMT:   Normal LE strength.  LUMBAR SPECIAL TESTS:  Equal leg lengths. (-) SLR testing.   GAIT: Slow and cautious in obvious pain without assistive device.  TODAY'S TREATMENT:  DATE: 09/27/22:  Nustep level 3 x 5 minutes and level 1 x 5 minutes f/b Trigger Point Dry-Needling  Treatment instructions: Expect mild to moderate muscle soreness. S/S of pneumothorax if dry needled over a lung field, and to seek immediate medical attention should they occur. Patient verbalized understanding of these  instructions and education. Patient Consent Given: Yes Education handout provided: Yes Muscles treated: Proximal gluteal musculature Treatment response/outcome: Twitch  Patient in left SDLY position with folded pillow between knees for comfort:  f/b STW/M x 13 minutes f/b HMP and IFC at 80-150 Hz on 40% scan x 20 minutes to patient's right low back.  Normal modality response following removal of modality.  PATIENT EDUCATION:  Education details: Discussed dry needling and provided patient with a consent form. Person educated: Patient Education method: Explanation Education comprehension: verbalized understanding  HOME EXERCISE PROGRAM:   ASSESSMENT:  CLINICAL IMPRESSION: Patient tolerated dry needling very well.  However, post-treatment she felt about the same.  OBJECTIVE IMPAIRMENTS: Abnormal gait, decreased activity tolerance, decreased ROM, increased muscle spasms, and pain.   ACTIVITY LIMITATIONS: carrying, lifting, bending, standing, and locomotion level  PARTICIPATION LIMITATIONS: meal prep, cleaning, laundry, community activity, and yard work  PERSONAL FACTORS: Time since onset of injury/illness/exacerbation are also affecting patient's functional outcome.   REHAB POTENTIAL: Good  CLINICAL DECISION MAKING: Evolving/moderate complexity  EVALUATION COMPLEXITY: Low   GOALS:  SHORT TERM GOALS: Target date: 10/03/22  Ind with a HEP. Goal status: INITIAL    LONG TERM GOALS: Target date: 12/18/22  Ind with an advanced HEP.  Goal status: INITIAL  2.  Stand 20 minutes with pain not > 4/10.  Goal status: INITIAL  3.  Walk a community distance with pain not > 4/10.  Goal status: INITIAL  PLAN:  PT FREQUENCY: 2x/week  PT DURATION: 6 weeks  PLANNED INTERVENTIONS: Therapeutic exercises, Therapeutic activity, Patient/Family education, Self Care, Dry Needling, Electrical stimulation, Cryotherapy, Moist heat, Ultrasound, and Manual therapy.  PLAN FOR NEXT SESSION:  Possible dry needling, Combo e'stim/US at 1.50 W/CM2, STW/M. Core exercise progression, spinal protection techniques and body mechanics training.    Marrah Vanevery, [place], PT 09/27/2022, 3:55 PM

## 2022-10-04 ENCOUNTER — Encounter: Payer: Medicare Other | Admitting: Physical Therapy

## 2022-10-09 ENCOUNTER — Ambulatory Visit: Payer: Medicare Other | Attending: Physician Assistant | Admitting: Physical Therapy

## 2022-10-09 DIAGNOSIS — G8929 Other chronic pain: Secondary | ICD-10-CM | POA: Insufficient documentation

## 2022-10-09 DIAGNOSIS — M5459 Other low back pain: Secondary | ICD-10-CM | POA: Insufficient documentation

## 2022-10-09 DIAGNOSIS — M5442 Lumbago with sciatica, left side: Secondary | ICD-10-CM | POA: Insufficient documentation

## 2022-10-09 DIAGNOSIS — M62830 Muscle spasm of back: Secondary | ICD-10-CM | POA: Insufficient documentation

## 2022-10-09 NOTE — Therapy (Signed)
OUTPATIENT PHYSICAL THERAPY THORACOLUMBAR EVALUATION   Patient Name: Kiara Miller MRN: [id number] DOB:06-15-1950, 72 y.o., female Today's Date: 10/09/2022  END OF SESSION:  PT End of Session - 10/09/22 1402     Visit Number 4    Number of Visits 12    Date for PT Re-Evaluation 12/18/22    Authorization Type FOTO.    PT Start Time 0148    PT Stop Time 0239    PT Time Calculation (min) 51 min    Activity Tolerance Patient tolerated treatment well    Behavior During Therapy [facility] for tasks assessed/performed             Past Medical History:  Diagnosis Date   CAD S/P percutaneous coronary angioplasty 10/05/2019   Cardiac cath revealed 85% mid LAD between SP1 and D1 (DES PCI with resolute Onyx DES 2.5 mm x 15 mm-negative for 8 mm); extensive moderate CT FFR and DFR negative proximal to mid RCA 60% and mid focal 45% as well as proximal to mid LCx 60% eccentric.  Both DFR was 0.97   Diverticulitis 05/2015   Hyperlipidemia    Hypertension    Joint pain    Osteoarthritis    Polyarthralgia    Rheumatoid arthritis (HCC)    Right knee pain    Past Surgical History:  Procedure Laterality Date   ABDOMINAL HYSTERECTOMY     CARPAL TUNNEL RELEASE     CORONARY STENT INTERVENTION N/A 10/05/2019   Procedure: CORONARY STENT INTERVENTION;  Surgeon: Marykay Lex, MD;  Location: [facility] INVASIVE CV LAB;  Service: Cardiovascular;  Laterality: N/A;; 85% mid LAD irregular lesion.  DES PCI reducing to 0% with resolute Onyx DES 2.5 mm x15 mm--2.8 mm.   KNEE ARTHROPLASTY     LEFT HEART CATH AND CORONARY ANGIOGRAPHY N/A 10/05/2019   Procedure: LEFT HEART CATH AND CORONARY ANGIOGRAPHY;  Surgeon: Marykay Lex, MD;  Location: [facility] INVASIVE CV LAB;  Service: Cardiovascular;: Mid LAD irregular 85% (DES PCI).  Extensive 60 to 65% proximal to mid RCA followed by 45% mid-distal RCA--DFR negative (0.97).  Proximal-mid LCx 60% eccentric lesion-DFR 0.97.  (Positive for DFR is<0.9)   MANDIBLE SURGERY     NM  MYOVIEW LTD  06/2017   (Ordered for coronary calcium score 376) LOW RISK.  EF 72%.  No ischemia or infarction.  No wall motion normality.  No change from previous.   OSTEOTOMY PELVIS BILATERAL     TONSILLECTOMY     TONSILLECTOMY     TOTAL KNEE ARTHROPLASTY Right 09/02/2018   Procedure: TOTAL KNEE ARTHROPLASTY;  Surgeon: Durene Romans, MD;  Location: [facility];  Service: Orthopedics;  Laterality: Right;  70 mins   TRANSTHORACIC ECHOCARDIOGRAM  12/2010   EF 65-70%. Mild LVH. No RWMA/ Gr1 DD.   TUBAL LIGATION     WRIST FUSION     Patient Active Problem List   Diagnosis Date Noted   Senile purpura (HCC) 03/26/2022   Hyperlipidemia LDL goal <70 10/27/2019   Progressive angina (HCC) 10/05/2019   Abnormal cardiac CT angiography 10/05/2019   CAD S/P percutaneous coronary angioplasty 10/05/2019   Depression 04/09/2019   Obese 09/03/2018   S/P right TKA 09/02/2018   PAC (premature atrial contraction) 07/11/2017   History of hypothyroidism 06/11/2017   Essential hypertension 06/11/2017   History of obesity 06/11/2017   S/P right knee arthroscopy 06/11/2017   DDD (degenerative disc disease), cervical 06/11/2017   DDD (degenerative disc disease), lumbar 06/11/2017   History of carpal tunnel surgery  of right wrist 06/11/2017   Primary osteoarthritis of both knees 06/11/2017   PVC's (premature ventricular contractions) 05/03/2017   Agatston coronary artery calcium score between 200 and 399 05/03/2017   Rheumatoid arthritis of multiple sites without rheumatoid factor (HCC) 10/10/2015   Uveitis 10/10/2015   Diverticulitis 10/10/2015   Palpitations 12/05/2010    REFERRING PROVIDER: Linda Hedges Dubicki PA-C  REFERRING DIAG: Lumbar spondylosis  Rationale for Evaluation and Treatment: Rehabilitation  THERAPY DIAG:  Other low back pain  Muscle spasm of back  Chronic bilateral low back pain with left-sided sciatica  ONSET DATE: "Years."  SUBJECTIVE:                                                                                                                                                                                            SUBJECTIVE STATEMENT: Pian down to a 4.  Walking longer with less pain.  She attributes this to an injection from Dr. Laurian Brim on 10/02/22. PERTINENT HISTORY:  See above.  PAIN:  Are you having pain? Yes: NPRS scale: 4/10 Pain location: Across low back and right > left. Pain description: Ache. Aggravating factors: Standing and walking. Relieving factors: Rest.  PRECAUTIONS: None  RED FLAGS: None   WEIGHT BEARING RESTRICTIONS: No  FALLS:  Has patient fallen in last 6 months? No  LIVING ENVIRONMENT: Lives with: lives with their spouse Lives in: House/apartment Has following equipment at home: None  OCCUPATION: Retired.  PLOF: Independent with basic ADLs  PATIENT GOALS: Decrease pain and do more.  Improve stamina.    OBJECTIVE:   DIAGNOSTIC FINDINGS:  Lumbar spondylosis.  There is facet arthropathy at L4-5 with degenerative spondylolisthesis. There appears to be ankylosis of the facet joints.  There is no significant spinal stenosis.   PATIENT SURVEYS:  FOTO .  POSTURE: rounded shoulders and forward head  PALPATION: C/o pain across her lower lumbar region and very tender to palpation over her right SIJ.  LUMBAR ROM:  Active lumbar flexion is limited by 50% and extension is 20 degrees.   LOWER EXTREMITY MMT:   Normal LE strength.  LUMBAR SPECIAL TESTS:  Equal leg lengths. (-) SLR testing.   GAIT: Slow and cautious in obvious pain without assistive device.  TODAY'S TREATMENT:  DATE: 09/27/22:  Nustep level 3 x 15 minutes f/b patient in left SDLY position with folded pillow between knees for comfort:  f/b STW/M x 8 minutes f/b HMP and IFC at 80-150 Hz on 40% scan x 15 minutes to patient's right  low back.  Normal modality response following removal of modality.  PATIENT EDUCATION:  Education details: Discussed dry needling and provided patient with a consent form. Person educated: Patient Education method: Explanation Education comprehension: verbalized understanding  HOME EXERCISE PROGRAM:   ASSESSMENT:  CLINICAL IMPRESSION: Patient reporting a good response from an injection on 10/02/22.  She did great with Nustep exercise with an increase in resistance and time.    OBJECTIVE IMPAIRMENTS: Abnormal gait, decreased activity tolerance, decreased ROM, increased muscle spasms, and pain.   ACTIVITY LIMITATIONS: carrying, lifting, bending, standing, and locomotion level  PARTICIPATION LIMITATIONS: meal prep, cleaning, laundry, community activity, and yard work  PERSONAL FACTORS: Time since onset of injury/illness/exacerbation are also affecting patient's functional outcome.   REHAB POTENTIAL: Good  CLINICAL DECISION MAKING: Evolving/moderate complexity  EVALUATION COMPLEXITY: Low   GOALS:  SHORT TERM GOALS: Target date: 10/03/22  Ind with a HEP. Goal status: INITIAL    LONG TERM GOALS: Target date: 12/18/22  Ind with an advanced HEP.  Goal status: INITIAL  2.  Stand 20 minutes with pain not > 4/10.  Goal status: INITIAL  3.  Walk a community distance with pain not > 4/10.  Goal status: INITIAL  PLAN:  PT FREQUENCY: 2x/week  PT DURATION: 6 weeks  PLANNED INTERVENTIONS: Therapeutic exercises, Therapeutic activity, Patient/Family education, Self Care, Dry Needling, Electrical stimulation, Cryotherapy, Moist heat, Ultrasound, and Manual therapy.  PLAN FOR NEXT SESSION: Possible dry needling, Combo e'stim/US at 1.50 W/CM2, STW/M. Core exercise progression, spinal protection techniques and body mechanics training.    Carrell Rahmani, [place], PT 10/09/2022, 2:54 PM

## 2022-10-11 ENCOUNTER — Encounter: Payer: Medicare Other | Admitting: Physical Therapy

## 2022-11-12 ENCOUNTER — Encounter: Payer: Self-pay | Admitting: Cardiology

## 2022-11-13 NOTE — Telephone Encounter (Signed)
both drugs (Repatha and Praluent) are effective in reducing cardiovascular risks and effective in treating high cholesterol. Side effect are not much different either so I would suggest trying Repatha if that is your insurance preferred [id number] agent

## 2022-11-27 ENCOUNTER — Telehealth: Payer: Self-pay | Admitting: Cardiology

## 2022-11-27 NOTE — Telephone Encounter (Signed)
Pt c/o Shortness Of Breath: STAT if SOB developed within the last 24 hours or pt is noticeably SOB on the phone  1. Are you currently SOB (can you hear that pt is SOB on the phone)? N/A  2. How long have you been experiencing SOB? A few weeks   3. Are you SOB when sitting or when up moving around? off & on with mild exertion  4. Are you currently experiencing any other symptoms? Slight rare tightness in chest relieved with rest.    Answers received via patient schedule.

## 2022-11-27 NOTE — Telephone Encounter (Signed)
She had just received letter to make her annual appointment and she had to answer these questions, then it prompted [place] to call her  She reports these symptoms have been ongoing. These are not new symptoms, she is being seen at the pain clinic for back pain and finds herself holding her breath at times due to pain.   Offered her an appt with an APP within the week, but she wants to just make appt with Dr Herbie [place] in February. Made annual appt with Dr Herbie [place]. Given ER precautions and told her to let us know if she feels she needs to be seen earlier. She verbalized understanding

## 2023-01-31 ENCOUNTER — Telehealth: Payer: Self-pay | Admitting: Cardiology

## 2023-01-31 MED ORDER — REPATHA SURECLICK 140 MG/ML ~~LOC~~ SOAJ: 140.0000 mg | SUBCUTANEOUS | 3 refills | Status: DC

## 2023-01-31 NOTE — Telephone Encounter (Signed)
Pt c/o medication issue:  1. Name of Medication: as of 02-06-23 Praluent will no longer be covered by patient;s insurance  2. How are you currently taking this medication (dosage and times per day)?   3. Are you having a reaction (difficulty breathing--STAT)?   4. What is your medication issue?  The alternative for Praluent will be Repatha   When the new prescription is sent for Repatha, please put a note to cx the prescription for Praluent

## 2023-03-29 ENCOUNTER — Ambulatory Visit: Payer: Medicare Other | Attending: Cardiology | Admitting: Cardiology

## 2023-03-29 ENCOUNTER — Encounter: Payer: Self-pay | Admitting: Cardiology

## 2023-03-29 VITALS — BP 112/72 | HR 62 | Ht 61.0 in | Wt 217.4 lb

## 2023-03-29 DIAGNOSIS — I10 Essential (primary) hypertension: Secondary | ICD-10-CM

## 2023-03-29 DIAGNOSIS — I200 Unstable angina: Secondary | ICD-10-CM

## 2023-03-29 DIAGNOSIS — I493 Ventricular premature depolarization: Secondary | ICD-10-CM | POA: Diagnosis not present

## 2023-03-29 DIAGNOSIS — D692 Other nonthrombocytopenic purpura: Secondary | ICD-10-CM

## 2023-03-29 DIAGNOSIS — T466X5D Adverse effect of antihyperlipidemic and antiarteriosclerotic drugs, subsequent encounter: Secondary | ICD-10-CM

## 2023-03-29 DIAGNOSIS — M533 Sacrococcygeal disorders, not elsewhere classified: Secondary | ICD-10-CM

## 2023-03-29 DIAGNOSIS — E785 Hyperlipidemia, unspecified: Secondary | ICD-10-CM

## 2023-03-29 DIAGNOSIS — T466X5A Adverse effect of antihyperlipidemic and antiarteriosclerotic drugs, initial encounter: Secondary | ICD-10-CM

## 2023-03-29 DIAGNOSIS — I2510 Atherosclerotic heart disease of native coronary artery without angina pectoris: Secondary | ICD-10-CM | POA: Diagnosis not present

## 2023-03-29 DIAGNOSIS — R002 Palpitations: Secondary | ICD-10-CM

## 2023-03-29 DIAGNOSIS — Z9861 Coronary angioplasty status: Secondary | ICD-10-CM

## 2023-03-29 DIAGNOSIS — M791 Myalgia, unspecified site: Secondary | ICD-10-CM

## 2023-03-29 NOTE — Progress Notes (Signed)
 Cardiology Office Note:  .   Date:  03/31/2023  ID:  Kiara Miller, DOB 1950-03-17, MRN [id number] PCP: Tracey Harries, MD  [facility] Providers Cardiologist:  Bryan Lemma, MD     Chief Complaint  Patient presents with   Follow-up    Annual follow-up.  No active cardiac issues.   Coronary Artery Disease    No active angina    Patient Profile: .     Kiara Miller is a sedentary/morbidly obese 73 y.o. female  with a PMH notable for CAD (LAD PCI in August 2021 for progressive angina), HTN, HLD (statin intolerance/myopathy-on recently switched from Praluent to Repatha due to insurance), frequent PACs, RA, chronic fatigue & chronic coccydynia that limits her activity who presents here for annual follow-up at the request of Tracey Harries, MD.  CAD:  Sx -extreme fatigue with progressive worsening exertional dyspnea and anginal chest discomfort/pressure Cor CTA (09/28/2019): CAC 537.  Moderate aortic sclerosis.  Mild calcified plaque (25 to 49%) proximal LAD, mid LAD moderate to severe plaque between D1 and D2 (50 to 69%) => FFRCT mid LAD 0.66, distal LAD 0.63. ->  Physiologically significant mid LAD lesion. Cath-PCI (10/05/2019): Mid LAD 85% stenosis => DES PCI with resolute Onyx 2.5 x 15 mm stent => 2.8 mm.  Mid LCx 60%.  Proximal to mid RCA 65%, mid RCA 45%.  RPL to 65%.  Normal LVEF and normal EDP.  With negative CT FFR findings on the RCA and LCx, and these will be treated medically.    Kiara Miller was last seen on March 16, 2022-doing fairly well.  Limited by back and joint pain the radiates down her hip and buttock.  Mild bruising but no active angina or exertional dyspnea besides her baseline dyspnea related to deconditioning/weight.  Unfortunately, she continued put on weight.  No med changes.  Tried to encourage finding some type of physical activity.  Subjective  Discussed the use of AI scribe software for clinical note transcription with the patient, who gave verbal  consent to proceed.  History of Present Illness   Kiara Miller is a 73 year old female with coronary artery disease who presents for a cardiology follow-up.  She has a history of coronary artery disease with a significant lesion in the left anterior descending artery, for which a stent was placed following a coronary CTA in 2021. Prior to the procedure, she experienced chest pain and shortness of breath, both of which have improved post-stenting.   She is currently asymptomatic for angina and is on a medication regimen including Plavix, Toprol XL (12.5 mg), Losartan HCTZ (100/25 mg), and Repatha (140 mg Inj weekly), which she started in January due to insurance changes. No recent hospitalizations or surgeries have occurred.  No further episodes of chest pain or pressure at rest or with exertion.  She does have exertional dyspnea mostly because of deconditioning but no worse than before.  No PND, orthopnea with trivial lower extremity.  She experiences occasional dizziness, particularly when bending over and standing up, accompanied by a sensation of losing balance, feeling as if she is falling forward or backward. No heart racing, skipping, flipping, syncope, or falls are reported.  She suffers from coccydynia, causing significant discomfort and limiting her ability to sit comfortably. Various cushions have been tried to alleviate the pain. The condition affects her mobility, making it difficult to walk or exercise, and she often relies on a grocery cart for support when shopping. Her weight has  increased slightly since last year, attributed to her limited ability to exercise due to back pain.  No blood in stools, urine, or epistaxis. No swelling in her legs or shortness of breath when lying flat. She does not use CPAP and does not wake up short of breath at night. She experiences nasal congestion at night, which sometimes makes her feel like she is choking. She does not have access to a pool for  water exercises.        Objective   Current Meds  Medication Sig   azelastine (ASTELIN) 0.1 % nasal spray Place 2 sprays into both nostrils as needed for allergies.   Cholecalciferol (VITAMIN D3) 1.25 MG (50000 UT) CAPS Take 1 capsule by mouth once a week.   clopidogrel (PLAVIX) 75 MG tablet TAKE 1 TABLET DAILY   escitalopram (LEXAPRO) 20 MG tablet Take 1 tablet by mouth daily.   Evolocumab (REPATHA SURECLICK) 140 MG/ML SOAJ Inject 140 mg into the skin every 14 (fourteen) days.   fluticasone (FLONASE) 50 MCG/ACT nasal spray Place 2 sprays into both nostrils as needed for allergies.   gabapentin (NEURONTIN) 300 MG capsule Take 300 mg by mouth daily at 6 (six) AM.   ibuprofen (ADVIL) 200 MG tablet Take 800 mg by mouth 2 (two) times daily as needed for headache or moderate pain.   ketoconazole (NIZORAL) 2 % cream Apply 1 application  topically daily as needed for irritation.   levothyroxine (SYNTHROID, LEVOTHROID) 75 MCG tablet Take 75 mcg by mouth daily before breakfast.   losartan-hydrochlorothiazide (HYZAAR) 100-25 MG per tablet Take 1 tablet by mouth daily.   metoprolol succinate (TOPROL-XL) 25 MG 24 hr tablet TAKE 1/2 TABLET ONCE DAILY   montelukast (SINGULAIR) 10 MG tablet Take 10 mg by mouth at bedtime.   tiZANidine (ZANAFLEX) 2 MG tablet Take 2 mg by mouth as needed for muscle spasms.    Studies Reviewed: Marland Kitchen   EKG Interpretation Date/Time:  Friday March 29 2023 13:42:12 EST Ventricular Rate:  62 PR Interval:  156 QRS Duration:  92 QT Interval:  412 QTC Calculation: 418 R Axis:   -62  Text Interpretation: Normal sinus rhythm Pulmonary disease pattern Left anterior fascicular block When compared with ECG of 05-Oct-2019 17:46, No significant change was found Confirmed by Bryan Lemma ([id number]) on 03/29/2023 1:57:01 PM       [facility] Related to Lipid Panel With LDL/HDL Ratio Component 01/09/23 07/05/22 01/04/22  Cholesterol, Total 126 141 135  Triglycerides 115 101 104   HDL 51 59 48  VLDL Cholesterol Cal 21 19 19   LDL 54 63 68   Cath-PCI (09/2019): DES PCI mLAD 85% (resolute Onyx 2.5 mm x 15 mm postdilated 2.8 mm)   Risk Assessment/Calculations:         Physical Exam:   VS:  BP 112/72 (BP Location: Left Arm, Patient Position: Sitting, Cuff Size: Large)   Pulse 62   Ht 5\' 1"  (1.549 m)   Wt 217 lb 6.4 oz (98.6 kg)   BMI 41.08 kg/m    Wt Readings from Last 3 Encounters:  03/29/23 217 lb 6.4 oz (98.6 kg)  03/16/22 209 lb 9.6 oz (95.1 kg)  01/23/21 201 lb 6.4 oz (91.4 kg)  Additional weight gain-very deconditioned. GEN: Well nourished, well developed in no acute distress; morbidly obese NECK: No JVD; No carotid bruits CARDIAC: RRR, distant S1, S2; no murmurs, rubs, gallops; occasional ectopy RESPIRATORY:  Clear to auscultation without rales, wheezing or rhonchi ; nonlabored, good air movement.  ABDOMEN: Soft, non-tender, non-distended EXTREMITIES:  No edema; No deformity     ASSESSMENT AND PLAN: .    Problem List Items Addressed This Visit       Cardiology Problems   CAD S/P percutaneous coronary angioplasty (Chronic)   Progressive Angina resolved after PCI to LAD in 2021.  No new symptoms of chest pain or shortness of breath. EKG stable compared to prior. -Continue Plavix, Toprolol 12.5mg , Losartan HCTZ 100/25mg , and Repatha 140 mg Inj weekly -Consider holding Plavix for a couple of days in case of significant bruising. -Consider taking Plavix off on Sundays and Wednesdays to reduce bruising.      Essential hypertension - Primary (Chronic)   Relevant Orders   EKG 12-Lead (Completed)   Hyperlipidemia with target low density lipoprotein (LDL) cholesterol less than 55 mg/dL (Chronic)   Switched from Praluent to Repatha in January due to insurance coverage. No side effects reported. -Continue Repatha 140 mg Inj weekly -Check lipid panel in June during follow-up with primary care provider.      Progressive angina (HCC)   History of  progressive angina - with no further Sx following LAD PCI.      PVC's (premature ventricular contractions) (Chronic)   Ectopy noted on exam, but not seen on EKG and relatively asymptomatic on current low-dose Toprol 12.5 mg daily.  With resting heart of 60, not able to titrate further. -Continue current dose of Toprol XL 12.5 mg daily.      Senile purpura (HCC) (Chronic)   Significant bruising on Plavix. If she has a large bruise, okay to hold Plavix for 2 to 3 days. -Consider taking Plavix off on Sundays and Wednesdays to reduce bruising. - Could consider converting from Plavix to aspirin        Other   Coccydynia   Severe pain affecting mobility and ability to exercise. -Encouraged to find ways to exercise that do not exacerbate pain, such as water walking or using a pedal bike.      Morbid obesity (HCC) (Chronic)   Unfortunately, she continues to gain weight despite the regimen he attempts to adjust diet previously not able to exercise. Would suggest that PCP consider the possibility of GLP-1 agonist either Wegovy or Zepbound.      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 Now on Repatha - changed from Praluent 2/2 insurance      Palpitations (Chronic)   Stable on Low dose Beta Blocker -  - Continue Toprol XL 12.5 mg daily (unable to titrate further)        Follow-up in 1 year unless new symptoms arise.  Return in about 1 year (around 03/28/2024) for Routine follow up with me, [employer].     Signed, Marykay Lex, MD, MS Bryan Lemma, M.D., M.S. Interventional Cardiologist  [facility]  Pager # [phone] Phone # [phone] [address] [place], [place] [address]

## 2023-03-29 NOTE — Patient Instructions (Signed)

## 2023-03-31 ENCOUNTER — Encounter: Payer: Self-pay | Admitting: Cardiology

## 2023-03-31 DIAGNOSIS — M533 Sacrococcygeal disorders, not elsewhere classified: Secondary | ICD-10-CM | POA: Insufficient documentation

## 2023-03-31 DIAGNOSIS — T466X5A Adverse effect of antihyperlipidemic and antiarteriosclerotic drugs, initial encounter: Secondary | ICD-10-CM | POA: Insufficient documentation

## 2023-03-31 NOTE — Assessment & Plan Note (Signed)
 Progressive Angina resolved after PCI to LAD in 2021.  No new symptoms of chest pain or shortness of breath. EKG stable compared to prior. -Continue Plavix, Toprolol 12.5mg , Losartan HCTZ 100/25mg , and Repatha 140 mg Inj weekly -Consider holding Plavix for a couple of days in case of significant bruising. -Consider taking Plavix off on Sundays and Wednesdays to reduce bruising.

## 2023-03-31 NOTE — Assessment & Plan Note (Signed)
 Significant myalgias from multiple different statins: simvastatin, atorvastatin & rosuvastatin. Now on Repatha - changed from Praluent 2/2 insurance

## 2023-03-31 NOTE — Assessment & Plan Note (Signed)
 Ectopy noted on exam, but not seen on EKG and relatively asymptomatic on current low-dose Toprol 12.5 mg daily.  With resting heart of 60, not able to titrate further. -Continue current dose of Toprol XL 12.5 mg daily.

## 2023-03-31 NOTE — Assessment & Plan Note (Signed)
 Switched from [employer] to Repatha in January due to insurance coverage. No side effects reported. -Continue Repatha 140 mg Inj weekly -Check lipid panel in June during follow-up with primary care provider.

## 2023-03-31 NOTE — Assessment & Plan Note (Signed)
 History of progressive angina - with no further Sx following LAD PCI.

## 2023-03-31 NOTE — Assessment & Plan Note (Signed)
 Stable on Low dose Beta Blocker -  - Continue Toprol XL 12.5 mg daily (unable to titrate further)

## 2023-03-31 NOTE — Assessment & Plan Note (Addendum)
 Significant bruising on Plavix. If she has a large bruise, okay to hold Plavix for 2 to 3 days. -Consider taking Plavix off on Sundays and Wednesdays to reduce bruising. - Could consider converting from Plavix to aspirin

## 2023-03-31 NOTE — Assessment & Plan Note (Signed)
 Unfortunately, she continues to gain weight despite the regimen he attempts to adjust diet previously not able to exercise. Would suggest that PCP consider the possibility of GLP-1 agonist either Wegovy or Zepbound.

## 2023-03-31 NOTE — Assessment & Plan Note (Signed)
 Severe pain affecting mobility and ability to exercise. -Encouraged to find ways to exercise that do not exacerbate pain, such as water walking or using a pedal bike.

## 2023-06-17 ENCOUNTER — Other Ambulatory Visit: Payer: Self-pay | Admitting: Cardiology

## 2023-07-23 ENCOUNTER — Telehealth: Payer: Self-pay

## 2023-07-23 NOTE — Telephone Encounter (Signed)
   Pre-operative Risk Assessment    Patient Name: Kiara Miller  DOB: 1951-01-10 MRN: [id number]   Date of last office visit: 03/29/23 - Dr. Addie Holstein  Date of next office visit: TBD   Request for Surgical Clearance    Procedure:  INTRACEPT L3-S1  Date of Surgery:  Clearance 08/16/23                                Surgeon:  DR. Domenica Fried Surgeon's Group or Practice Name:  [facility]  Phone number:  [phone] Fax number:  [phone]   Type of Clearance Requested:   - Pharmacy:  Hold Clopidogrel  (Plavix ) 7 DAYS PRIOR AD 24 HOURS AFTER   Type of Anesthesia:  Not Indicated   Additional requests/questions:    SignedVira Grieves   07/23/2023, 1:07 PM

## 2023-07-24 NOTE — Telephone Encounter (Signed)
   Name: Kiara Miller  DOB: 05-Jul-1950  MRN: [id number]   Primary Cardiologist: Randene Bustard, MD  Chart reviewed as part of pre-operative protocol coverage.   We have been asked for guidance to hold oral antiplatelet for upcoming procedure.   She is s/p PCI 2021 and remains on plavix  monotherapy. Given remaining nonobstructive CAD, would prefer ASA during plavix  hold, if possible. If not, may hold antiplatelets for 5-7 days.   I will route this recommendation to the requesting party via Epic fax function and remove from pre-op pool. Please call with questions.  Warren Haber Zay Yeargan, PA 07/24/2023, 9:35 AM

## 2023-07-30 NOTE — Telephone Encounter (Signed)
 2nd Preop clearance request received. Will route notes to the surgeons office.

## 2023-08-07 ENCOUNTER — Encounter: Payer: Self-pay | Admitting: Cardiology

## 2023-08-15 NOTE — Telephone Encounter (Signed)
 Cholesterol, Total 100 - 199 mg/dL 879   Triglycerides 0 - 149 mg/dL 866   HDL >60 mg/dL 43   VLDL Cholesterol Cal 5 - 40 mg/dL 23   LDL 0 - 99 mg/dL 54   LDL/HDL Ratio 0.0 - 3.2 ratio 1.3    Lipid panel from PCP reviewed.  Outstanding.  LDL is within goal.  Alm Clay, MD

## 2023-08-15 NOTE — Telephone Encounter (Signed)
 Repatha  looks like it is working great.

## 2024-01-06 ENCOUNTER — Other Ambulatory Visit: Payer: Self-pay | Admitting: Cardiology

## 2024-02-13 ENCOUNTER — Other Ambulatory Visit: Payer: Self-pay | Admitting: Adult Health Nurse Practitioner

## 2024-02-13 DIAGNOSIS — Z1231 Encounter for screening mammogram for malignant neoplasm of breast: Secondary | ICD-10-CM

## 2024-02-25 ENCOUNTER — Ambulatory Visit
Admission: RE | Admit: 2024-02-25 | Discharge: 2024-02-25 | Disposition: A | Source: Ambulatory Visit | Attending: Adult Health Nurse Practitioner | Admitting: Adult Health Nurse Practitioner

## 2024-02-25 DIAGNOSIS — Z1231 Encounter for screening mammogram for malignant neoplasm of breast: Secondary | ICD-10-CM

## 2024-04-06 ENCOUNTER — Ambulatory Visit: Admitting: Cardiology
